# Patient Record
Sex: Female | Born: 1955 | Race: White | Hispanic: No | Marital: Married | Smoking: Current every day smoker
Health system: Southern US, Community
[De-identification: ages and names within clinical notes are randomized; demographics above are authoritative.]

## PROBLEM LIST (undated history)

## (undated) DIAGNOSIS — N921 Excessive and frequent menstruation with irregular cycle: Secondary | ICD-10-CM

## (undated) DIAGNOSIS — I10 Essential (primary) hypertension: Secondary | ICD-10-CM

## (undated) DIAGNOSIS — E876 Hypokalemia: Secondary | ICD-10-CM

## (undated) DIAGNOSIS — G459 Transient cerebral ischemic attack, unspecified: Secondary | ICD-10-CM

## (undated) DIAGNOSIS — E781 Pure hyperglyceridemia: Secondary | ICD-10-CM

## (undated) DIAGNOSIS — M1990 Unspecified osteoarthritis, unspecified site: Secondary | ICD-10-CM

## (undated) DIAGNOSIS — R51 Headache: Secondary | ICD-10-CM

## (undated) DIAGNOSIS — R112 Nausea with vomiting, unspecified: Secondary | ICD-10-CM

## (undated) DIAGNOSIS — Z9889 Other specified postprocedural states: Secondary | ICD-10-CM

## (undated) DIAGNOSIS — E669 Obesity, unspecified: Secondary | ICD-10-CM

## (undated) DIAGNOSIS — Z720 Tobacco use: Secondary | ICD-10-CM

## (undated) DIAGNOSIS — R519 Headache, unspecified: Secondary | ICD-10-CM

## (undated) DIAGNOSIS — C801 Malignant (primary) neoplasm, unspecified: Secondary | ICD-10-CM

## (undated) DIAGNOSIS — R609 Edema, unspecified: Secondary | ICD-10-CM

## (undated) DIAGNOSIS — N393 Stress incontinence (female) (male): Secondary | ICD-10-CM

## (undated) DIAGNOSIS — K279 Peptic ulcer, site unspecified, unspecified as acute or chronic, without hemorrhage or perforation: Secondary | ICD-10-CM

## (undated) HISTORY — DX: Tobacco use: Z72.0

## (undated) HISTORY — DX: Excessive and frequent menstruation with irregular cycle: N92.1

## (undated) HISTORY — PX: RADIOACTIVE SEED IMPLANT: SHX5150

## (undated) HISTORY — DX: Pure hyperglyceridemia: E78.1

## (undated) HISTORY — PX: CYST EXCISION: SHX5701

## (undated) HISTORY — DX: Edema, unspecified: R60.9

## (undated) HISTORY — PX: TUBAL LIGATION: SHX77

## (undated) HISTORY — DX: Essential (primary) hypertension: I10

## (undated) HISTORY — DX: Stress incontinence (female) (male): N39.3

## (undated) HISTORY — DX: Hypokalemia: E87.6

## (undated) HISTORY — PX: ANTERIOR CRUCIATE LIGAMENT REPAIR: SHX115

## (undated) HISTORY — DX: Obesity, unspecified: E66.9

## (undated) HISTORY — DX: Peptic ulcer, site unspecified, unspecified as acute or chronic, without hemorrhage or perforation: K27.9

---

## 2000-07-01 ENCOUNTER — Emergency Department (HOSPITAL_COMMUNITY): Admission: EM | Admit: 2000-07-01 | Discharge: 2000-07-01 | Payer: Self-pay | Admitting: Emergency Medicine

## 2001-10-14 ENCOUNTER — Other Ambulatory Visit: Admission: RE | Admit: 2001-10-14 | Discharge: 2001-10-14 | Payer: Self-pay | Admitting: Internal Medicine

## 2002-10-09 ENCOUNTER — Other Ambulatory Visit: Admission: RE | Admit: 2002-10-09 | Discharge: 2002-10-09 | Payer: Self-pay | Admitting: Internal Medicine

## 2002-10-16 ENCOUNTER — Encounter: Admission: RE | Admit: 2002-10-16 | Discharge: 2002-10-16 | Payer: Self-pay | Admitting: Internal Medicine

## 2002-10-16 ENCOUNTER — Encounter: Payer: Self-pay | Admitting: Internal Medicine

## 2003-05-31 HISTORY — PX: OTHER SURGICAL HISTORY: SHX169

## 2003-11-03 ENCOUNTER — Encounter: Admission: RE | Admit: 2003-11-03 | Discharge: 2003-11-03 | Payer: Self-pay | Admitting: Internal Medicine

## 2004-06-30 ENCOUNTER — Encounter: Admission: RE | Admit: 2004-06-30 | Discharge: 2004-06-30 | Payer: Self-pay | Admitting: Internal Medicine

## 2004-11-21 ENCOUNTER — Other Ambulatory Visit: Admission: RE | Admit: 2004-11-21 | Discharge: 2004-11-21 | Payer: Self-pay | Admitting: Internal Medicine

## 2004-11-22 ENCOUNTER — Ambulatory Visit (HOSPITAL_COMMUNITY): Admission: RE | Admit: 2004-11-22 | Discharge: 2004-11-22 | Payer: Self-pay | Admitting: Pediatrics

## 2006-01-14 ENCOUNTER — Other Ambulatory Visit: Admission: RE | Admit: 2006-01-14 | Discharge: 2006-01-14 | Payer: Self-pay | Admitting: Internal Medicine

## 2006-01-23 ENCOUNTER — Encounter: Admission: RE | Admit: 2006-01-23 | Discharge: 2006-01-23 | Payer: Self-pay | Admitting: Internal Medicine

## 2006-10-29 HISTORY — PX: OTHER SURGICAL HISTORY: SHX169

## 2006-11-11 ENCOUNTER — Inpatient Hospital Stay (HOSPITAL_COMMUNITY): Admission: EM | Admit: 2006-11-11 | Discharge: 2006-11-18 | Payer: Self-pay | Admitting: Emergency Medicine

## 2007-04-24 ENCOUNTER — Other Ambulatory Visit: Admission: RE | Admit: 2007-04-24 | Discharge: 2007-04-24 | Payer: Self-pay | Admitting: Internal Medicine

## 2007-05-22 ENCOUNTER — Encounter: Admission: RE | Admit: 2007-05-22 | Discharge: 2007-05-22 | Payer: Self-pay | Admitting: Internal Medicine

## 2008-04-27 ENCOUNTER — Other Ambulatory Visit: Admission: RE | Admit: 2008-04-27 | Discharge: 2008-04-27 | Payer: Self-pay | Admitting: Internal Medicine

## 2008-04-27 LAB — HM PAP SMEAR

## 2008-05-18 ENCOUNTER — Ambulatory Visit: Payer: Self-pay | Admitting: Internal Medicine

## 2008-06-07 ENCOUNTER — Encounter: Admission: RE | Admit: 2008-06-07 | Discharge: 2008-06-07 | Payer: Self-pay | Admitting: Internal Medicine

## 2008-11-09 ENCOUNTER — Ambulatory Visit: Payer: Self-pay | Admitting: Internal Medicine

## 2008-12-07 ENCOUNTER — Ambulatory Visit: Payer: Self-pay | Admitting: Internal Medicine

## 2009-02-01 ENCOUNTER — Ambulatory Visit: Payer: Self-pay | Admitting: Internal Medicine

## 2009-03-01 ENCOUNTER — Ambulatory Visit: Payer: Self-pay | Admitting: Internal Medicine

## 2009-03-28 ENCOUNTER — Ambulatory Visit: Payer: Self-pay | Admitting: Internal Medicine

## 2009-04-29 ENCOUNTER — Ambulatory Visit: Payer: Self-pay | Admitting: Internal Medicine

## 2009-11-07 ENCOUNTER — Ambulatory Visit: Payer: Self-pay | Admitting: Internal Medicine

## 2009-11-16 ENCOUNTER — Encounter: Admission: RE | Admit: 2009-11-16 | Discharge: 2009-11-16 | Payer: Self-pay | Admitting: Internal Medicine

## 2009-12-08 ENCOUNTER — Ambulatory Visit: Payer: Self-pay | Admitting: Internal Medicine

## 2010-03-20 ENCOUNTER — Ambulatory Visit: Payer: Self-pay | Admitting: Internal Medicine

## 2010-07-20 ENCOUNTER — Ambulatory Visit: Payer: Self-pay | Admitting: Internal Medicine

## 2010-07-27 ENCOUNTER — Ambulatory Visit: Payer: Self-pay | Admitting: Internal Medicine

## 2010-08-20 ENCOUNTER — Encounter: Payer: Self-pay | Admitting: Internal Medicine

## 2010-11-07 ENCOUNTER — Other Ambulatory Visit (INDEPENDENT_AMBULATORY_CARE_PROVIDER_SITE_OTHER): Payer: PRIVATE HEALTH INSURANCE | Admitting: Internal Medicine

## 2010-11-07 DIAGNOSIS — E785 Hyperlipidemia, unspecified: Secondary | ICD-10-CM

## 2010-11-07 DIAGNOSIS — E119 Type 2 diabetes mellitus without complications: Secondary | ICD-10-CM

## 2010-11-07 DIAGNOSIS — I10 Essential (primary) hypertension: Secondary | ICD-10-CM

## 2010-11-09 ENCOUNTER — Encounter: Payer: PRIVATE HEALTH INSURANCE | Admitting: Internal Medicine

## 2010-12-08 ENCOUNTER — Telehealth: Payer: Self-pay | Admitting: Internal Medicine

## 2010-12-08 DIAGNOSIS — K279 Peptic ulcer, site unspecified, unspecified as acute or chronic, without hemorrhage or perforation: Secondary | ICD-10-CM

## 2010-12-08 MED ORDER — PANTOPRAZOLE SODIUM 40 MG PO TBEC: 40.0000 mg | DELAYED_RELEASE_TABLET | Freq: Every day | ORAL | Status: DC

## 2010-12-08 NOTE — Telephone Encounter (Signed)
Medication reordered by MD

## 2011-01-05 ENCOUNTER — Other Ambulatory Visit: Payer: Self-pay | Admitting: Internal Medicine

## 2011-01-05 DIAGNOSIS — Z1231 Encounter for screening mammogram for malignant neoplasm of breast: Secondary | ICD-10-CM

## 2011-01-16 ENCOUNTER — Ambulatory Visit
Admission: RE | Admit: 2011-01-16 | Discharge: 2011-01-16 | Disposition: A | Discharge status: Home / Self Care | Payer: PRIVATE HEALTH INSURANCE | Source: Ambulatory Visit | Attending: Internal Medicine | Admitting: Internal Medicine

## 2011-01-16 DIAGNOSIS — Z1231 Encounter for screening mammogram for malignant neoplasm of breast: Secondary | ICD-10-CM

## 2011-01-19 ENCOUNTER — Other Ambulatory Visit: Payer: Self-pay | Admitting: Internal Medicine

## 2011-01-19 DIAGNOSIS — R928 Other abnormal and inconclusive findings on diagnostic imaging of breast: Secondary | ICD-10-CM

## 2011-01-26 ENCOUNTER — Ambulatory Visit
Admission: RE | Admit: 2011-01-26 | Discharge: 2011-01-26 | Disposition: A | Discharge status: Home / Self Care | Payer: PRIVATE HEALTH INSURANCE | Source: Ambulatory Visit | Attending: Internal Medicine | Admitting: Internal Medicine

## 2011-01-26 DIAGNOSIS — R928 Other abnormal and inconclusive findings on diagnostic imaging of breast: Secondary | ICD-10-CM

## 2011-04-05 ENCOUNTER — Other Ambulatory Visit: Payer: Self-pay | Admitting: Internal Medicine

## 2011-05-09 ENCOUNTER — Encounter: Payer: Self-pay | Admitting: Internal Medicine

## 2011-05-14 ENCOUNTER — Other Ambulatory Visit: Payer: PRIVATE HEALTH INSURANCE | Admitting: Internal Medicine

## 2011-05-14 DIAGNOSIS — E785 Hyperlipidemia, unspecified: Secondary | ICD-10-CM

## 2011-05-14 DIAGNOSIS — E119 Type 2 diabetes mellitus without complications: Secondary | ICD-10-CM

## 2011-05-14 LAB — HEMOGLOBIN A1C
Hgb A1c MFr Bld: 5.5 % (ref <5.7)
Mean Plasma Glucose: 111 mg/dL (ref <117)

## 2011-05-14 LAB — HEPATIC FUNCTION PANEL
ALT: 39 U/L — ABNORMAL HIGH (ref 0–35)
Albumin: 4.2 g/dL (ref 3.5–5.2)
Bilirubin, Direct: 0.1 mg/dL (ref 0.0–0.3)
Total Bilirubin: 0.6 mg/dL (ref 0.3–1.2)
Total Protein: 6.8 g/dL (ref 6.0–8.3)

## 2011-05-14 LAB — LIPID PANEL
Cholesterol: 141 mg/dL (ref 0–200)
LDL Cholesterol: 78 mg/dL (ref 0–99)
Triglycerides: 132 mg/dL (ref <150)

## 2011-05-15 ENCOUNTER — Ambulatory Visit (INDEPENDENT_AMBULATORY_CARE_PROVIDER_SITE_OTHER): Payer: PRIVATE HEALTH INSURANCE | Admitting: Internal Medicine

## 2011-05-15 ENCOUNTER — Encounter: Payer: Self-pay | Admitting: Internal Medicine

## 2011-05-15 DIAGNOSIS — R7302 Impaired glucose tolerance (oral): Secondary | ICD-10-CM

## 2011-05-15 DIAGNOSIS — I10 Essential (primary) hypertension: Secondary | ICD-10-CM

## 2011-05-15 DIAGNOSIS — Z8711 Personal history of peptic ulcer disease: Secondary | ICD-10-CM

## 2011-05-15 DIAGNOSIS — E785 Hyperlipidemia, unspecified: Secondary | ICD-10-CM

## 2011-05-15 DIAGNOSIS — R7309 Other abnormal glucose: Secondary | ICD-10-CM

## 2011-05-15 DIAGNOSIS — E669 Obesity, unspecified: Secondary | ICD-10-CM

## 2011-05-27 DIAGNOSIS — E669 Obesity, unspecified: Secondary | ICD-10-CM | POA: Insufficient documentation

## 2011-05-27 DIAGNOSIS — E785 Hyperlipidemia, unspecified: Secondary | ICD-10-CM | POA: Insufficient documentation

## 2011-05-27 DIAGNOSIS — Z8711 Personal history of peptic ulcer disease: Secondary | ICD-10-CM | POA: Insufficient documentation

## 2011-05-27 DIAGNOSIS — I10 Essential (primary) hypertension: Secondary | ICD-10-CM | POA: Insufficient documentation

## 2011-05-27 DIAGNOSIS — R7302 Impaired glucose tolerance (oral): Secondary | ICD-10-CM | POA: Insufficient documentation

## 2011-05-27 NOTE — Patient Instructions (Signed)
Try to continue to diet and exercise and lose some weight. You may contact [facility] regarding their bariatric surgery program. Continue same medications.

## 2011-05-27 NOTE — Progress Notes (Signed)
  Subjective:    Patient ID: Gabriella [name], female    DOB: 02/19/1956, 55 y.o.   MRN: [id number]  HPI 55 year old married white female with history of obesity, hypertension, hyperlipidemia, impaired glucose tolerance for six-month recheck. She's asking about bariatric surgery. Says she's been unable to lose weight with diet. Doesn't exercise a great deal. 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 Has always been sedentary since she's been coming to this office. Recent lipid panel, liver functions and hemoglobin A1c are all quite good. These were discussed with her today. However she has many questions about area tricks surgery. Explained to her she would need contact [facility] and sign up for seminars.    Review of Systems     Objective:   Physical Exam chest clear; cardiac exam regular rate and rhythm; extremities without edema        Assessment & Plan:  Hypertension  Hyperlipidemia  Impaired glucose tolerance  Obesity  History of peptic ulcer disease- status post perforated ulcer requiring surgery by Dr. Zachery Dakins  Plan return in 6 months for physical examination, evaluation of medical problems and fasting lab work

## 2011-11-06 ENCOUNTER — Other Ambulatory Visit: Payer: Self-pay | Admitting: Internal Medicine

## 2011-11-07 ENCOUNTER — Other Ambulatory Visit: Payer: Self-pay

## 2011-11-07 MED ORDER — HYDROCODONE-ACETAMINOPHEN 5-500 MG PO TABS: 1.0000 | ORAL_TABLET | Freq: Four times a day (QID) | ORAL | Status: AC | PRN

## 2011-11-19 ENCOUNTER — Other Ambulatory Visit: Payer: PRIVATE HEALTH INSURANCE | Admitting: Internal Medicine

## 2011-11-19 DIAGNOSIS — E785 Hyperlipidemia, unspecified: Secondary | ICD-10-CM

## 2011-11-19 DIAGNOSIS — E559 Vitamin D deficiency, unspecified: Secondary | ICD-10-CM

## 2011-11-19 DIAGNOSIS — I10 Essential (primary) hypertension: Secondary | ICD-10-CM

## 2011-11-19 DIAGNOSIS — Z0000 Encounter for general adult medical examination without abnormal findings: Secondary | ICD-10-CM

## 2011-11-19 DIAGNOSIS — R7302 Impaired glucose tolerance (oral): Secondary | ICD-10-CM

## 2011-11-19 LAB — CBC WITH DIFFERENTIAL/PLATELET
Basophils Relative: 1 % (ref 0–1)
Eosinophils Absolute: 0.1 10*3/uL (ref 0.0–0.7)
Eosinophils Relative: 1 % (ref 0–5)
Lymphocytes Relative: 42 % (ref 12–46)
MCH: 32.5 pg (ref 26.0–34.0)
MCHC: 33.4 g/dL (ref 30.0–36.0)
Monocytes Absolute: 0.6 10*3/uL (ref 0.1–1.0)
Neutrophils Relative %: 47 % (ref 43–77)
Platelets: 221 10*3/uL (ref 150–400)
WBC: 6.5 10*3/uL (ref 4.0–10.5)

## 2011-11-19 LAB — COMPREHENSIVE METABOLIC PANEL
BUN: 18 mg/dL (ref 6–23)
CO2: 30 mEq/L (ref 19–32)
Chloride: 103 mEq/L (ref 96–112)
Sodium: 142 mEq/L (ref 135–145)

## 2011-11-19 LAB — TSH: TSH: 1.245 u[IU]/mL (ref 0.350–4.500)

## 2011-11-19 LAB — LIPID PANEL
Cholesterol: 155 mg/dL (ref 0–200)
Triglycerides: 132 mg/dL (ref <150)

## 2011-11-20 ENCOUNTER — Encounter: Payer: PRIVATE HEALTH INSURANCE | Admitting: Internal Medicine

## 2011-11-20 LAB — VITAMIN D 25 HYDROXY (VIT D DEFICIENCY, FRACTURES): Vit D, 25-Hydroxy: 38 ng/mL (ref 30–89)

## 2011-12-13 ENCOUNTER — Other Ambulatory Visit: Payer: Self-pay | Admitting: Internal Medicine

## 2011-12-31 ENCOUNTER — Other Ambulatory Visit: Payer: Self-pay | Admitting: Internal Medicine

## 2011-12-31 DIAGNOSIS — Z1231 Encounter for screening mammogram for malignant neoplasm of breast: Secondary | ICD-10-CM

## 2012-01-07 ENCOUNTER — Ambulatory Visit (INDEPENDENT_AMBULATORY_CARE_PROVIDER_SITE_OTHER): Payer: PRIVATE HEALTH INSURANCE | Admitting: Internal Medicine

## 2012-01-07 ENCOUNTER — Other Ambulatory Visit (HOSPITAL_COMMUNITY)
Admission: RE | Admit: 2012-01-07 | Discharge: 2012-01-07 | Disposition: A | Discharge status: Home / Self Care | Payer: PRIVATE HEALTH INSURANCE | Source: Ambulatory Visit | Attending: Internal Medicine | Admitting: Internal Medicine

## 2012-01-07 ENCOUNTER — Encounter: Payer: Self-pay | Admitting: Internal Medicine

## 2012-01-07 VITALS — BP 144/88 | HR 68 | Resp 18 | Ht 64.0 in | Wt 255.0 lb

## 2012-01-07 DIAGNOSIS — Z0000 Encounter for general adult medical examination without abnormal findings: Secondary | ICD-10-CM

## 2012-01-07 DIAGNOSIS — Z01419 Encounter for gynecological examination (general) (routine) without abnormal findings: Secondary | ICD-10-CM | POA: Insufficient documentation

## 2012-01-07 DIAGNOSIS — R7309 Other abnormal glucose: Secondary | ICD-10-CM

## 2012-01-07 DIAGNOSIS — Z23 Encounter for immunization: Secondary | ICD-10-CM

## 2012-01-07 DIAGNOSIS — Z1272 Encounter for screening for malignant neoplasm of vagina: Secondary | ICD-10-CM

## 2012-01-07 LAB — POCT URINALYSIS DIPSTICK
Ketones, UA: NEGATIVE
Urobilinogen, UA: 1
pH, UA: 6.5

## 2012-03-31 ENCOUNTER — Encounter: Payer: Self-pay | Admitting: Internal Medicine

## 2012-03-31 NOTE — Patient Instructions (Addendum)
Consider colonoscopy. Gets annual mammogram. Consider bone density study. Gets annual influenza immunization. Return in 6 months.

## 2012-03-31 NOTE — Progress Notes (Signed)
Subjective:    Patient ID: Gabriella [name], female    DOB: 02-Sep-1955, 56 y.o.   MRN: [id number]  HPI  56 year old obese white female with history of hypertension, hyperlipidemia, impaired glucose tolerance, history of peptic ulcer disease in today for health maintenance and evaluation of medical problems. She has lost 15 pounds with diet and exercise. Patient compliant with medications and has done a good job controlling glucose intolerance.  No known drug allergies  History of smoking  Hospitalized for perforated duodenal ulcer requiring surgery April 2008. History of debridement of anterior cruciate ligament right knee November 2004 [facility] in  [place].  History of vitamin D deficiency. Last mammogram on file April 2011. Tetanus immunization done 10/14/2001. Pneumovax immunization done in may 2011.  History of recurrent urinary tract infections. Had gout April 2010. Had basal cell carcinoma of right upper d December 2008.  Patient is married to [employer] who owns a trucking business and a farm. She helps run a trucking business. They are raising her daughter son who has attention deficit disorder. Daughter had issues with substance abuse. Daughter was patient's only child.  Family history: Father with history of lung cancer. Mother with history of hypertension and dementia. Total of 5 brothers. Younger 2 brothers have history of heart murmurs.   This is patient's second marriage. Has smoked for over 30 years. No alcohol consumption. Patient is a native of [place], [place].    Review of Systems  Constitutional: Positive for fatigue.  HENT: Negative.   Eyes: Negative.   Respiratory: Negative.   Cardiovascular: Negative.   Gastrointestinal:       Intermittent reflux  Genitourinary: Positive for dysuria.  Musculoskeletal: Positive for arthralgias.  Neurological: Negative.   Hematological: Negative.   Psychiatric/Behavioral: Negative.        Objective:   Physical Exam    Vitals reviewed. Constitutional: She is oriented to person, place, and time. She appears well-developed and well-nourished. No distress.  HENT:  Head: Normocephalic and atraumatic.  Right Ear: External ear normal.  Left Ear: External ear normal.  Mouth/Throat: Oropharynx is clear and moist. No oropharyngeal exudate.  Eyes: Conjunctivae and EOM are normal. Pupils are equal, round, and reactive to light. Right eye exhibits no discharge. Left eye exhibits no discharge.  Neck: Neck supple. No JVD present. No thyromegaly present.  Cardiovascular: Normal rate, regular rhythm, normal heart sounds and intact distal pulses.   No murmur heard. Pulmonary/Chest: Effort normal and breath sounds normal. She has no wheezes. She has no rales.       Breasts normal female  Abdominal: Soft. Bowel sounds are normal. She exhibits no distension and no mass. There is no tenderness. There is no rebound and no guarding.  Musculoskeletal: She exhibits no edema.  Lymphadenopathy:    She has no cervical adenopathy.  Neurological: She is alert and oriented to person, place, and time. She has normal reflexes. No cranial nerve deficit.  Skin: Skin is warm and dry. No rash noted. She is not diaphoretic.  Psychiatric: She has a normal mood and affect. Her behavior is normal. Judgment and thought content normal.          Assessment & Plan:  Obesity-pleased with 15 pound weight loss  History of peptic ulcer disease  Hyperlipidemia  Hypokalemia secondary to diuretic therapy  History of vitamin D deficiency  Impaired glucose tolerance  Hypertension  History of dependent edema  History of smoking  History of peptic ulcer disease with perforated duodenal ulcer 2008 -  needs to stay on PPI therapy  Plan: Renew antihypertensive medications. Return in 6 months for office visit and lipid panel liver functions and hemoglobin A1c with office visit blood pressure check. Recommend annual mammogram. Recommended  annual influenza immunization. Last eye exam 2011 in [place] in [place]. Recommended annual eye exam. Needs to have screening colonoscopy.

## 2012-04-14 ENCOUNTER — Other Ambulatory Visit: Payer: Self-pay | Admitting: Internal Medicine

## 2012-04-21 ENCOUNTER — Ambulatory Visit
Admission: RE | Admit: 2012-04-21 | Discharge: 2012-04-21 | Disposition: A | Discharge status: Home / Self Care | Payer: PRIVATE HEALTH INSURANCE | Source: Ambulatory Visit | Attending: Internal Medicine | Admitting: Internal Medicine

## 2012-04-21 DIAGNOSIS — Z1231 Encounter for screening mammogram for malignant neoplasm of breast: Secondary | ICD-10-CM

## 2012-07-07 ENCOUNTER — Other Ambulatory Visit: Payer: PRIVATE HEALTH INSURANCE | Admitting: Internal Medicine

## 2012-07-08 ENCOUNTER — Ambulatory Visit: Payer: PRIVATE HEALTH INSURANCE | Admitting: Internal Medicine

## 2012-07-08 ENCOUNTER — Other Ambulatory Visit: Payer: PRIVATE HEALTH INSURANCE | Admitting: Internal Medicine

## 2012-07-08 DIAGNOSIS — E785 Hyperlipidemia, unspecified: Secondary | ICD-10-CM

## 2012-07-08 DIAGNOSIS — Z79899 Other long term (current) drug therapy: Secondary | ICD-10-CM

## 2012-07-08 DIAGNOSIS — E119 Type 2 diabetes mellitus without complications: Secondary | ICD-10-CM

## 2012-07-08 LAB — LIPID PANEL
HDL: 41 mg/dL (ref >39)
LDL Cholesterol: 88 mg/dL (ref 0–99)
Total CHOL/HDL Ratio: 3.9 Ratio
VLDL: 29 mg/dL (ref 0–40)

## 2012-07-08 LAB — HEPATIC FUNCTION PANEL
Albumin: 4.2 g/dL (ref 3.5–5.2)
Total Bilirubin: 0.6 mg/dL (ref 0.3–1.2)

## 2012-07-08 LAB — HEMOGLOBIN A1C: Hgb A1c MFr Bld: 5.5 % (ref <5.7)

## 2012-07-14 ENCOUNTER — Ambulatory Visit (INDEPENDENT_AMBULATORY_CARE_PROVIDER_SITE_OTHER): Payer: PRIVATE HEALTH INSURANCE | Admitting: Internal Medicine

## 2012-07-14 ENCOUNTER — Encounter: Payer: Self-pay | Admitting: Internal Medicine

## 2012-07-14 VITALS — BP 136/68 | HR 76 | Temp 98.8°F | Wt 259.0 lb

## 2012-07-14 DIAGNOSIS — R609 Edema, unspecified: Secondary | ICD-10-CM

## 2012-07-14 DIAGNOSIS — R600 Localized edema: Secondary | ICD-10-CM

## 2012-07-14 DIAGNOSIS — M25579 Pain in unspecified ankle and joints of unspecified foot: Secondary | ICD-10-CM

## 2012-07-14 DIAGNOSIS — R202 Paresthesia of skin: Secondary | ICD-10-CM

## 2012-07-14 DIAGNOSIS — M79609 Pain in unspecified limb: Secondary | ICD-10-CM

## 2012-07-14 DIAGNOSIS — M7660 Achilles tendinitis, unspecified leg: Secondary | ICD-10-CM

## 2012-07-14 DIAGNOSIS — I10 Essential (primary) hypertension: Secondary | ICD-10-CM

## 2012-07-14 DIAGNOSIS — R209 Unspecified disturbances of skin sensation: Secondary | ICD-10-CM

## 2012-07-14 DIAGNOSIS — E785 Hyperlipidemia, unspecified: Secondary | ICD-10-CM

## 2012-07-14 DIAGNOSIS — E119 Type 2 diabetes mellitus without complications: Secondary | ICD-10-CM

## 2012-07-14 DIAGNOSIS — Z23 Encounter for immunization: Secondary | ICD-10-CM

## 2012-07-14 DIAGNOSIS — E669 Obesity, unspecified: Secondary | ICD-10-CM

## 2012-07-14 DIAGNOSIS — M79673 Pain in unspecified foot: Secondary | ICD-10-CM

## 2012-07-14 NOTE — Progress Notes (Signed)
  Subjective:    Patient ID: Gabriella [name], female    DOB: [birth year], 56 y.o.   MRN: [id number]  HPI Six-month followup today of hypertension, hyperlipidemia, type 2 diabetes mellitus. Weight has increased 4 pounds from this past summer. Diabetes is currently diet controlled. Patient is on medication for hypertension and hyperlipidemia. No new complaints or problems. 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 Brand is called [employer].  Patient brings up a new problem today with her foot. She wants to have evaluation done before end of the year because of deductible. Seems to have issues with Achilles tendon and possible plantar fasciitis. Has seen orthopedist regarding foot in the past but continues to have pain.    Review of Systems     Objective:   Physical Exam skin is warm and dry. Neck is supple without JVD thyromegaly or carotid bruits. Chest clear to auscultation. Cardiac exam regular rate and rhythm normal S1 and S2. Extremities without edema.        Assessment & Plan:  Hypertension-under good control  Hyperlipidemia-stable  Type 2 diabetes mellitus-stable with diet  Foot pain-seems to have issues with Achilles tendinitis and also perhaps plantar fasciitis. Patient wants MRI done of her foot. It will be difficult to get this before the end of the year and insurance coming may not aprove it. She may need to get back and see orthopedist.  Otherwise return in 6 months for physical examination.

## 2012-07-15 NOTE — Patient Instructions (Addendum)
Patient scheduled for an MRI of left foot and ankle on 07/19/2012 at 9:00 am. Will need pre-cert. Patient aware of this. Addendum: Could not get MRI approved. Return to see orthopedist. Return in 6 months for physical exam.

## 2012-07-19 ENCOUNTER — Other Ambulatory Visit: Payer: PRIVATE HEALTH INSURANCE

## 2012-08-31 DIAGNOSIS — M79673 Pain in unspecified foot: Secondary | ICD-10-CM | POA: Insufficient documentation

## 2012-08-31 DIAGNOSIS — M7660 Achilles tendinitis, unspecified leg: Secondary | ICD-10-CM | POA: Insufficient documentation

## 2012-10-27 ENCOUNTER — Telehealth: Payer: Self-pay | Admitting: Internal Medicine

## 2012-10-27 ENCOUNTER — Other Ambulatory Visit: Payer: Self-pay

## 2012-10-27 MED ORDER — SIMVASTATIN 20 MG PO TABS: 20.0000 mg | ORAL_TABLET | Freq: Every day | ORAL | Status: DC

## 2012-10-27 MED ORDER — FUROSEMIDE 40 MG PO TABS: 40.0000 mg | ORAL_TABLET | Freq: Every day | ORAL | Status: DC

## 2012-10-27 MED ORDER — AMLODIPINE BESYLATE 10 MG PO TABS: 10.0000 mg | ORAL_TABLET | Freq: Every day | ORAL | Status: DC

## 2012-10-27 MED ORDER — HYDROCODONE-ACETAMINOPHEN 5-325 MG PO TABS
1.0000 | ORAL_TABLET | Freq: Four times a day (QID) | ORAL | Status: DC | PRN
Start: 2012-10-27 — End: 2013-10-13

## 2012-10-27 MED ORDER — ALLOPURINOL 300 MG PO TABS: 300.0000 mg | ORAL_TABLET | Freq: Every day | ORAL | Status: DC

## 2012-10-27 NOTE — Telephone Encounter (Signed)
Please refill- thought this had been done

## 2012-10-27 NOTE — Telephone Encounter (Signed)
Patient called back to request 3 refills (states that is what she usually gets on all of her meds).  Thanks.

## 2012-11-26 ENCOUNTER — Other Ambulatory Visit: Payer: Self-pay | Admitting: Internal Medicine

## 2012-11-27 ENCOUNTER — Other Ambulatory Visit: Payer: Self-pay

## 2012-11-27 MED ORDER — PANTOPRAZOLE SODIUM 40 MG PO TBEC: 40.0000 mg | DELAYED_RELEASE_TABLET | Freq: Every day | ORAL | Status: DC

## 2013-01-13 ENCOUNTER — Other Ambulatory Visit: Payer: PRIVATE HEALTH INSURANCE | Admitting: Internal Medicine

## 2013-01-13 DIAGNOSIS — E785 Hyperlipidemia, unspecified: Secondary | ICD-10-CM

## 2013-01-13 DIAGNOSIS — I10 Essential (primary) hypertension: Secondary | ICD-10-CM

## 2013-01-13 DIAGNOSIS — R7301 Impaired fasting glucose: Secondary | ICD-10-CM

## 2013-01-13 DIAGNOSIS — Z79899 Other long term (current) drug therapy: Secondary | ICD-10-CM

## 2013-01-13 DIAGNOSIS — R030 Elevated blood-pressure reading, without diagnosis of hypertension: Secondary | ICD-10-CM

## 2013-01-13 DIAGNOSIS — Z0000 Encounter for general adult medical examination without abnormal findings: Secondary | ICD-10-CM

## 2013-01-13 LAB — LIPID PANEL
LDL Cholesterol: 91 mg/dL (ref 0–99)
Triglycerides: 172 mg/dL — ABNORMAL HIGH (ref <150)

## 2013-01-13 LAB — HEMOGLOBIN A1C
Hgb A1c MFr Bld: 5.9 % — ABNORMAL HIGH (ref <5.7)
Mean Plasma Glucose: 123 mg/dL — ABNORMAL HIGH (ref <117)

## 2013-01-13 LAB — COMPREHENSIVE METABOLIC PANEL
Albumin: 3.9 g/dL (ref 3.5–5.2)
Alkaline Phosphatase: 91 U/L (ref 39–117)
BUN: 21 mg/dL (ref 6–23)
Calcium: 9 mg/dL (ref 8.4–10.5)
Chloride: 105 mEq/L (ref 96–112)
Glucose, Bld: 116 mg/dL — ABNORMAL HIGH (ref 70–99)
Potassium: 3.3 mEq/L — ABNORMAL LOW (ref 3.5–5.3)
Sodium: 140 mEq/L (ref 135–145)
Total Protein: 6.6 g/dL (ref 6.0–8.3)

## 2013-01-13 LAB — CBC WITH DIFFERENTIAL/PLATELET
Basophils Relative: 0 % (ref 0–1)
HCT: 47 % — ABNORMAL HIGH (ref 36.0–46.0)
Hemoglobin: 16.1 g/dL — ABNORMAL HIGH (ref 12.0–15.0)
Lymphocytes Relative: 38 % (ref 12–46)
Lymphs Abs: 2.3 10*3/uL (ref 0.7–4.0)
Monocytes Absolute: 0.5 10*3/uL (ref 0.1–1.0)
Monocytes Relative: 8 % (ref 3–12)
Neutro Abs: 3.2 10*3/uL (ref 1.7–7.7)
Neutrophils Relative %: 53 % (ref 43–77)
RBC: 4.95 MIL/uL (ref 3.87–5.11)
WBC: 6 10*3/uL (ref 4.0–10.5)

## 2013-01-13 LAB — TSH: TSH: 1.64 u[IU]/mL (ref 0.350–4.500)

## 2013-01-15 ENCOUNTER — Ambulatory Visit: Payer: PRIVATE HEALTH INSURANCE | Admitting: Internal Medicine

## 2013-02-02 ENCOUNTER — Telehealth: Payer: Self-pay | Admitting: Internal Medicine

## 2013-02-04 ENCOUNTER — Other Ambulatory Visit: Payer: Self-pay

## 2013-02-04 MED ORDER — GLUCOSE BLOOD VI STRP: ORAL_STRIP | Status: DC

## 2013-02-05 ENCOUNTER — Other Ambulatory Visit: Payer: Self-pay

## 2013-02-05 MED ORDER — GLUCOSE BLOOD VI STRP: ORAL_STRIP | Status: DC

## 2013-02-05 NOTE — Telephone Encounter (Signed)
Linda mis-read note and called in strips for patient's mother.  She will contact pharmacy and correct the information and have it on this patient's script for the glucose monitoring strips.

## 2013-02-10 ENCOUNTER — Other Ambulatory Visit: Payer: Self-pay

## 2013-02-16 ENCOUNTER — Ambulatory Visit (INDEPENDENT_AMBULATORY_CARE_PROVIDER_SITE_OTHER): Payer: BC Managed Care – PPO | Admitting: Internal Medicine

## 2013-02-16 ENCOUNTER — Encounter: Payer: Self-pay | Admitting: Internal Medicine

## 2013-02-16 VITALS — BP 146/84 | HR 84 | Temp 97.7°F | Ht 64.0 in | Wt 253.0 lb

## 2013-02-16 DIAGNOSIS — R809 Proteinuria, unspecified: Secondary | ICD-10-CM

## 2013-02-16 DIAGNOSIS — E785 Hyperlipidemia, unspecified: Secondary | ICD-10-CM

## 2013-02-16 DIAGNOSIS — E8881 Metabolic syndrome: Secondary | ICD-10-CM

## 2013-02-16 DIAGNOSIS — E876 Hypokalemia: Secondary | ICD-10-CM

## 2013-02-16 DIAGNOSIS — E669 Obesity, unspecified: Secondary | ICD-10-CM

## 2013-02-16 DIAGNOSIS — I10 Essential (primary) hypertension: Secondary | ICD-10-CM

## 2013-02-16 DIAGNOSIS — R7309 Other abnormal glucose: Secondary | ICD-10-CM

## 2013-02-16 DIAGNOSIS — Z0000 Encounter for general adult medical examination without abnormal findings: Secondary | ICD-10-CM

## 2013-02-16 DIAGNOSIS — R7302 Impaired glucose tolerance (oral): Secondary | ICD-10-CM

## 2013-02-16 LAB — POCT URINALYSIS DIPSTICK
Ketones, UA: NEGATIVE
Leukocytes, UA: NEGATIVE
Urobilinogen, UA: NEGATIVE

## 2013-02-16 MED ORDER — LISINOPRIL 10 MG PO TABS: 10.0000 mg | ORAL_TABLET | Freq: Every day | ORAL | Status: DC

## 2013-02-16 MED ORDER — POTASSIUM CHLORIDE CRYS ER 20 MEQ PO TBCR: 20.0000 meq | EXTENDED_RELEASE_TABLET | Freq: Three times a day (TID) | ORAL | Status: DC

## 2013-02-16 NOTE — Patient Instructions (Addendum)
Increase potassium supplement to 3 times daily. At the sun up real 10 mg daily to antihypertensive regimen. Continue diet and exercise. Return in 6 weeks.

## 2013-02-16 NOTE — Addendum Note (Signed)
Addended by: Judy Pimple on: 02/16/2013 12:53 PM   Modules accepted: Orders

## 2013-02-16 NOTE — Progress Notes (Signed)
Subjective:    Patient ID: Gabriella [name], female    DOB: 06-Feb-1956, 57 y.o.   MRN: [id number]  HPI 57 year old White female for  health maintenance and evaluation of medical problems. History of hypertension, hyperlipidemia, impaired glucose tolerance, metabolic syndrome, obesity peptic ulcer disease.   Patient compliant with medication and has done a good job controlling glucose.  No known drug allergies  History of smoking. She has smoked for over 30 years. No alcohol consumption.  Never had colonoscopy-this was discussed today.  History of vitamin D deficiency but vitamin D level is normal this year.  Reminded about mammogram. Last one on file April 2011.  Pneumovax immunization done in May 2011  Hospitalized for perforated duodenal ulcer requiring surgery April 2008. History of debridement anterior cruciate ligament right knee November 2004 at [facility] in [place].  Social history: This is her second marriage. She is a native of [place], [place]. 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 They're raising her daughter son who has attention deficit disorder. Daughter has had issues with substance abuse. Patient only had one daughter. Her mother has Alzheimer's disease and is taking a lot of her time at her mother-in-law has Alzheimer's disease as well. She says she is stressed all of this.  Family history: Father with history of lung cancer. Mother with history of hypertension and dementia. Total of 5 brothers. Younger 2 brothers have history of heart murmurs.   Review of Systems  Constitutional: Positive for fatigue.  HENT: Negative.   Eyes: Negative.   Respiratory: Negative.   Cardiovascular: Negative.   Gastrointestinal: Negative.   Endocrine: Negative.   Musculoskeletal:       Left ankle pain and swelling. She is considering seeing orthopedist about this. Thinks it's arthritis.  Skin: Negative.    Allergic/Immunologic: Negative.   Neurological: Negative.   Hematological: Negative.   Psychiatric/Behavioral: Negative.        Objective:   Physical Exam  Vitals reviewed. Constitutional: She is oriented to person, place, and time. She appears well-developed and well-nourished. No distress.  HENT:  Head: Normocephalic and atraumatic.  Right Ear: External ear normal.  Left Ear: External ear normal.  Mouth/Throat: No oropharyngeal exudate.  Eyes: Conjunctivae and EOM are normal. Pupils are equal, round, and reactive to light. Right eye exhibits no discharge. Left eye exhibits no discharge. No scleral icterus.  Neck: Neck supple. No JVD present. No thyromegaly present.  Cardiovascular: Normal rate, regular rhythm, normal heart sounds and intact distal pulses.   No murmur heard. Pulmonary/Chest: Effort normal and breath sounds normal. She has no wheezes. She has no rales.  Abdominal: Soft. Bowel sounds are normal. She exhibits no distension and no mass. There is no tenderness. There is no rebound and no guarding.  Genitourinary:  Deferred. Pap smear not due this year  Musculoskeletal: Normal range of motion. She exhibits no edema.  Lymphadenopathy:    She has no cervical adenopathy.  Neurological: She is alert and oriented to person, place, and time. She has normal reflexes. She displays normal reflexes. No cranial nerve deficit. Coordination normal.  Skin: Skin is warm and dry. No rash noted. She is not diaphoretic.  Psychiatric: She has a normal mood and affect. Her behavior is normal. Judgment and thought content normal.          Assessment & Plan:  Impaired glucose tolerance-stable  History of vitamin D deficiency-vitamin D level  normal  Hypokalemia secondary to diuretic therapy. Increase potassium supplement from twice a day to 3 times a day   Hypertension-blood pressure could be under better control. Add lisinopril 10 mg daily 2 amlodipine and Lasix. Return in 6 weeks  for office visit and basic metabolic panel  Health maintenance--discussed colonoscopy and mammogram . Tdap given 2013  Hyperlipidemia -- stable on statin therapy  History of left ankle pain  Obesity-continue diet and exercise  Plan: Return in 6 weeks for office visit and basic metabolic panel with blood pressure check having added lisinopril 10 mg daily today to blood pressure regimen

## 2013-02-17 LAB — MICROALBUMIN, URINE: Microalb, Ur: 153.37 mg/dL — ABNORMAL HIGH (ref 0.00–1.89)

## 2013-02-22 ENCOUNTER — Other Ambulatory Visit: Payer: Self-pay | Admitting: Internal Medicine

## 2013-03-31 ENCOUNTER — Ambulatory Visit (INDEPENDENT_AMBULATORY_CARE_PROVIDER_SITE_OTHER): Payer: BC Managed Care – PPO | Admitting: Internal Medicine

## 2013-03-31 ENCOUNTER — Encounter: Payer: Self-pay | Admitting: Internal Medicine

## 2013-03-31 VITALS — BP 170/86 | HR 84 | Wt 254.0 lb

## 2013-03-31 DIAGNOSIS — I10 Essential (primary) hypertension: Secondary | ICD-10-CM

## 2013-03-31 NOTE — Patient Instructions (Addendum)
Increase lisinopril to 20 mg daily and return for blood pressure check in one week

## 2013-03-31 NOTE — Progress Notes (Signed)
  Subjective:    Patient ID: Gabriella [name], female    DOB: 04-05-1956, 57 y.o.   MRN: [id number]  HPI  At last visit was placed on lisinopril 10 mg daily in addition to Lasix and amlodipine 10 mg daily. She is to take Ziac and Avapro for hypertension. Blood pressure is markedly elevated today but she awakened with a headache and hasn't felt that well today. Has been very busy at work. Blood pressure is even higher today than it was at last visit. It is ranging 170-180 systolically. At last visit blood pressure was 146 systolically at time of physical exam in July.    Review of Systems     Objective:   Physical Exam not examined today. Spoke with her about blood pressure management. We will reassess in one week. She will increase lisinopril to 20 mg daily. Spent 15 minutes with patient. 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 She has hydrocodone/APAP to take for pain. Unable to take NSAIDs because of history of peptic ulcer disease.        Assessment & Plan:  Hypertension-need to improve control. Increase lisinopril to 20 mg daily, continue amlodipine and Lasix. Reassess in one week. Basic metabolic panel drawn today on lisinopril.  Headache-May take hydrocodone/APAP when she gets home for headache today  Musculoskeletal pain-take hydrocodone/APAP sparingly

## 2013-04-01 LAB — BASIC METABOLIC PANEL
Calcium: 9.2 mg/dL (ref 8.4–10.5)
Creat: 0.62 mg/dL (ref 0.50–1.10)

## 2013-04-07 ENCOUNTER — Ambulatory Visit (INDEPENDENT_AMBULATORY_CARE_PROVIDER_SITE_OTHER): Payer: BC Managed Care – PPO | Admitting: Internal Medicine

## 2013-04-07 VITALS — BP 170/100

## 2013-04-07 DIAGNOSIS — I10 Essential (primary) hypertension: Secondary | ICD-10-CM

## 2013-04-07 MED ORDER — LISINOPRIL 20 MG PO TABS: 20.0000 mg | ORAL_TABLET | Freq: Every day | ORAL | Status: DC

## 2013-04-07 MED ORDER — METOPROLOL SUCCINATE ER 50 MG PO TB24: 50.0000 mg | ORAL_TABLET | Freq: Every day | ORAL | Status: DC

## 2013-04-07 NOTE — Patient Instructions (Addendum)
And metoprolol the current blood pressure regimen and return in 2 weeks

## 2013-04-07 NOTE — Progress Notes (Signed)
  Subjective:    Patient ID: Gabriella [name], female    DOB: 1956-02-26, 57 y.o.   MRN: [id number]  HPI  In today for blood pressure check. She is on Prinivil 20 mg daily, Lasix, amlodipine. Blood pressure remains elevated. Previously took Oceanographer and Avapro.    Review of Systems     Objective:   Physical Exam Chest clear to auscultation. Cardiac exam regular rate and rhythm normal S1 and S2. Extremities without edema.        Assessment & Plan:  Essential hypertension  Plan: Add metoprolol 50 mg daily return in 2-4 weeks.

## 2013-05-14 ENCOUNTER — Ambulatory Visit: Payer: BC Managed Care – PPO | Admitting: Internal Medicine

## 2013-05-14 VITALS — BP 138/98

## 2013-05-14 DIAGNOSIS — I10 Essential (primary) hypertension: Secondary | ICD-10-CM

## 2013-05-19 ENCOUNTER — Ambulatory Visit (INDEPENDENT_AMBULATORY_CARE_PROVIDER_SITE_OTHER): Payer: BC Managed Care – PPO | Admitting: Internal Medicine

## 2013-05-19 ENCOUNTER — Encounter: Payer: Self-pay | Admitting: Internal Medicine

## 2013-05-19 VITALS — BP 144/88 | HR 80 | Temp 98.2°F | Wt 256.0 lb

## 2013-05-19 DIAGNOSIS — H6690 Otitis media, unspecified, unspecified ear: Secondary | ICD-10-CM

## 2013-05-19 DIAGNOSIS — J209 Acute bronchitis, unspecified: Secondary | ICD-10-CM

## 2013-05-19 DIAGNOSIS — H6691 Otitis media, unspecified, right ear: Secondary | ICD-10-CM

## 2013-05-19 MED ORDER — LEVOFLOXACIN 500 MG PO TABS: 500.0000 mg | ORAL_TABLET | Freq: Every day | ORAL | Status: DC

## 2013-05-19 MED ORDER — HYDROCODONE-HOMATROPINE 5-1.5 MG/5ML PO SYRP: 5.0000 mL | ORAL_SOLUTION | Freq: Three times a day (TID) | ORAL | Status: DC | PRN

## 2013-05-19 NOTE — Patient Instructions (Signed)
Take Levaquin daily x 10 days. Take Hycodan as directed for cough

## 2013-05-19 NOTE — Progress Notes (Signed)
  Subjective:    Patient ID: Gabriella [name], female    DOB: Dec 09, 1955, 57 y.o.   MRN: [id number]  HPI 4 week history of cough and congestion. Patient smoking about 10 cigarettes daily. No fever or shaking chills. No shortness of breath. Has right ear pain. History of type 2 diabetes mellitus and hypertension. Caring for mother with has Alzheimer's disease at home.    Review of Systems     Objective:   Physical Exam Skin warm and dry. Nodes none. HEENT exam: Right TM is full but not red. Left TM chronically scarred. Pharynx is clear. Neck is supple. Chest: bronchial breath sounds that clear with coughing. No rales or wheezing.        Assessment & Plan:  Acute right otitis media  Bronchitis  Plan: Levaquin 500 milligrams daily for 10 days. Hycodan 8 ounces 1 teaspoon by mouth every 8 hours when necessary cough.

## 2013-05-25 ENCOUNTER — Ambulatory Visit (INDEPENDENT_AMBULATORY_CARE_PROVIDER_SITE_OTHER): Payer: BC Managed Care – PPO | Admitting: Internal Medicine

## 2013-05-25 ENCOUNTER — Encounter: Payer: Self-pay | Admitting: Internal Medicine

## 2013-05-25 VITALS — BP 160/80 | HR 72 | Temp 97.6°F | Wt 256.0 lb

## 2013-05-25 DIAGNOSIS — J029 Acute pharyngitis, unspecified: Secondary | ICD-10-CM

## 2013-05-25 LAB — CBC WITH DIFFERENTIAL/PLATELET
Basophils Absolute: 0 10*3/uL (ref 0.0–0.1)
Basophils Relative: 0 % (ref 0–1)
Eosinophils Absolute: 0 10*3/uL (ref 0.0–0.7)
Eosinophils Relative: 0 % (ref 0–5)
HCT: 43.4 % (ref 36.0–46.0)
MCH: 32.8 pg (ref 26.0–34.0)
MCHC: 35 g/dL (ref 30.0–36.0)
MCV: 93.7 fL (ref 78.0–100.0)
Monocytes Absolute: 0.6 10*3/uL (ref 0.1–1.0)
RDW: 14.2 % (ref 11.5–15.5)

## 2013-05-25 MED ORDER — LIDOCAINE HCL 1 % IJ SOLN: 1000.0000 mg | Freq: Once | INTRAMUSCULAR | Status: DC

## 2013-05-25 NOTE — Patient Instructions (Signed)
You have been given 1 g IM Rocephin. Take doxycycline 100 mg twice daily for 10 days. CBC with differential has been drawn.

## 2013-05-25 NOTE — Progress Notes (Signed)
  Subjective:    Patient ID: Gabriella [name], female    DOB: 11/30/1955, 57 y.o.   MRN: [id number]  HPI Patient was here October 21 complaining of right otitis media and bronchitis. Was treated with Levaquin 500 milligrams daily for 7 days. Says she is not completely well. Right ear is still painful and she's developed some pain in her right anterior cervical neck area. No fever or shaking chills.    Review of Systems     Objective:   Physical Exam HEENT exam: TMs are full bilaterally but not read it. Pharynx is clear. Neck is supple. No significant adenopathy but she is tender in the right anterior cervical neck area. Chest is clear to auscultation. CBC with differential drawn and is pending.        Assessment & Plan:  Protracted bronchitis    Right otitis media   Right cervical adenitis  Plan: Rocephin 1 g IM. Doxycycline 100 mg twice daily for 10 days. Has hydrocodone for pain should she need it. She also has Hycodan cough syrup which was prescribed October 21.

## 2013-06-02 ENCOUNTER — Other Ambulatory Visit: Payer: Self-pay | Admitting: Internal Medicine

## 2013-07-05 ENCOUNTER — Other Ambulatory Visit: Payer: Self-pay | Admitting: Internal Medicine

## 2013-08-11 ENCOUNTER — Other Ambulatory Visit: Payer: Self-pay | Admitting: Internal Medicine

## 2013-08-12 ENCOUNTER — Other Ambulatory Visit: Payer: Self-pay | Admitting: Internal Medicine

## 2013-09-01 ENCOUNTER — Other Ambulatory Visit: Payer: Self-pay | Admitting: Internal Medicine

## 2013-10-12 ENCOUNTER — Telehealth: Payer: Self-pay | Admitting: Internal Medicine

## 2013-10-13 ENCOUNTER — Other Ambulatory Visit: Payer: Self-pay

## 2013-10-13 MED ORDER — HYDROCODONE-ACETAMINOPHEN 5-325 MG PO TABS: 1.0000 | ORAL_TABLET | Freq: Four times a day (QID) | ORAL | Status: DC | PRN

## 2013-10-13 NOTE — Telephone Encounter (Signed)
Rx printed and signed and picked up by party named above.

## 2013-11-05 ENCOUNTER — Other Ambulatory Visit: Payer: Self-pay | Admitting: Internal Medicine

## 2013-12-01 ENCOUNTER — Other Ambulatory Visit: Payer: Self-pay | Admitting: Internal Medicine

## 2013-12-04 ENCOUNTER — Other Ambulatory Visit: Payer: Self-pay | Admitting: Internal Medicine

## 2013-12-13 ENCOUNTER — Other Ambulatory Visit: Payer: Self-pay | Admitting: Internal Medicine

## 2014-02-05 ENCOUNTER — Other Ambulatory Visit: Payer: Self-pay | Admitting: Internal Medicine

## 2014-02-22 ENCOUNTER — Other Ambulatory Visit: Payer: BC Managed Care – PPO | Admitting: Internal Medicine

## 2014-02-22 DIAGNOSIS — E785 Hyperlipidemia, unspecified: Secondary | ICD-10-CM

## 2014-02-22 DIAGNOSIS — Z13228 Encounter for screening for other metabolic disorders: Secondary | ICD-10-CM

## 2014-02-22 DIAGNOSIS — I10 Essential (primary) hypertension: Secondary | ICD-10-CM

## 2014-02-22 DIAGNOSIS — Z130 Encounter for screening for diseases of the blood and blood-forming organs and certain disorders involving the immune mechanism: Secondary | ICD-10-CM

## 2014-02-22 DIAGNOSIS — Z1329 Encounter for screening for other suspected endocrine disorder: Secondary | ICD-10-CM

## 2014-02-22 DIAGNOSIS — R7301 Impaired fasting glucose: Secondary | ICD-10-CM

## 2014-02-22 LAB — CBC WITH DIFFERENTIAL/PLATELET
BASOS PCT: 0 % (ref 0–1)
Basophils Absolute: 0 10*3/uL (ref 0.0–0.1)
Eosinophils Absolute: 0.1 10*3/uL (ref 0.0–0.7)
Eosinophils Relative: 1 % (ref 0–5)
HEMATOCRIT: 46.8 % — AB (ref 36.0–46.0)
HEMOGLOBIN: 16.1 g/dL — AB (ref 12.0–15.0)
LYMPHS ABS: 2.4 10*3/uL (ref 0.7–4.0)
LYMPHS PCT: 42 % (ref 12–46)
MCH: 32.9 pg (ref 26.0–34.0)
MCHC: 34.4 g/dL (ref 30.0–36.0)
MCV: 95.7 fL (ref 78.0–100.0)
MONO ABS: 0.6 10*3/uL (ref 0.1–1.0)
Monocytes Relative: 10 % (ref 3–12)
NEUTROS PCT: 47 % (ref 43–77)
Neutro Abs: 2.7 10*3/uL (ref 1.7–7.7)
Platelets: 224 10*3/uL (ref 150–400)
RBC: 4.89 MIL/uL (ref 3.87–5.11)
RDW: 13.5 % (ref 11.5–15.5)
WBC: 5.7 10*3/uL (ref 4.0–10.5)

## 2014-02-22 LAB — COMPREHENSIVE METABOLIC PANEL
ALBUMIN: 3.9 g/dL (ref 3.5–5.2)
ALK PHOS: 77 U/L (ref 39–117)
ALT: 39 U/L — ABNORMAL HIGH (ref 0–35)
AST: 25 U/L (ref 0–37)
BUN: 23 mg/dL (ref 6–23)
CALCIUM: 8.8 mg/dL (ref 8.4–10.5)
CHLORIDE: 106 meq/L (ref 96–112)
CO2: 24 meq/L (ref 19–32)
Creat: 0.64 mg/dL (ref 0.50–1.10)
GLUCOSE: 116 mg/dL — AB (ref 70–99)
POTASSIUM: 3.9 meq/L (ref 3.5–5.3)
SODIUM: 141 meq/L (ref 135–145)
TOTAL PROTEIN: 6.6 g/dL (ref 6.0–8.3)
Total Bilirubin: 0.5 mg/dL (ref 0.2–1.2)

## 2014-02-22 LAB — HEMOGLOBIN A1C
HEMOGLOBIN A1C: 5.9 % — AB (ref <5.7)
MEAN PLASMA GLUCOSE: 123 mg/dL — AB (ref <117)

## 2014-02-22 LAB — LIPID PANEL
Cholesterol: 143 mg/dL (ref 0–200)
HDL: 38 mg/dL — ABNORMAL LOW (ref >39)
LDL Cholesterol: 74 mg/dL (ref 0–99)
TRIGLYCERIDES: 153 mg/dL — AB (ref <150)
Total CHOL/HDL Ratio: 3.8 Ratio
VLDL: 31 mg/dL (ref 0–40)

## 2014-02-22 LAB — TSH: TSH: 1.239 u[IU]/mL (ref 0.350–4.500)

## 2014-02-23 ENCOUNTER — Ambulatory Visit (INDEPENDENT_AMBULATORY_CARE_PROVIDER_SITE_OTHER): Payer: BC Managed Care – PPO | Admitting: Internal Medicine

## 2014-02-23 ENCOUNTER — Encounter: Payer: Self-pay | Admitting: Internal Medicine

## 2014-02-23 VITALS — BP 126/74 | HR 68 | Temp 98.7°F | Ht 64.75 in | Wt 260.0 lb

## 2014-02-23 DIAGNOSIS — E669 Obesity, unspecified: Secondary | ICD-10-CM

## 2014-02-23 DIAGNOSIS — M25579 Pain in unspecified ankle and joints of unspecified foot: Secondary | ICD-10-CM

## 2014-02-23 DIAGNOSIS — E785 Hyperlipidemia, unspecified: Secondary | ICD-10-CM

## 2014-02-23 DIAGNOSIS — R609 Edema, unspecified: Secondary | ICD-10-CM

## 2014-02-23 DIAGNOSIS — E119 Type 2 diabetes mellitus without complications: Secondary | ICD-10-CM

## 2014-02-23 DIAGNOSIS — M7918 Myalgia, other site: Secondary | ICD-10-CM

## 2014-02-23 DIAGNOSIS — I10 Essential (primary) hypertension: Secondary | ICD-10-CM

## 2014-02-23 DIAGNOSIS — M25572 Pain in left ankle and joints of left foot: Secondary | ICD-10-CM

## 2014-02-23 DIAGNOSIS — Z0000 Encounter for general adult medical examination without abnormal findings: Secondary | ICD-10-CM

## 2014-02-23 DIAGNOSIS — IMO0001 Reserved for inherently not codable concepts without codable children: Secondary | ICD-10-CM

## 2014-02-23 LAB — POCT URINALYSIS DIPSTICK
Bilirubin, UA: NEGATIVE
GLUCOSE UA: NEGATIVE
Ketones, UA: NEGATIVE
Leukocytes, UA: NEGATIVE
Nitrite, UA: NEGATIVE
RBC UA: NEGATIVE
SPEC GRAV UA: 1.015
UROBILINOGEN UA: NEGATIVE
pH, UA: 6

## 2014-02-23 LAB — VITAMIN D 25 HYDROXY (VIT D DEFICIENCY, FRACTURES): Vit D, 25-Hydroxy: 57 ng/mL (ref 30–89)

## 2014-02-23 MED ORDER — PREDNISONE (PAK) 10 MG PO TABS: ORAL_TABLET | ORAL | Status: DC

## 2014-02-23 MED ORDER — HYDROCODONE-ACETAMINOPHEN 5-325 MG PO TABS: 1.0000 | ORAL_TABLET | Freq: Four times a day (QID) | ORAL | Status: DC | PRN

## 2014-02-23 NOTE — Progress Notes (Signed)
   Subjective:    Patient ID: Gabriella [name], female    DOB: 06-06-1956, 58 y.o.   MRN: [id number]  HPI 58 year old White female in today for health maintenance exam and evaluation of medical issues. History of hypertension, hyperlipidemia, obesity, metabolic syndrome, type 2 diabetes mellitus, peptic ulcer disease.  No known drug allergies.  Never had colonoscopy. This was discussed today. 3 Hemoccult cards given.  History of smoking. Has smoked for over 30 years. Does not want to quit. No alcohol consumption.  Hospitalized for perforated duodenal ulcer requiring surgery April 2008. History of debridement of the anterior cruciate ligament right knee November 2004 at [facility].  Social history: This is her second marriage. She is a native of [place]. She is married to [employer] who owns a trucking business and a farm. 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 They are raising her daughter's son who has attention deficit disorder. Patient only had one daughter. Daughter has had issues with substance abuse. Doesn't have contact with her son.  Family history: Mother just died of complications of dementia with history of hypertension. Father with history of lung cancer. Total of 5 brothers. Younger 2 brothers have history of heart murmurs.    Review of Systems  Musculoskeletal:       Continues to have issues with left ankle pain which prevents her from exercising       Objective:   Physical Exam  Vitals reviewed. Constitutional: She is oriented to person, place, and time. She appears well-developed and well-nourished. No distress.  HENT:  Head: Normocephalic and atraumatic.  Right Ear: External ear normal.  Left Ear: External ear normal.  Mouth/Throat: Oropharynx is clear and moist. No oropharyngeal exudate.  Eyes: Conjunctivae and EOM are normal. Pupils are equal, round, and reactive to light. Right eye exhibits no discharge. Left eye exhibits no discharge. No scleral  icterus.  Neck: Neck supple. No JVD present. No thyromegaly present.  Cardiovascular: Normal rate, normal heart sounds and intact distal pulses.   No murmur heard. Pulmonary/Chest: Effort normal. No respiratory distress. She has wheezes. She has no rales. She exhibits no tenderness.  Breast normal female. Rhonchi present on exam  Abdominal: Soft. Bowel sounds are normal. She exhibits no distension and no mass. There is no tenderness. There is no rebound and no guarding.  Genitourinary:  Pap done 2013. Bimanual normal  Musculoskeletal: Normal range of motion. She exhibits no edema.  Neurological: She is alert and oriented to person, place, and time. She has normal reflexes. Coordination normal.  Skin: Skin is warm and dry. No rash noted. She is not diaphoretic.  Psychiatric: She has a normal mood and affect. Her behavior is normal. Judgment and thought content normal.          Assessment & Plan:  Hypertension  Hyperlipidemia-stable on lipid-lowering medication-stable  Metabolic syndrome  Obesity  Type 2 diabetes mellitus-well controlled with diet alone. Hemoglobin A1c 5.9%  Left ankle pain which is chronic. Needs to see orthopedist.  COPD exacerbation  Plan: If patient will not have colonoscopy needs to complete 3 Hemoccult cards. Had Pneumovax immunization done in May 2011. Recommend annual mammogram. Order placed. Recommend annual flu vaccine. Patient does not want to quit smoking at the present time. Return in 6 months for office visit, lipid panel, liver functions hemoglobin A1c and blood pressure check. Today gave her 12 day prednisone 10 mg dosepak for COPD exacerbation.

## 2014-02-23 NOTE — Patient Instructions (Addendum)
Take Prednisone for 12 days. Return in 6 months. Get mammogram.

## 2014-02-24 LAB — MICROALBUMIN, URINE: Microalb, Ur: 37.25 mg/dL — ABNORMAL HIGH (ref 0.00–1.89)

## 2014-03-01 ENCOUNTER — Ambulatory Visit
Admission: RE | Admit: 2014-03-01 | Discharge: 2014-03-01 | Disposition: A | Discharge status: Home / Self Care | Payer: BC Managed Care – PPO | Source: Ambulatory Visit | Attending: Internal Medicine | Admitting: Internal Medicine

## 2014-03-01 ENCOUNTER — Ambulatory Visit: Admission: RE | Admit: 2014-03-01 | Payer: BC Managed Care – PPO | Source: Ambulatory Visit

## 2014-03-01 ENCOUNTER — Other Ambulatory Visit: Payer: Self-pay | Admitting: Internal Medicine

## 2014-03-01 DIAGNOSIS — Z1231 Encounter for screening mammogram for malignant neoplasm of breast: Secondary | ICD-10-CM

## 2014-03-01 DIAGNOSIS — Z0000 Encounter for general adult medical examination without abnormal findings: Secondary | ICD-10-CM

## 2014-03-26 ENCOUNTER — Other Ambulatory Visit: Payer: Self-pay | Admitting: Internal Medicine

## 2014-08-02 ENCOUNTER — Other Ambulatory Visit: Payer: Self-pay | Admitting: Internal Medicine

## 2014-08-08 ENCOUNTER — Other Ambulatory Visit: Payer: Self-pay | Admitting: Internal Medicine

## 2014-08-23 ENCOUNTER — Other Ambulatory Visit: Payer: BLUE CROSS/BLUE SHIELD | Admitting: Internal Medicine

## 2014-08-23 DIAGNOSIS — Z0000 Encounter for general adult medical examination without abnormal findings: Secondary | ICD-10-CM

## 2014-08-23 DIAGNOSIS — Z131 Encounter for screening for diabetes mellitus: Secondary | ICD-10-CM

## 2014-08-23 DIAGNOSIS — Z13220 Encounter for screening for lipoid disorders: Secondary | ICD-10-CM

## 2014-08-23 LAB — HEMOGLOBIN A1C
Hgb A1c MFr Bld: 6 % — ABNORMAL HIGH (ref <5.7)
MEAN PLASMA GLUCOSE: 126 mg/dL — AB (ref <117)

## 2014-08-23 LAB — LIPID PANEL
Cholesterol: 161 mg/dL (ref 0–200)
HDL: 39 mg/dL — ABNORMAL LOW (ref >39)
LDL Cholesterol: 85 mg/dL (ref 0–99)
TRIGLYCERIDES: 185 mg/dL — AB (ref <150)
Total CHOL/HDL Ratio: 4.1 Ratio
VLDL: 37 mg/dL (ref 0–40)

## 2014-08-23 LAB — HEPATIC FUNCTION PANEL
ALK PHOS: 78 U/L (ref 39–117)
ALT: 36 U/L — ABNORMAL HIGH (ref 0–35)
AST: 29 U/L (ref 0–37)
Albumin: 4.1 g/dL (ref 3.5–5.2)
BILIRUBIN DIRECT: 0.1 mg/dL (ref 0.0–0.3)
BILIRUBIN TOTAL: 0.5 mg/dL (ref 0.2–1.2)
Indirect Bilirubin: 0.4 mg/dL (ref 0.2–1.2)
TOTAL PROTEIN: 6.6 g/dL (ref 6.0–8.3)

## 2014-08-24 ENCOUNTER — Encounter: Payer: Self-pay | Admitting: Internal Medicine

## 2014-08-24 ENCOUNTER — Ambulatory Visit (INDEPENDENT_AMBULATORY_CARE_PROVIDER_SITE_OTHER): Payer: BLUE CROSS/BLUE SHIELD | Admitting: Internal Medicine

## 2014-08-24 VITALS — BP 132/80 | HR 68 | Temp 98.2°F | Wt 264.0 lb

## 2014-08-24 DIAGNOSIS — M19011 Primary osteoarthritis, right shoulder: Secondary | ICD-10-CM

## 2014-08-24 DIAGNOSIS — M12819 Other specific arthropathies, not elsewhere classified, unspecified shoulder: Secondary | ICD-10-CM

## 2014-08-24 MED ORDER — HYDROCODONE-ACETAMINOPHEN 10-325 MG PO TABS: 1.0000 | ORAL_TABLET | Freq: Three times a day (TID) | ORAL | Status: DC | PRN

## 2014-08-24 NOTE — Patient Instructions (Addendum)
Have x-ray of left ankle and right shoulder. To see Dr. Onnie Graham regarding right shoulder and orthopedic foot specialist regarding left ankle. Increase hydrocodone/APAP from 05/325 to 10/325 to take every 12 hours. Hemoglobin A1c stable. Continue diet and exercise efforts. Return in 6 months. Do not take ibuprofen because of history of gastric ulcer.

## 2014-08-24 NOTE — Progress Notes (Signed)
   Subjective:    Patient ID: Gabriella [name], female    DOB: [birth year], 59 y.o.   MRN: [id number]  HPI  For recheck on multiple issues including right shoulder pain, and left ankle pain in addition to obesity, hypertension, metabolic syndrome, hyperlipidemia, diabetes mellitus.  Had eye exam 09/09/13 and will be due again  for annual diabetic eye exam. No issues with eyes except  may be some slight change in one she says. She's not receiving relief with hydrocodone/APAP 5/325. Has been taking 800 mg ibuprofen daily. This is concerning because she has a history of surgery for perforated gastric ulcer in the remote past.  She formerly worked in a [employer] for number of years and used her right upper extremity a great deal. Sounds like she may have some right shoulder arthropathy. Pain with raising arm up over her head. Also pain and left medial ankle. Tender over medial malleolus. Has never had x-rays of shoulder or foot. We will proceed with these. Also proceed with orthopedic referral for both.  With regard to hyperlipidemia, triglycerides are elevated at 185 and previously were 153. With regard to hemoglobin A1c result is 6.0 and previously was 5.9%. She probably has not watched her diet during the holidays quite as much as she should. She has gained 4 pounds since July.    Review of Systems  noncontributory except as above regarding shoulder and foot pain     Objective:   Physical Exam  Neck supple without JVD thyromegaly carotid bruits. Chest clear to auscultation. Cardiac exam regular rate and rhythm. Extremities without edema. Pain with raising right upper extremity up over her head. Pain noted with palpation of left medial malleolus.      Assessment & Plan:   Right shoulder arthropathy  Left ankle pain-? Arthropathy left medial malleolus. Do not take ibuprofen because of history of perforated gastric ulcer. Increase hydrocodone/APAP from 5-10 mg of hydrocodone. See written  prescription.  Type 2 diabetes mellitus-stable. Reminded diabetic eye exam  Obesity-encouraged diet and exercise.  Hyperlipidemia-triglycerides elevated from previous reading several months ago. Watch diet.  Hypertension-stable on current regimen  Metabolic syndrome  Plan: Continue to encourage diet exercise and weight loss. Continue same medications and return in 6 months for physical exam. Appointment with orthopedist regarding shoulder and left ankle. Plain x-rays of right shoulder and left ankle.

## 2014-08-25 ENCOUNTER — Ambulatory Visit
Admission: RE | Admit: 2014-08-25 | Discharge: 2014-08-25 | Disposition: A | Discharge status: Home / Self Care | Payer: BLUE CROSS/BLUE SHIELD | Source: Ambulatory Visit | Attending: Internal Medicine | Admitting: Internal Medicine

## 2014-08-25 ENCOUNTER — Telehealth: Payer: Self-pay | Admitting: *Deleted

## 2014-08-25 ENCOUNTER — Encounter: Payer: Self-pay | Admitting: Internal Medicine

## 2014-08-25 DIAGNOSIS — M19011 Primary osteoarthritis, right shoulder: Secondary | ICD-10-CM

## 2014-08-25 DIAGNOSIS — M25511 Pain in right shoulder: Secondary | ICD-10-CM

## 2014-08-25 DIAGNOSIS — M25572 Pain in left ankle and joints of left foot: Secondary | ICD-10-CM

## 2014-08-25 NOTE — Telephone Encounter (Signed)
Patient scheduled to see Dr Onnie Graham regarding right shoulder pain on Feb 15,2016 at 9:15 pt to arrive at 8:45. She is also scheduled to see Dr Doran Durand for left ankle pain on October 04, 2014 at 3:30 pt to arrive at 3:00 information will be faxed after xray results received.

## 2014-08-27 NOTE — Telephone Encounter (Signed)
Patent notified regarding appt times for ortho referrals

## 2014-09-29 ENCOUNTER — Other Ambulatory Visit: Payer: Self-pay | Admitting: Internal Medicine

## 2014-11-05 ENCOUNTER — Other Ambulatory Visit: Payer: Self-pay | Admitting: Internal Medicine

## 2014-11-05 NOTE — Telephone Encounter (Signed)
Left message for patient to call for appt.

## 2014-11-05 NOTE — Telephone Encounter (Signed)
CPE due after July 18. Refill through July and book appt

## 2014-11-05 NOTE — Telephone Encounter (Signed)
See note

## 2014-11-11 ENCOUNTER — Other Ambulatory Visit: Payer: Self-pay | Admitting: Internal Medicine

## 2014-11-11 NOTE — Telephone Encounter (Signed)
Refills on protonix sent to patient pharmacy

## 2014-11-15 ENCOUNTER — Other Ambulatory Visit: Payer: Self-pay | Admitting: *Deleted

## 2014-11-15 MED ORDER — PANTOPRAZOLE SODIUM 40 MG PO TBEC: 40.0000 mg | DELAYED_RELEASE_TABLET | Freq: Every day | ORAL | Status: DC

## 2014-11-15 NOTE — Telephone Encounter (Signed)
Refills faxed by Dr Renold Genta

## 2014-11-17 ENCOUNTER — Other Ambulatory Visit: Payer: Self-pay | Admitting: Internal Medicine

## 2015-01-16 ENCOUNTER — Other Ambulatory Visit: Payer: Self-pay | Admitting: Internal Medicine

## 2015-01-17 NOTE — Telephone Encounter (Signed)
Check as refill for no more than 6 months

## 2015-02-06 ENCOUNTER — Other Ambulatory Visit: Payer: Self-pay | Admitting: Internal Medicine

## 2015-02-24 ENCOUNTER — Other Ambulatory Visit: Payer: BLUE CROSS/BLUE SHIELD | Admitting: Internal Medicine

## 2015-02-24 DIAGNOSIS — Z13220 Encounter for screening for lipoid disorders: Secondary | ICD-10-CM

## 2015-02-24 DIAGNOSIS — Z130 Encounter for screening for diseases of the blood and blood-forming organs and certain disorders involving the immune mechanism: Secondary | ICD-10-CM

## 2015-02-24 DIAGNOSIS — Z0000 Encounter for general adult medical examination without abnormal findings: Secondary | ICD-10-CM

## 2015-02-24 DIAGNOSIS — Z1321 Encounter for screening for nutritional disorder: Secondary | ICD-10-CM

## 2015-02-24 DIAGNOSIS — R7309 Other abnormal glucose: Secondary | ICD-10-CM

## 2015-02-24 DIAGNOSIS — Z1329 Encounter for screening for other suspected endocrine disorder: Secondary | ICD-10-CM

## 2015-02-24 LAB — CBC WITH DIFFERENTIAL/PLATELET
Basophils Absolute: 0 10*3/uL (ref 0.0–0.1)
Basophils Relative: 0 % (ref 0–1)
Eosinophils Absolute: 0 10*3/uL (ref 0.0–0.7)
Eosinophils Relative: 0 % (ref 0–5)
HCT: 48.2 % — ABNORMAL HIGH (ref 36.0–46.0)
HEMOGLOBIN: 16.5 g/dL — AB (ref 12.0–15.0)
Lymphocytes Relative: 42 % (ref 12–46)
Lymphs Abs: 2.4 10*3/uL (ref 0.7–4.0)
MCH: 33.3 pg (ref 26.0–34.0)
MCHC: 34.2 g/dL (ref 30.0–36.0)
MCV: 97.2 fL (ref 78.0–100.0)
MPV: 9.8 fL (ref 8.6–12.4)
Monocytes Absolute: 0.5 10*3/uL (ref 0.1–1.0)
Monocytes Relative: 9 % (ref 3–12)
NEUTROS ABS: 2.8 10*3/uL (ref 1.7–7.7)
NEUTROS PCT: 49 % (ref 43–77)
PLATELETS: 225 10*3/uL (ref 150–400)
RBC: 4.96 MIL/uL (ref 3.87–5.11)
RDW: 14 % (ref 11.5–15.5)
WBC: 5.7 10*3/uL (ref 4.0–10.5)

## 2015-02-24 LAB — COMPLETE METABOLIC PANEL WITH GFR
ALK PHOS: 87 U/L (ref 33–130)
ALT: 37 U/L — ABNORMAL HIGH (ref 6–29)
AST: 27 U/L (ref 10–35)
Albumin: 4.1 g/dL (ref 3.6–5.1)
BILIRUBIN TOTAL: 0.7 mg/dL (ref 0.2–1.2)
BUN: 14 mg/dL (ref 7–25)
CO2: 24 mEq/L (ref 20–31)
CREATININE: 0.54 mg/dL (ref 0.50–1.05)
Calcium: 9.2 mg/dL (ref 8.6–10.4)
Chloride: 107 mEq/L (ref 98–110)
GFR, Est Non African American: >89 mL/min (ref >=60)
Glucose, Bld: 116 mg/dL — ABNORMAL HIGH (ref 65–99)
Potassium: 4 mEq/L (ref 3.5–5.3)
Sodium: 141 mEq/L (ref 135–146)
Total Protein: 6.8 g/dL (ref 6.1–8.1)

## 2015-02-24 LAB — LIPID PANEL
CHOL/HDL RATIO: 4.9 ratio (ref <=5.0)
CHOLESTEROL: 180 mg/dL (ref 125–200)
HDL: 37 mg/dL — AB (ref >=46)
LDL CALC: 103 mg/dL (ref <130)
Triglycerides: 202 mg/dL — ABNORMAL HIGH (ref <150)
VLDL: 40 mg/dL — ABNORMAL HIGH (ref <30)

## 2015-02-24 LAB — TSH: TSH: 1.531 u[IU]/mL (ref 0.350–4.500)

## 2015-02-24 LAB — HEMOGLOBIN A1C
Hgb A1c MFr Bld: 6.1 % — ABNORMAL HIGH (ref <5.7)
MEAN PLASMA GLUCOSE: 128 mg/dL — AB (ref <117)

## 2015-02-25 LAB — VITAMIN D 25 HYDROXY (VIT D DEFICIENCY, FRACTURES): Vit D, 25-Hydroxy: 34 ng/mL (ref 30–100)

## 2015-02-28 ENCOUNTER — Ambulatory Visit (INDEPENDENT_AMBULATORY_CARE_PROVIDER_SITE_OTHER): Payer: BLUE CROSS/BLUE SHIELD | Admitting: Internal Medicine

## 2015-02-28 ENCOUNTER — Encounter: Payer: Self-pay | Admitting: Internal Medicine

## 2015-02-28 VITALS — BP 128/78 | HR 72 | Temp 98.0°F | Ht 65.0 in | Wt 264.0 lb

## 2015-02-28 DIAGNOSIS — M791 Myalgia: Secondary | ICD-10-CM | POA: Diagnosis not present

## 2015-02-28 DIAGNOSIS — R7309 Other abnormal glucose: Secondary | ICD-10-CM

## 2015-02-28 DIAGNOSIS — Z8711 Personal history of peptic ulcer disease: Secondary | ICD-10-CM

## 2015-02-28 DIAGNOSIS — I10 Essential (primary) hypertension: Secondary | ICD-10-CM | POA: Diagnosis not present

## 2015-02-28 DIAGNOSIS — E8881 Metabolic syndrome: Secondary | ICD-10-CM

## 2015-02-28 DIAGNOSIS — E669 Obesity, unspecified: Secondary | ICD-10-CM

## 2015-02-28 DIAGNOSIS — M7918 Myalgia, other site: Secondary | ICD-10-CM

## 2015-02-28 DIAGNOSIS — Z0000 Encounter for general adult medical examination without abnormal findings: Secondary | ICD-10-CM

## 2015-02-28 DIAGNOSIS — R7303 Prediabetes: Secondary | ICD-10-CM

## 2015-02-28 DIAGNOSIS — Z720 Tobacco use: Secondary | ICD-10-CM

## 2015-02-28 DIAGNOSIS — F17200 Nicotine dependence, unspecified, uncomplicated: Secondary | ICD-10-CM

## 2015-02-28 LAB — POCT URINALYSIS DIPSTICK
BILIRUBIN UA: NEGATIVE
GLUCOSE UA: NEGATIVE
Ketones, UA: NEGATIVE
Leukocytes, UA: NEGATIVE
NITRITE UA: NEGATIVE
Protein, UA: NEGATIVE
RBC UA: NEGATIVE
Urobilinogen, UA: NEGATIVE
pH, UA: 6

## 2015-03-16 NOTE — Progress Notes (Signed)
   Subjective:    Patient ID: Odella Aquas, female    DOB: 06-22-1956, 59 y.o.   MRN: [id number]  HPI 59 year old White Female in today for health maintenance exam and evaluation of medical issues. History of hypertension, hyperlipidemia, obesity, metabolic syndrome, type 2 diabetes mellitus, peptic ulcer disease.  No known drug allergies.  Colonoscopy discussed. Has never had one. Also was discussed last year.  History of smoking. Has smoked for well over 30 years. Does not want to quit. No alcohol consumption.  Hospitalized for perforated duodenal ulcer requiring surgery April 2008. History of debridement of anterior cruciate ligament right knee November 2004 at [facility].  Social history: This is her second marriage. 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 They are raising her daughter's son who has a history of attention deficit disorder. He just graduated from high school. He will be helping on the farm. Her daughter has had issues with substance abuse and doesn't have contact with her son. They have actually adopted him and he goes by [employer].  Family history: Mother died of, occasion's of dementia with history of hypertension. Father with history of lung cancer. Total of 5 brothers. Younger 2 brothers have history of heart murmurs.      Review of Systems  Constitutional: Positive for fatigue.  HENT: Negative.   Eyes: Negative.   Respiratory: Negative.   Cardiovascular: Negative for chest pain, palpitations and leg swelling.  Gastrointestinal: Negative.   Genitourinary: Negative.   Neurological: Negative.   Hematological: Negative.        Objective:   Physical Exam  Constitutional: She is oriented to person, place, and time. She appears well-developed and well-nourished. No distress.  HENT:  Head: Normocephalic and atraumatic.  Right Ear: External ear normal.  Left Ear: External ear  normal.  Mouth/Throat: Oropharynx is clear and moist. No oropharyngeal exudate.  Eyes: Conjunctivae and EOM are normal. Pupils are equal, round, and reactive to light. Right eye exhibits no discharge. Left eye exhibits no discharge. No scleral icterus.  Neck: Neck supple. No JVD present. No thyromegaly present.  Cardiovascular: Normal rate, regular rhythm, normal heart sounds and intact distal pulses.   No murmur heard. Pulmonary/Chest: Effort normal and breath sounds normal. No respiratory distress. She has no wheezes. She has no rales.  Abdominal: Soft. Bowel sounds are normal. She exhibits no distension and no mass. There is no tenderness. There is no rebound and no guarding.  Genitourinary:  Pap done 2013  Musculoskeletal: Normal range of motion. She exhibits no edema.  Lymphadenopathy:    She has no cervical adenopathy.  Neurological: She is alert and oriented to person, place, and time. No cranial nerve deficit. Coordination normal.  Skin: Skin is warm and dry. No rash noted. She is not diaphoretic.  Psychiatric: She has a normal mood and affect. Her behavior is normal. Judgment and thought content normal.  Vitals reviewed.         Assessment & Plan:  Essential hypertension-stable medication  Hyperlipidemia-stable on lipid-lowering medication  Metabolic syndrome   Prediabetes- hemoglobin A1c 6.1%  Obesity  History of peptic ulcer disease  Smoking  Plan: Continue same medications and return in 6 months. Needs colonoscopy. Needs bone density study. Recommend annual mammogram.

## 2015-03-16 NOTE — Patient Instructions (Signed)
Encouraged diet exercise and weight loss. Needs colonoscopy and annual mammogram. Return in 6 months

## 2015-05-08 ENCOUNTER — Other Ambulatory Visit: Payer: Self-pay | Admitting: Internal Medicine

## 2015-05-16 ENCOUNTER — Telehealth: Payer: Self-pay

## 2015-05-16 NOTE — Telephone Encounter (Signed)
Patient does not have insurance, she will wait until it is reinstated.

## 2015-05-17 ENCOUNTER — Other Ambulatory Visit: Payer: Self-pay | Admitting: Internal Medicine

## 2015-07-17 ENCOUNTER — Other Ambulatory Visit: Payer: Self-pay | Admitting: Internal Medicine

## 2015-07-18 ENCOUNTER — Other Ambulatory Visit: Payer: Self-pay

## 2015-07-18 DIAGNOSIS — Z1231 Encounter for screening mammogram for malignant neoplasm of breast: Secondary | ICD-10-CM

## 2015-07-20 ENCOUNTER — Ambulatory Visit (INDEPENDENT_AMBULATORY_CARE_PROVIDER_SITE_OTHER): Payer: BLUE CROSS/BLUE SHIELD | Admitting: Internal Medicine

## 2015-07-20 ENCOUNTER — Ambulatory Visit
Admission: RE | Admit: 2015-07-20 | Discharge: 2015-07-20 | Disposition: A | Discharge status: Home / Self Care | Payer: BLUE CROSS/BLUE SHIELD | Source: Ambulatory Visit

## 2015-07-20 DIAGNOSIS — Z23 Encounter for immunization: Secondary | ICD-10-CM

## 2015-07-20 DIAGNOSIS — Z1231 Encounter for screening mammogram for malignant neoplasm of breast: Secondary | ICD-10-CM

## 2015-07-20 MED ORDER — HYDROCODONE-ACETAMINOPHEN 10-325 MG PO TABS: 1.0000 | ORAL_TABLET | Freq: Three times a day (TID) | ORAL | Status: DC | PRN

## 2015-09-05 ENCOUNTER — Telehealth: Payer: Self-pay

## 2015-09-05 NOTE — Telephone Encounter (Signed)
Pt called in to inquiry what insurance we had on file. Pt is not sure but will find out which insurance should have been filed for the 06/2015 clinic support visit where she received her vaccine.

## 2015-09-06 ENCOUNTER — Other Ambulatory Visit: Payer: BLUE CROSS/BLUE SHIELD | Admitting: Internal Medicine

## 2015-09-06 DIAGNOSIS — R739 Hyperglycemia, unspecified: Secondary | ICD-10-CM

## 2015-09-06 DIAGNOSIS — E785 Hyperlipidemia, unspecified: Secondary | ICD-10-CM

## 2015-09-06 DIAGNOSIS — Z79899 Other long term (current) drug therapy: Secondary | ICD-10-CM

## 2015-09-06 DIAGNOSIS — I10 Essential (primary) hypertension: Secondary | ICD-10-CM

## 2015-09-06 LAB — HEPATIC FUNCTION PANEL
ALT: 41 U/L — AB (ref 6–29)
AST: 29 U/L (ref 10–35)
Albumin: 3.9 g/dL (ref 3.6–5.1)
Alkaline Phosphatase: 80 U/L (ref 33–130)
Bilirubin, Direct: 0.1 mg/dL (ref <=0.2)
Indirect Bilirubin: 0.4 mg/dL (ref 0.2–1.2)
Total Bilirubin: 0.5 mg/dL (ref 0.2–1.2)
Total Protein: 6.5 g/dL (ref 6.1–8.1)

## 2015-09-06 LAB — LIPID PANEL
CHOL/HDL RATIO: 4.2 ratio (ref <=5.0)
Cholesterol: 156 mg/dL (ref 125–200)
HDL: 37 mg/dL — ABNORMAL LOW (ref >=46)
LDL CALC: 91 mg/dL (ref <130)
TRIGLYCERIDES: 141 mg/dL (ref <150)
VLDL: 28 mg/dL (ref <30)

## 2015-09-07 LAB — HEMOGLOBIN A1C
Hgb A1c MFr Bld: 6.1 % — ABNORMAL HIGH (ref <5.7)
Mean Plasma Glucose: 128 mg/dL — ABNORMAL HIGH (ref <117)

## 2015-09-08 ENCOUNTER — Ambulatory Visit (INDEPENDENT_AMBULATORY_CARE_PROVIDER_SITE_OTHER): Payer: BLUE CROSS/BLUE SHIELD | Admitting: Internal Medicine

## 2015-09-08 ENCOUNTER — Encounter: Payer: Self-pay | Admitting: Internal Medicine

## 2015-09-08 VITALS — BP 144/68 | HR 77 | Temp 97.6°F | Resp 20 | Ht 65.0 in | Wt 260.0 lb

## 2015-09-08 DIAGNOSIS — E119 Type 2 diabetes mellitus without complications: Secondary | ICD-10-CM

## 2015-09-08 DIAGNOSIS — E785 Hyperlipidemia, unspecified: Secondary | ICD-10-CM | POA: Diagnosis not present

## 2015-09-08 DIAGNOSIS — M545 Low back pain: Secondary | ICD-10-CM

## 2015-09-08 DIAGNOSIS — G8929 Other chronic pain: Secondary | ICD-10-CM | POA: Diagnosis not present

## 2015-09-08 DIAGNOSIS — Z87891 Personal history of nicotine dependence: Secondary | ICD-10-CM

## 2015-09-08 DIAGNOSIS — E8881 Metabolic syndrome: Secondary | ICD-10-CM | POA: Diagnosis not present

## 2015-09-08 DIAGNOSIS — I10 Essential (primary) hypertension: Secondary | ICD-10-CM | POA: Diagnosis not present

## 2015-09-08 DIAGNOSIS — E669 Obesity, unspecified: Secondary | ICD-10-CM | POA: Diagnosis not present

## 2015-09-08 DIAGNOSIS — Z720 Tobacco use: Secondary | ICD-10-CM

## 2015-09-08 DIAGNOSIS — E786 Lipoprotein deficiency: Secondary | ICD-10-CM

## 2015-09-08 MED ORDER — LOSARTAN POTASSIUM 100 MG PO TABS: 100.0000 mg | ORAL_TABLET | Freq: Every day | ORAL | Status: DC

## 2015-09-08 NOTE — Patient Instructions (Addendum)
Change Lisinopril to Losartan 100 mg daily. RTC in 3 weeks. Continue other medications.

## 2015-09-12 ENCOUNTER — Other Ambulatory Visit: Payer: Self-pay | Admitting: Internal Medicine

## 2015-09-29 ENCOUNTER — Encounter: Payer: Self-pay | Admitting: Internal Medicine

## 2015-09-29 ENCOUNTER — Telehealth: Payer: Self-pay

## 2015-09-29 ENCOUNTER — Ambulatory Visit (INDEPENDENT_AMBULATORY_CARE_PROVIDER_SITE_OTHER): Payer: BLUE CROSS/BLUE SHIELD | Admitting: Internal Medicine

## 2015-09-29 VITALS — BP 182/100 | HR 68 | Temp 97.9°F | Resp 20 | Ht 65.0 in | Wt 264.0 lb

## 2015-09-29 DIAGNOSIS — M5410 Radiculopathy, site unspecified: Secondary | ICD-10-CM

## 2015-09-29 DIAGNOSIS — M5442 Lumbago with sciatica, left side: Secondary | ICD-10-CM

## 2015-09-29 DIAGNOSIS — G8929 Other chronic pain: Secondary | ICD-10-CM | POA: Diagnosis not present

## 2015-09-29 DIAGNOSIS — M4800 Spinal stenosis, site unspecified: Secondary | ICD-10-CM

## 2015-09-29 DIAGNOSIS — I10 Essential (primary) hypertension: Secondary | ICD-10-CM

## 2015-09-29 DIAGNOSIS — E669 Obesity, unspecified: Secondary | ICD-10-CM

## 2015-09-29 DIAGNOSIS — M549 Dorsalgia, unspecified: Secondary | ICD-10-CM

## 2015-09-29 NOTE — Telephone Encounter (Signed)
MRI w/o contrast of L/S spine- due to mid back pain that radiates into left leg- radiculopathy. Had a while but is getting worse. Taking hydrocodone with no relief

## 2015-10-05 ENCOUNTER — Other Ambulatory Visit: Payer: Self-pay | Admitting: Internal Medicine

## 2015-10-05 DIAGNOSIS — G8929 Other chronic pain: Secondary | ICD-10-CM | POA: Insufficient documentation

## 2015-10-05 DIAGNOSIS — M549 Dorsalgia, unspecified: Secondary | ICD-10-CM

## 2015-10-05 NOTE — Patient Instructions (Addendum)
Continue hydrocodone/APAP. To get MRI of LS-spine as soon as possible. We'll go back on ACE inhibitor instead of ARB and monitor blood pressure closely. Blood pressure could be elevated due to pain.

## 2015-10-05 NOTE — Telephone Encounter (Signed)
I obtained prior authorization or the MRI test code [id number]- outcome is denied. Provider can do a peer to peer review by calling [phone] and will need patients policy ID number which is [id number].

## 2015-10-05 NOTE — Telephone Encounter (Signed)
Peer-to-peer review with nurse approval number [id number] for MRI of LS-spine

## 2015-10-05 NOTE — Progress Notes (Addendum)
   Subjective:    Patient ID: Gabriella [name], female    DOB: September 29, 1955, 60 y.o.   MRN: [id number]  HPI At last visit, we changed her from ACE to ARB  for blood pressure control. However her blood pressure is now worse at 182/100. Patient says that she is in a lot of pain with her back. Says that she takes hydrocodone/APAP sparingly for pain control. Doesn't help all that much. Says she's had back pain for years. Pain is radiating down both legs but left leg more than right leg. Pain is present every single day. Describes pain as excruciating.  Old chart was researched for information regarding orthopedic and spine issues. Patient had right knee anterior cruciate instability and underwent right knee arthroscopic debridement of anterior cruciate ligament and chondroplasty trochlea by Dr. Ermalinda Barrios at [facility] in [facility] November 2004  1984 she was admitted for protracted headache. CT scan was negative. C-spine films showed loss of normal cervical lordosis. CT scan of C-spine showed mild narrowing of the spinal canal evident C6-C7 with only 9 mm of canal at that point. No foraminal encroachment. EEG was normal. Patient was treated with amitriptyline and Tolectin. She underwent a lumbar puncture. She wore cervical collar. She did have TMJ pain. Was diagnosed with cervical stenosis with cervical strain and a migraine headache along with TMJ syndrome.  The patient has smoked for over 30 years. No alcohol consumption.  Family history: Father had a history of lung cancer. Mother with history of hypertension and dementia. 5 brothers. No sisters. She has a grandchild who lives on the farm with her who has finished high school and has attention deficit disorder. Daughter had history of drug abuse and lives elsewhere. Patient first presented to this practice in November 2001. At that time she weighed 254 pounds. Weight increased to 273 pounds in 2005. By 2011 she weighed 265  pounds.  Patient gives a history of both left and right anterior cruciate ligament repair of knees the left was done in 1992 and the right done in 1995. she says but I do not have those records. Old chart indicates she had right anterior cruciate ligament debridement done in 2004.  Had right ovarian cyst and tube removed 1982. Bilateral tubal ligation 1980.  No known drug allergies. At the time she presented here in 2001 she was on Avapro and Ziac for hypertension.  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  History of acute gout 2010.  Had upper GI endoscopy August 2008 after admission for perforated duodenal ulcer April 2008.  Medical issues include history of impaired glucose tolerance, hypertriglyceridemia, hypokalemia secondary to diuretic therapy, vitamin D deficiency, obesity, dependent edema, stress urinary incontinence.         Review of Systems as above     Objective:   Physical Exam Straight leg raising is positive on the left, plus minus on the right. Muscle strength in the lower extremities is 4/5. Deep tendon reflexes 1+ and symmetrical in the knees absent in the ankles       Assessment & Plan:  Probable lumbar spinal stenosis  Poorly controlled hypertension-likely due to pain  Essential hypertension  Obesity  Plan: MRI of LS-spine. She does have LS-spine films from 2001 which showed degenerative changes in the LS spine. Approval Number for MRI is [id number]  She will go back on ACE inhibitor. Discontinue ARB and monitor blood pressure closely. Take hydrocodone/APAP sparingly for pain.

## 2015-10-05 NOTE — Telephone Encounter (Signed)
Call pharmacy and discontinue this. Pt going back to original meds

## 2015-10-07 ENCOUNTER — Telehealth: Payer: Self-pay

## 2015-10-07 MED ORDER — LISINOPRIL 20 MG PO TABS: 20.0000 mg | ORAL_TABLET | Freq: Every day | ORAL | Status: DC

## 2015-10-07 MED ORDER — IRBESARTAN 300 MG PO TABS: 300.0000 mg | ORAL_TABLET | Freq: Every day | ORAL | Status: DC

## 2015-10-07 NOTE — Telephone Encounter (Signed)
Per Dr. Renold Genta patient is to stop the lisinopril and restart Avapro 300mg  daily; sent this to pharmacy. Patient has been notified.   3/2 readings: 155/86; 165/90; 178/100 3/3 readings: 184/103; 167/101; 165/91 3/4 readings: 185/109; 152/84 3/5 readings: 153/83; 167/90 3/6 readings: 178/105; 162/91; 175/104 3/7 readings: 158/94; 162/84 3/8 readings: 164/97; 156/84; 160/93 3/9 readings: 176/97; 184/101; 173/98; 156/87 3/10 readings: 177/99; 162/94

## 2015-10-07 NOTE — Telephone Encounter (Signed)
Patient states that her blood pressure is elevated: She states that she was told to call with readings she is going to fax them over. CB# [phone] She states that when she originally started taking blood pressure medications they worked really well and she is wondering if she needs to go back to taking them. She states that she thinks it was avapro and that it did great for her blood pressure until it just quit working.

## 2015-10-16 ENCOUNTER — Ambulatory Visit
Admission: RE | Admit: 2015-10-16 | Discharge: 2015-10-16 | Disposition: A | Discharge status: Home / Self Care | Payer: BLUE CROSS/BLUE SHIELD | Source: Ambulatory Visit | Attending: Internal Medicine | Admitting: Internal Medicine

## 2015-10-16 DIAGNOSIS — M5410 Radiculopathy, site unspecified: Secondary | ICD-10-CM

## 2015-10-18 ENCOUNTER — Telehealth: Payer: Self-pay

## 2015-10-18 DIAGNOSIS — M48061 Spinal stenosis, lumbar region without neurogenic claudication: Secondary | ICD-10-CM

## 2015-10-18 NOTE — Telephone Encounter (Signed)
-----   Message from Elby Showers, MD sent at 10/17/2015 10:26 AM EDT ----- Needs to see Neurosurgeon about back pain- Dr. Vertell Limber has lumbar spinal stenosis. Maybe can have injections but might need surgery.

## 2015-10-23 NOTE — Progress Notes (Signed)
   Subjective:    Patient ID: Gabriella [name], female    DOB: Nov 16, 1955, 60 y.o.   MRN: [id number]  HPI In  today for six-month recheck on essential hypertension, hyperlipidemia, obesity, controlled Type 2 diabetes mellitus, metabolic syndrome. Blood pressure is elevated today. 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 History of smoking. Hasn't been able to lose weight. Not really motivated to diet and exercise. Complains of chronic back pain. Of note, her mother had very hard to control hypertension. Patient has been on current regimen of Prinivil, furosemide, amlodipine, metoprolol for several years.   Review of Systems see above     Objective:   Physical Exam  Blood pressure today is 144/68. Chest clear. Cardiac exam regular rate and rhythm normal S1 and S2. Extremities without edema. Skin warm and dry. Nodes none. Chest clear to auscultation. Cardiac exam regular rate and rhythm normal S1 and S2. Extremities without edema.      Assessment & Plan:  Elevated blood pressure-change lisinopril to losartan 100 mg daily and return in 3 weeks. Continue other antihypertensive medications as previously prescribed  History of chronic low back pain  Obesity  Metabolic syndrome  Controlled type 2 diabetes mellitus. Hemoglobin A1c stable at 6.1%  Elevated SGPT at 41 probably due to fatty liver  Low HDL of 37  Hyperlipidemia-total, LDL cholesterol and triglycerides within normal limits.  Return in 3 weeks on new antihypertensive medication

## 2015-11-21 ENCOUNTER — Other Ambulatory Visit: Payer: Self-pay | Admitting: Internal Medicine

## 2015-11-30 DIAGNOSIS — M542 Cervicalgia: Secondary | ICD-10-CM | POA: Diagnosis not present

## 2015-11-30 DIAGNOSIS — M5412 Radiculopathy, cervical region: Secondary | ICD-10-CM | POA: Diagnosis not present

## 2015-11-30 DIAGNOSIS — M47812 Spondylosis without myelopathy or radiculopathy, cervical region: Secondary | ICD-10-CM | POA: Diagnosis not present

## 2015-11-30 DIAGNOSIS — M5416 Radiculopathy, lumbar region: Secondary | ICD-10-CM | POA: Diagnosis not present

## 2015-11-30 DIAGNOSIS — M129 Arthropathy, unspecified: Secondary | ICD-10-CM | POA: Diagnosis not present

## 2015-12-04 ENCOUNTER — Other Ambulatory Visit: Payer: Self-pay | Admitting: Internal Medicine

## 2015-12-08 ENCOUNTER — Other Ambulatory Visit: Payer: Self-pay | Admitting: Internal Medicine

## 2016-01-05 ENCOUNTER — Other Ambulatory Visit: Payer: Self-pay | Admitting: Internal Medicine

## 2016-01-06 ENCOUNTER — Other Ambulatory Visit: Payer: Self-pay | Admitting: Neurosurgery

## 2016-01-06 DIAGNOSIS — M25511 Pain in right shoulder: Secondary | ICD-10-CM

## 2016-01-06 DIAGNOSIS — M5412 Radiculopathy, cervical region: Secondary | ICD-10-CM

## 2016-01-16 ENCOUNTER — Ambulatory Visit
Admission: RE | Admit: 2016-01-16 | Discharge: 2016-01-16 | Disposition: A | Discharge status: Home / Self Care | Payer: BLUE CROSS/BLUE SHIELD | Source: Ambulatory Visit | Attending: Neurosurgery | Admitting: Neurosurgery

## 2016-01-16 DIAGNOSIS — M5412 Radiculopathy, cervical region: Secondary | ICD-10-CM

## 2016-01-16 DIAGNOSIS — M25511 Pain in right shoulder: Secondary | ICD-10-CM

## 2016-01-16 DIAGNOSIS — M4802 Spinal stenosis, cervical region: Secondary | ICD-10-CM | POA: Diagnosis not present

## 2016-01-16 DIAGNOSIS — M75121 Complete rotator cuff tear or rupture of right shoulder, not specified as traumatic: Secondary | ICD-10-CM | POA: Diagnosis not present

## 2016-01-25 DIAGNOSIS — M47812 Spondylosis without myelopathy or radiculopathy, cervical region: Secondary | ICD-10-CM | POA: Diagnosis not present

## 2016-01-25 DIAGNOSIS — M542 Cervicalgia: Secondary | ICD-10-CM | POA: Diagnosis not present

## 2016-01-25 DIAGNOSIS — M5412 Radiculopathy, cervical region: Secondary | ICD-10-CM | POA: Diagnosis not present

## 2016-01-25 DIAGNOSIS — M129 Arthropathy, unspecified: Secondary | ICD-10-CM | POA: Diagnosis not present

## 2016-01-26 ENCOUNTER — Telehealth: Payer: Self-pay | Admitting: Internal Medicine

## 2016-01-26 NOTE — Telephone Encounter (Signed)
Patient had MRI of shoulder.  Dr. Vertell Limber saw patient and went over the results of the MRI with patient.  He suggested patient be seen by Orthopedics.   And, stated she should see either Dr. Justice Britain or Dr. Tania Ade.  Patient wanted Dr. Verlene Mayer opinion, so she called for advice.  Dr. Renold Genta advised to go with Dr. Onnie Graham.    Called patient back and advised to tell Dr. Vertell Limber that she would like to see Dr. Onnie Graham.  Advised patient to ask to speak with Hailey, Dr. Melven Sartorius secretary and she'll help her make an appointment with Dr. Susie Cassette office.    Patient verbalized understanding of these instructions.   Patient states that she'll soon need pain medication.  Advised that Dr. Renold Genta will be out of town for 7/3-7/7.  She will return on 7/10.  Patient advised she'll call on 7/10 and will be fine with her medications until then.

## 2016-02-06 DIAGNOSIS — G8929 Other chronic pain: Secondary | ICD-10-CM | POA: Diagnosis not present

## 2016-02-06 DIAGNOSIS — M25511 Pain in right shoulder: Secondary | ICD-10-CM | POA: Diagnosis not present

## 2016-02-20 NOTE — Progress Notes (Signed)
Erroneous encounter

## 2016-02-22 NOTE — Progress Notes (Signed)
Erroneous encounter

## 2016-03-06 ENCOUNTER — Telehealth: Payer: Self-pay | Admitting: Internal Medicine

## 2016-03-06 MED ORDER — HYDROCODONE-ACETAMINOPHEN 10-325 MG PO TABS: 1.0000 | ORAL_TABLET | Freq: Three times a day (TID) | ORAL | 0 refills | Status: DC | PRN

## 2016-03-06 NOTE — Telephone Encounter (Signed)
Patient called asking for refill on hydrocodone/APAP 10/325. Written prescription for #90 with no refill. If patient needs to continue to have chronic pain medication, she will need to be referred to pain management physician as state laws will be changing as of January 1.

## 2016-03-09 ENCOUNTER — Telehealth: Payer: Self-pay | Admitting: Internal Medicine

## 2016-03-09 NOTE — Telephone Encounter (Signed)
Patient picked up her Rx today for Norco.  Spoke with patient about change in guidelines for pain management in 07/2016.  Advised patient that going forward she will need to be referred to Pain Management.  Patient advised that we will continue to see and treat her for this Rx until she is able to get in at [facility].  Provided handout for [facility].    Faxed patient's medical records and demographics to [facility] today at [phone].  Advised patient it could take up to 2-3 months to hear from their office with an appointment.  However, we will go ahead and get her records sent over to their office with a referral.    Also provided patient with a CPE appointment today as well.

## 2016-03-21 ENCOUNTER — Other Ambulatory Visit: Payer: Self-pay | Admitting: Internal Medicine

## 2016-04-20 ENCOUNTER — Other Ambulatory Visit: Payer: Self-pay | Admitting: Internal Medicine

## 2016-05-01 ENCOUNTER — Other Ambulatory Visit: Payer: BLUE CROSS/BLUE SHIELD | Admitting: Internal Medicine

## 2016-05-03 ENCOUNTER — Encounter: Payer: BLUE CROSS/BLUE SHIELD | Admitting: Internal Medicine

## 2016-05-13 ENCOUNTER — Other Ambulatory Visit: Payer: Self-pay | Admitting: Internal Medicine

## 2016-05-15 ENCOUNTER — Other Ambulatory Visit: Payer: BLUE CROSS/BLUE SHIELD | Admitting: Internal Medicine

## 2016-05-15 DIAGNOSIS — Z0000 Encounter for general adult medical examination without abnormal findings: Secondary | ICD-10-CM | POA: Diagnosis not present

## 2016-05-15 DIAGNOSIS — R7302 Impaired glucose tolerance (oral): Secondary | ICD-10-CM

## 2016-05-15 NOTE — Addendum Note (Signed)
Addended by: Tamera Punt C on: 05/15/2016 09:46 AM   Modules accepted: Orders

## 2016-05-16 LAB — CBC WITH DIFFERENTIAL/PLATELET
BASOS ABS: 0 {cells}/uL (ref 0–200)
BASOS PCT: 0 %
EOS ABS: 73 {cells}/uL (ref 15–500)
Eosinophils Relative: 1 %
HEMATOCRIT: 49.1 % — AB (ref 35.0–45.0)
HEMOGLOBIN: 16.4 g/dL — AB (ref 11.7–15.5)
LYMPHS ABS: 2409 {cells}/uL (ref 850–3900)
Lymphocytes Relative: 33 %
MCH: 33.2 pg — AB (ref 27.0–33.0)
MCHC: 33.4 g/dL (ref 32.0–36.0)
MCV: 99.4 fL (ref 80.0–100.0)
MONO ABS: 584 {cells}/uL (ref 200–950)
MONOS PCT: 8 %
MPV: 9.2 fL (ref 7.5–12.5)
NEUTROS ABS: 4234 {cells}/uL (ref 1500–7800)
Neutrophils Relative %: 58 %
Platelets: 245 10*3/uL (ref 140–400)
RBC: 4.94 MIL/uL (ref 3.80–5.10)
RDW: 13.9 % (ref 11.0–15.0)
WBC: 7.3 10*3/uL (ref 3.8–10.8)

## 2016-05-16 LAB — COMPREHENSIVE METABOLIC PANEL
ALBUMIN: 3.9 g/dL (ref 3.6–5.1)
ALT: 38 U/L — AB (ref 6–29)
AST: 28 U/L (ref 10–35)
Alkaline Phosphatase: 77 U/L (ref 33–130)
BILIRUBIN TOTAL: 0.6 mg/dL (ref 0.2–1.2)
BUN: 15 mg/dL (ref 7–25)
CHLORIDE: 106 mmol/L (ref 98–110)
CO2: 26 mmol/L (ref 20–31)
Calcium: 8.8 mg/dL (ref 8.6–10.4)
Creat: 0.55 mg/dL (ref 0.50–1.05)
Glucose, Bld: 123 mg/dL — ABNORMAL HIGH (ref 65–99)
POTASSIUM: 3.5 mmol/L (ref 3.5–5.3)
Sodium: 141 mmol/L (ref 135–146)
TOTAL PROTEIN: 6.8 g/dL (ref 6.1–8.1)

## 2016-05-16 LAB — LIPID PANEL
Cholesterol: 158 mg/dL (ref 125–200)
HDL: 36 mg/dL — AB (ref >=46)
LDL CALC: 97 mg/dL (ref <130)
Total CHOL/HDL Ratio: 4.4 Ratio (ref <=5.0)
Triglycerides: 124 mg/dL (ref <150)
VLDL: 25 mg/dL (ref <30)

## 2016-05-16 LAB — TSH: TSH: 1.48 m[IU]/L

## 2016-05-16 LAB — HEMOGLOBIN A1C
Hgb A1c MFr Bld: 5.8 % — ABNORMAL HIGH (ref <5.7)
MEAN PLASMA GLUCOSE: 120 mg/dL

## 2016-05-16 LAB — VITAMIN D 25 HYDROXY (VIT D DEFICIENCY, FRACTURES): Vit D, 25-Hydroxy: 37 ng/mL (ref 30–100)

## 2016-05-17 ENCOUNTER — Other Ambulatory Visit (HOSPITAL_COMMUNITY)
Admission: RE | Admit: 2016-05-17 | Discharge: 2016-05-17 | Disposition: A | Discharge status: Home / Self Care | Payer: BLUE CROSS/BLUE SHIELD | Source: Ambulatory Visit | Attending: Internal Medicine | Admitting: Internal Medicine

## 2016-05-17 ENCOUNTER — Encounter: Payer: Self-pay | Admitting: Internal Medicine

## 2016-05-17 ENCOUNTER — Ambulatory Visit (INDEPENDENT_AMBULATORY_CARE_PROVIDER_SITE_OTHER): Payer: BLUE CROSS/BLUE SHIELD | Admitting: Internal Medicine

## 2016-05-17 VITALS — BP 134/86 | HR 71 | Temp 97.7°F | Ht 65.0 in | Wt 260.0 lb

## 2016-05-17 DIAGNOSIS — Z8711 Personal history of peptic ulcer disease: Secondary | ICD-10-CM | POA: Diagnosis not present

## 2016-05-17 DIAGNOSIS — M48061 Spinal stenosis, lumbar region without neurogenic claudication: Secondary | ICD-10-CM | POA: Diagnosis not present

## 2016-05-17 DIAGNOSIS — M25572 Pain in left ankle and joints of left foot: Secondary | ICD-10-CM

## 2016-05-17 DIAGNOSIS — G8929 Other chronic pain: Secondary | ICD-10-CM

## 2016-05-17 DIAGNOSIS — E6609 Other obesity due to excess calories: Secondary | ICD-10-CM | POA: Diagnosis not present

## 2016-05-17 DIAGNOSIS — K580 Irritable bowel syndrome with diarrhea: Secondary | ICD-10-CM

## 2016-05-17 DIAGNOSIS — R7302 Impaired glucose tolerance (oral): Secondary | ICD-10-CM

## 2016-05-17 DIAGNOSIS — Z01419 Encounter for gynecological examination (general) (routine) without abnormal findings: Secondary | ICD-10-CM | POA: Insufficient documentation

## 2016-05-17 DIAGNOSIS — F17200 Nicotine dependence, unspecified, uncomplicated: Secondary | ICD-10-CM | POA: Diagnosis not present

## 2016-05-17 DIAGNOSIS — Z0000 Encounter for general adult medical examination without abnormal findings: Secondary | ICD-10-CM | POA: Diagnosis not present

## 2016-05-17 DIAGNOSIS — IMO0001 Reserved for inherently not codable concepts without codable children: Secondary | ICD-10-CM

## 2016-05-17 LAB — POCT URINALYSIS DIPSTICK
Bilirubin, UA: NEGATIVE
GLUCOSE UA: NEGATIVE
Ketones, UA: NEGATIVE
Leukocytes, UA: NEGATIVE
NITRITE UA: NEGATIVE
PH UA: 5
Protein, UA: NEGATIVE
SPEC GRAV UA: 1.01
UROBILINOGEN UA: NEGATIVE

## 2016-05-17 MED ORDER — DICYCLOMINE HCL 20 MG PO TABS: 20.0000 mg | ORAL_TABLET | Freq: Three times a day (TID) | ORAL | 2 refills | Status: DC

## 2016-05-17 MED ORDER — HYDROCODONE-ACETAMINOPHEN 10-325 MG PO TABS: 1.0000 | ORAL_TABLET | Freq: Three times a day (TID) | ORAL | 0 refills | Status: DC | PRN

## 2016-05-17 NOTE — Progress Notes (Signed)
   Subjective:    Patient ID: Gabriella [name], female    DOB: 31-Oct-1955, 60 y.o.   MRN: [id number]  HPI  60 year old Female for health maintenance exam and evaluation of medical issues.  Has only lost 4 pounds since August 2016.   She is asking for information about [facility]. She needs to call and research this herself.  She went to see orthopedist grams per orthopedics. Was told she might need a right shoulder replacement. Continues to have issues with left ankle. Was told she might need MRI of the left ankle.  I have given her phone numbers for orthopedic departments at [facility] and you for her to contact regarding another opinion. She takes hydrocodone/APAP. Sparingly for musculoskeletal pain.  She needs to get these orthopedic issues resolved.  She has not been motivated to diet and exercise.  Review of Systems  Constitutional: Positive for fatigue.  Respiratory: Negative.   Cardiovascular: Negative.   Gastrointestinal:       Diarrhea after meals  Genitourinary: Negative.   Musculoskeletal: Positive for arthralgias.  All other systems reviewed and are negative.      Objective:   Physical Exam  Constitutional: She is oriented to person, place, and time. She appears well-developed and well-nourished.  HENT:  Head: Normocephalic and atraumatic.  Right Ear: External ear normal.  Left Ear: External ear normal.  Mouth/Throat: Oropharynx is clear and moist.  Eyes: Conjunctivae and EOM are normal. Pupils are equal, round, and reactive to light.  Neck: Neck supple. No JVD present. No thyromegaly present.  Cardiovascular: Normal rate, regular rhythm, normal heart sounds and intact distal pulses.   No murmur heard. Pulmonary/Chest: Effort normal and breath sounds normal. She has no wheezes. She has no rales.  Breasts normal female  Abdominal: Soft. Bowel sounds are normal. There is no tenderness. There is no rebound and no guarding.  Lymphadenopathy:    She has no cervical adenopathy.    Neurological: She is alert and oriented to person, place, and time. She has normal reflexes. No cranial nerve deficit. Coordination normal.  Skin: Skin is warm and dry. No rash noted.  Psychiatric: She has a normal mood and affect. Her behavior is normal. Judgment and thought content normal.  Vitals reviewed.         Assessment & Plan:   Irritable Bowel syndrome-trial of Bentyl 20 mg before meals. Watch diet.  Chronic left ankle pain-consult orthopedist  Right shoulder arthropathy-consult orthopedist   Impaired glucose tolerance-continue same regimen encouraged diet exercise and weight loss  Obesity  Essential hypertension-stable  Hyperlipidemia-lipid panel /liver functions are within normal limits  Plan: Refill hydrocodone/APAP  #. 90 with no refill. She's been referred to chronic pain management Center. She does take these very sparingly and does not abuse them. However, I think she really needs to pursue having right shoulder evaluated with possible surgery and also left ankle evaluated. She says arthritis runs in her family that her grandmother was unable to walk at age 59 due to severe arthritis.  Encourage diet exercise and weight loss. Return in 6 months.

## 2016-05-18 ENCOUNTER — Other Ambulatory Visit: Payer: Self-pay | Admitting: Internal Medicine

## 2016-05-21 LAB — CYTOLOGY - PAP: DIAGNOSIS: NEGATIVE

## 2016-05-29 NOTE — Patient Instructions (Addendum)
Continue same medications and return in 6 months. Hydrocodone/APAP refilled #90. Refer to pain management Center. May consider seeing orthopedist at [facility] or [place] for chronic left ankle pain and right shoulder arthropathy. Continue to work on diet exercise and weight loss. Try Bentyl for irritable bowel syndrome.

## 2016-06-03 ENCOUNTER — Other Ambulatory Visit: Payer: Self-pay | Admitting: Internal Medicine

## 2016-06-13 ENCOUNTER — Telehealth: Payer: Self-pay | Admitting: Internal Medicine

## 2016-06-13 NOTE — Telephone Encounter (Signed)
Fax from [facility] stating patient was approved and appointment scheduled for 08/01/16 with Dr. Hardin Negus.  Patient has been notified by their office.

## 2016-06-25 ENCOUNTER — Other Ambulatory Visit: Payer: Self-pay | Admitting: Internal Medicine

## 2016-06-25 MED ORDER — PANTOPRAZOLE SODIUM 40 MG PO TBEC: 40.0000 mg | DELAYED_RELEASE_TABLET | Freq: Every day | ORAL | 3 refills | Status: DC

## 2016-06-25 MED ORDER — METOPROLOL SUCCINATE ER 50 MG PO TB24: ORAL_TABLET | ORAL | 3 refills | Status: DC

## 2016-06-28 ENCOUNTER — Telehealth: Payer: Self-pay | Admitting: Internal Medicine

## 2016-06-28 ENCOUNTER — Other Ambulatory Visit: Payer: Self-pay | Admitting: Internal Medicine

## 2016-06-28 ENCOUNTER — Encounter (HOSPITAL_COMMUNITY): Payer: Self-pay

## 2016-06-28 ENCOUNTER — Encounter (HOSPITAL_COMMUNITY)
Admission: RE | Admit: 2016-06-28 | Discharge: 2016-06-28 | Disposition: A | Discharge status: Home / Self Care | Payer: BLUE CROSS/BLUE SHIELD | Source: Ambulatory Visit | Attending: Orthopedic Surgery | Admitting: Orthopedic Surgery

## 2016-06-28 DIAGNOSIS — R9431 Abnormal electrocardiogram [ECG] [EKG]: Secondary | ICD-10-CM | POA: Insufficient documentation

## 2016-06-28 DIAGNOSIS — E119 Type 2 diabetes mellitus without complications: Secondary | ICD-10-CM | POA: Diagnosis not present

## 2016-06-28 DIAGNOSIS — Z01812 Encounter for preprocedural laboratory examination: Secondary | ICD-10-CM | POA: Diagnosis not present

## 2016-06-28 DIAGNOSIS — E781 Pure hyperglyceridemia: Secondary | ICD-10-CM | POA: Diagnosis not present

## 2016-06-28 DIAGNOSIS — Z0183 Encounter for blood typing: Secondary | ICD-10-CM | POA: Insufficient documentation

## 2016-06-28 DIAGNOSIS — I440 Atrioventricular block, first degree: Secondary | ICD-10-CM | POA: Insufficient documentation

## 2016-06-28 DIAGNOSIS — F17200 Nicotine dependence, unspecified, uncomplicated: Secondary | ICD-10-CM | POA: Diagnosis not present

## 2016-06-28 DIAGNOSIS — Z859 Personal history of malignant neoplasm, unspecified: Secondary | ICD-10-CM | POA: Diagnosis not present

## 2016-06-28 DIAGNOSIS — M12811 Other specific arthropathies, not elsewhere classified, right shoulder: Secondary | ICD-10-CM | POA: Diagnosis not present

## 2016-06-28 DIAGNOSIS — Z01818 Encounter for other preprocedural examination: Secondary | ICD-10-CM | POA: Insufficient documentation

## 2016-06-28 DIAGNOSIS — I10 Essential (primary) hypertension: Secondary | ICD-10-CM | POA: Insufficient documentation

## 2016-06-28 HISTORY — DX: Headache, unspecified: R51.9

## 2016-06-28 HISTORY — DX: Nausea with vomiting, unspecified: R11.2

## 2016-06-28 HISTORY — DX: Headache: R51

## 2016-06-28 HISTORY — DX: Malignant (primary) neoplasm, unspecified: C80.1

## 2016-06-28 HISTORY — DX: Other specified postprocedural states: Z98.890

## 2016-06-28 LAB — CBC
HCT: 47.8 % — ABNORMAL HIGH (ref 36.0–46.0)
HEMOGLOBIN: 16.6 g/dL — AB (ref 12.0–15.0)
MCH: 34.2 pg — AB (ref 26.0–34.0)
MCHC: 34.7 g/dL (ref 30.0–36.0)
MCV: 98.4 fL (ref 78.0–100.0)
PLATELETS: 235 10*3/uL (ref 150–400)
RBC: 4.86 MIL/uL (ref 3.87–5.11)
RDW: 13.6 % (ref 11.5–15.5)
WBC: 7.5 10*3/uL (ref 4.0–10.5)

## 2016-06-28 LAB — BASIC METABOLIC PANEL
Anion gap: 10 (ref 5–15)
BUN: 16 mg/dL (ref 6–20)
CALCIUM: 9 mg/dL (ref 8.9–10.3)
CHLORIDE: 107 mmol/L (ref 101–111)
CO2: 22 mmol/L (ref 22–32)
CREATININE: 0.64 mg/dL (ref 0.44–1.00)
Glucose, Bld: 108 mg/dL — ABNORMAL HIGH (ref 65–99)
Potassium: 4.1 mmol/L (ref 3.5–5.1)
SODIUM: 139 mmol/L (ref 135–145)

## 2016-06-28 LAB — TYPE AND SCREEN
ABO/RH(D): O POS
ANTIBODY SCREEN: NEGATIVE

## 2016-06-28 LAB — GLUCOSE, CAPILLARY: GLUCOSE-CAPILLARY: 111 mg/dL — AB (ref 65–99)

## 2016-06-28 LAB — ABO/RH: ABO/RH(D): O POS

## 2016-06-28 LAB — SURGICAL PCR SCREEN
MRSA, PCR: NEGATIVE
Staphylococcus aureus: NEGATIVE

## 2016-06-28 MED ORDER — HYDROCODONE-ACETAMINOPHEN 10-325 MG PO TABS: 1.0000 | ORAL_TABLET | Freq: Three times a day (TID) | ORAL | 0 refills | Status: DC | PRN

## 2016-06-28 NOTE — Pre-Procedure Instructions (Signed)
[facility]  06/28/2016      [facility] [place], [place] [address] [place] [address] Phone: [phone] Fax: [phone]  [id number] [place], [place] [address] Phone: [phone] Fax: [phone]    Your procedure is scheduled on  Thursday  07/05/16  Report to [facility] Admitting at 1030 A.M.  Call this number if you have problems the morning of surgery:  [phone]   Remember:  Do not eat food or drink liquids after midnight.  Take these medicines the morning of surgery with A SIP OF WATER  ALLOPURINOL, AMLODIPINE (NORVASC), HYDROCODONE IF NEEDED, METOPROLOL (TOPROL), PANTOPRAZOLE (PROTONIX)      (STOP 7 DAYS PRIOR TO SURGERY ASPIRIN OR ASPIRIN PRODUCTS, IBUPROFEN/ ADVIL/ MOTRIN/ ALEVE, HERBAL MEDICINES, OMEGA 3 FISH OIL)   Do not wear jewelry, make-up or nail polish.  Do not wear lotions, powders, or perfumes, or deoderant.  Do not shave 48 hours prior to surgery.  Men may shave face and neck.  Do not bring valuables to the hospital.  [facility] is not responsible for any belongings or valuables.  Contacts, dentures or bridgework may not be worn into surgery.  Leave your suitcase in the car.  After surgery it may be brought to your room.  For patients admitted to the hospital, discharge time will be determined by your treatment team.  Patients discharged the day of surgery will not be allowed to drive home.   Name and phone number of your driver:    Special instructions:  [facility] - Preparing for Surgery  Before surgery, you can play an important role.  Because skin is not sterile, your skin needs to be as free of germs as possible.  You can reduce the number of germs on you skin by washing with CHG (chlorahexidine gluconate) soap before surgery.  CHG is an antiseptic cleaner which kills germs and bonds with the skin to continue killing germs  even after washing.  Please DO NOT use if you have an allergy to CHG or antibacterial soaps.  If your skin becomes reddened/irritated stop using the CHG and inform your nurse when you arrive at Short Stay.  Do not shave (including legs and underarms) for at least 48 hours prior to the first CHG shower.  You may shave your face.  Please follow these instructions carefully:   1.  Shower with CHG Soap the night before surgery and the                                morning of Surgery.  2.  If you choose to wash your hair, wash your hair first as usual with your       normal shampoo.  3.  After you shampoo, rinse your hair and body thoroughly to remove the                      Shampoo.  4.  Use CHG as you would any other liquid soap.  You can apply chg directly       to the skin and wash gently with scrungie or a clean washcloth.  5.  Apply the CHG Soap to your body ONLY FROM THE NECK DOWN.        Do not use on open wounds or open sores.  Avoid contact with your eyes,       ears, mouth and genitals (private parts).  Wash genitals (private parts)       with your normal soap.  6.  Wash thoroughly, paying special attention to the area where your surgery        will be performed.  7.  Thoroughly rinse your body with warm water from the neck down.  8.  DO NOT shower/wash with your normal soap after using and rinsing off       the CHG Soap.  9.  Pat yourself dry with a clean towel.            10.  Wear clean pajamas.            11.  Place clean sheets on your bed the night of your first shower and do not        sleep with pets.  Day of Surgery  Do not apply any lotions/deoderants the morning of surgery.  Please wear clean clothes to the hospital/surgery center.    Please read over the following fact sheets that you were given. MRSA Information and Surgical Site Infection Prevention

## 2016-06-28 NOTE — Telephone Encounter (Signed)
Hydrocodone refilled but patient needs to follow up with pain management.

## 2016-06-28 NOTE — Telephone Encounter (Signed)
Please refill x one year 

## 2016-06-28 NOTE — Telephone Encounter (Signed)
Please refill once 

## 2016-06-28 NOTE — Telephone Encounter (Signed)
Patient would like to get a refill on her Norco 10-325mg .  She is scheduled for pre-op this morning for surgery for her shoulder.  She would like to pick this up this morning around 11 if at all possible while she's in town for her pre-op.    *She also has a request for her test strips to be sent to the pharmacy too.    Thanks.

## 2016-06-29 NOTE — Progress Notes (Signed)
Anesthesia Chart Review: Patient is a 60 year old female scheduled for right shoulder arthroplasty on 07/05/16 by Dr. Onnie Graham.  History includes smoking, post-operative N/V, dependent edema, HTN, hypertriglyceridemia, PUD, DM2, facial cancer. BMI is consistent with morbid obesity.  PCP is Dr. Tedra Senegal, last visit 05/17/16 for wellness exam. Negative cardiopulmonary ROS at that visit.  BP 132/63   Pulse 71   Temp 36.7 C (Oral)   Resp 18   Ht 5\' 5"  (1.651 m)   Wt 261 lb 4 oz (118.5 kg)   SpO2 98%   BMI 43.47 kg/m   06/28/16 EKG: SR with first degree AV block, LAD, low voltage QRS, inferior infarct (age undetermined). There are no comparison tracings in [employer] or [employer].   Preoperative labs noted. Cr 0.64. Glucose 108. H/H 16.6/47.8. T&S done. A1c on 05/15/16 was 5.8.  Reviewed above with anesthesiologist Dr. Oletta Lamas. Repeat EKG on the day of surgery and evaluate on the day of surgery. Question possible inferior infarct changes related to lead placement? No reported CAD/MI history. She is a smoker. DM is well controlled. No CV symptoms reported. Definitive anesthesia plan on the day of surgery.   George Hugh [facility] Short Stay Center/Anesthesiology Phone [phone] 06/29/2016 3:40 PM

## 2016-07-04 MED ORDER — TRANEXAMIC ACID 1000 MG/10ML IV SOLN
1000.0000 mg | INTRAVENOUS | Status: AC
  Filled 2016-07-04 (×2): qty 10

## 2016-07-05 ENCOUNTER — Encounter (HOSPITAL_COMMUNITY)
Admission: RE | Disposition: A | Discharge status: Home / Self Care | Payer: Self-pay | Source: Ambulatory Visit | Attending: Orthopedic Surgery

## 2016-07-05 ENCOUNTER — Ambulatory Visit (HOSPITAL_COMMUNITY): Payer: BLUE CROSS/BLUE SHIELD | Admitting: Vascular Surgery

## 2016-07-05 ENCOUNTER — Observation Stay (HOSPITAL_COMMUNITY)
Admission: RE | Admit: 2016-07-05 | Discharge: 2016-07-06 | Disposition: A | Discharge status: Home / Self Care | Payer: BLUE CROSS/BLUE SHIELD | Source: Ambulatory Visit | Attending: Orthopedic Surgery | Admitting: Orthopedic Surgery

## 2016-07-05 ENCOUNTER — Encounter (HOSPITAL_COMMUNITY): Payer: Self-pay | Admitting: Surgery

## 2016-07-05 DIAGNOSIS — Z85828 Personal history of other malignant neoplasm of skin: Secondary | ICD-10-CM | POA: Diagnosis not present

## 2016-07-05 DIAGNOSIS — E119 Type 2 diabetes mellitus without complications: Secondary | ICD-10-CM | POA: Insufficient documentation

## 2016-07-05 DIAGNOSIS — F17210 Nicotine dependence, cigarettes, uncomplicated: Secondary | ICD-10-CM | POA: Diagnosis not present

## 2016-07-05 DIAGNOSIS — I10 Essential (primary) hypertension: Secondary | ICD-10-CM | POA: Insufficient documentation

## 2016-07-05 DIAGNOSIS — N393 Stress incontinence (female) (male): Secondary | ICD-10-CM | POA: Diagnosis not present

## 2016-07-05 DIAGNOSIS — Z6841 Body Mass Index (BMI) 40.0 and over, adult: Secondary | ICD-10-CM | POA: Diagnosis not present

## 2016-07-05 DIAGNOSIS — K279 Peptic ulcer, site unspecified, unspecified as acute or chronic, without hemorrhage or perforation: Secondary | ICD-10-CM | POA: Insufficient documentation

## 2016-07-05 DIAGNOSIS — Z7984 Long term (current) use of oral hypoglycemic drugs: Secondary | ICD-10-CM | POA: Diagnosis not present

## 2016-07-05 DIAGNOSIS — E781 Pure hyperglyceridemia: Secondary | ICD-10-CM | POA: Insufficient documentation

## 2016-07-05 DIAGNOSIS — G8918 Other acute postprocedural pain: Secondary | ICD-10-CM | POA: Diagnosis not present

## 2016-07-05 DIAGNOSIS — M12811 Other specific arthropathies, not elsewhere classified, right shoulder: Principal | ICD-10-CM | POA: Insufficient documentation

## 2016-07-05 DIAGNOSIS — Z96611 Presence of right artificial shoulder joint: Secondary | ICD-10-CM

## 2016-07-05 DIAGNOSIS — M129 Arthropathy, unspecified: Secondary | ICD-10-CM | POA: Diagnosis not present

## 2016-07-05 DIAGNOSIS — M19011 Primary osteoarthritis, right shoulder: Secondary | ICD-10-CM | POA: Diagnosis not present

## 2016-07-05 DIAGNOSIS — M75101 Unspecified rotator cuff tear or rupture of right shoulder, not specified as traumatic: Secondary | ICD-10-CM | POA: Diagnosis not present

## 2016-07-05 HISTORY — PX: REVERSE SHOULDER ARTHROPLASTY: SHX5054

## 2016-07-05 LAB — GLUCOSE, CAPILLARY
GLUCOSE-CAPILLARY: 133 mg/dL — AB (ref 65–99)
GLUCOSE-CAPILLARY: 137 mg/dL — AB (ref 65–99)
Glucose-Capillary: 120 mg/dL — ABNORMAL HIGH (ref 65–99)

## 2016-07-05 SURGERY — ARTHROPLASTY, SHOULDER, TOTAL, REVERSE
Anesthesia: General | Site: Shoulder | Laterality: Right

## 2016-07-05 MED ORDER — FUROSEMIDE 40 MG PO TABS
40.0000 mg | ORAL_TABLET | Freq: Every day | ORAL | Status: DC
  Administered 2016-07-05: 40 mg via ORAL
  Filled 2016-07-05: qty 1

## 2016-07-05 MED ORDER — PHENOL 1.4 % MT LIQD: 1.0000 | OROMUCOSAL | Status: DC | PRN

## 2016-07-05 MED ORDER — SUGAMMADEX SODIUM 200 MG/2ML IV SOLN
INTRAVENOUS | Status: DC | PRN
  Administered 2016-07-05: 250 mg via INTRAVENOUS

## 2016-07-05 MED ORDER — PROPOFOL 10 MG/ML IV BOLUS
INTRAVENOUS | Status: DC | PRN
  Administered 2016-07-05: 200 mg via INTRAVENOUS

## 2016-07-05 MED ORDER — BUPIVACAINE-EPINEPHRINE (PF) 0.5% -1:200000 IJ SOLN
INTRAMUSCULAR | Status: DC | PRN
  Administered 2016-07-05: 25 mL via PERINEURAL

## 2016-07-05 MED ORDER — ONDANSETRON HCL 4 MG/2ML IJ SOLN
INTRAMUSCULAR | Status: AC
  Filled 2016-07-05: qty 2

## 2016-07-05 MED ORDER — ACETAMINOPHEN 650 MG RE SUPP: 650.0000 mg | Freq: Four times a day (QID) | RECTAL | Status: DC | PRN

## 2016-07-05 MED ORDER — LACTATED RINGERS IV SOLN
INTRAVENOUS | Status: DC | PRN
  Administered 2016-07-05 (×2): via INTRAVENOUS

## 2016-07-05 MED ORDER — ALBUTEROL SULFATE (2.5 MG/3ML) 0.083% IN NEBU
INHALATION_SOLUTION | RESPIRATORY_TRACT | Status: AC
  Filled 2016-07-05: qty 3

## 2016-07-05 MED ORDER — LIDOCAINE 2% (20 MG/ML) 5 ML SYRINGE
INTRAMUSCULAR | Status: AC
  Filled 2016-07-05: qty 5

## 2016-07-05 MED ORDER — SIMVASTATIN 20 MG PO TABS
20.0000 mg | ORAL_TABLET | Freq: Every day | ORAL | Status: DC
  Administered 2016-07-05: 20 mg via ORAL
  Filled 2016-07-05: qty 1

## 2016-07-05 MED ORDER — LACTATED RINGERS IV SOLN
INTRAVENOUS | Status: DC
  Administered 2016-07-05: 11:00:00 via INTRAVENOUS

## 2016-07-05 MED ORDER — POTASSIUM CHLORIDE CRYS ER 20 MEQ PO TBCR
20.0000 meq | EXTENDED_RELEASE_TABLET | Freq: Three times a day (TID) | ORAL | Status: DC
  Administered 2016-07-05: 20 meq via ORAL
  Filled 2016-07-05: qty 1

## 2016-07-05 MED ORDER — PROPOFOL 10 MG/ML IV BOLUS
INTRAVENOUS | Status: AC
  Filled 2016-07-05: qty 20

## 2016-07-05 MED ORDER — CEFAZOLIN SODIUM 10 G IJ SOLR
3.0000 g | INTRAMUSCULAR | Status: AC
  Administered 2016-07-05: 3 g via INTRAVENOUS
  Filled 2016-07-05: qty 3000

## 2016-07-05 MED ORDER — FENTANYL CITRATE (PF) 100 MCG/2ML IJ SOLN
INTRAMUSCULAR | Status: AC
  Administered 2016-07-05: 50 ug
  Filled 2016-07-05: qty 2

## 2016-07-05 MED ORDER — ALBUTEROL SULFATE (2.5 MG/3ML) 0.083% IN NEBU
2.5000 mg | INHALATION_SOLUTION | RESPIRATORY_TRACT | Status: DC | PRN
  Administered 2016-07-05: 2.5 mg via RESPIRATORY_TRACT
  Filled 2016-07-05: qty 3

## 2016-07-05 MED ORDER — EPHEDRINE SULFATE-NACL 50-0.9 MG/10ML-% IV SOSY
PREFILLED_SYRINGE | INTRAVENOUS | Status: DC | PRN
Start: 2016-07-05 — End: 2016-07-05
  Administered 2016-07-05: 5 mg via INTRAVENOUS
  Administered 2016-07-05: 10 mg via INTRAVENOUS
  Administered 2016-07-05 (×2): 5 mg via INTRAVENOUS

## 2016-07-05 MED ORDER — PROMETHAZINE HCL 25 MG/ML IJ SOLN: 6.2500 mg | INTRAMUSCULAR | Status: DC | PRN

## 2016-07-05 MED ORDER — PHENYLEPHRINE 40 MCG/ML (10ML) SYRINGE FOR IV PUSH (FOR BLOOD PRESSURE SUPPORT)
PREFILLED_SYRINGE | INTRAVENOUS | Status: AC
  Filled 2016-07-05: qty 10

## 2016-07-05 MED ORDER — MIDAZOLAM HCL 5 MG/5ML IJ SOLN
INTRAMUSCULAR | Status: DC | PRN
Start: 2016-07-05 — End: 2016-07-05
  Administered 2016-07-05: 1 mg via INTRAVENOUS

## 2016-07-05 MED ORDER — HYDROMORPHONE HCL 1 MG/ML IJ SOLN: 0.2500 mg | INTRAMUSCULAR | Status: DC | PRN

## 2016-07-05 MED ORDER — PHENYLEPHRINE HCL 10 MG/ML IJ SOLN
INTRAVENOUS | Status: DC | PRN
  Administered 2016-07-05: 50 ug/min via INTRAVENOUS
  Administered 2016-07-05: 100 ug/min via INTRAVENOUS

## 2016-07-05 MED ORDER — METOCLOPRAMIDE HCL 5 MG/ML IJ SOLN
5.0000 mg | Freq: Three times a day (TID) | INTRAMUSCULAR | Status: DC | PRN
  Administered 2016-07-05: 10 mg via INTRAVENOUS
  Filled 2016-07-05: qty 2

## 2016-07-05 MED ORDER — EPHEDRINE 5 MG/ML INJ
INTRAVENOUS | Status: AC
  Filled 2016-07-05: qty 10

## 2016-07-05 MED ORDER — MIDAZOLAM HCL 2 MG/2ML IJ SOLN
INTRAMUSCULAR | Status: AC
  Administered 2016-07-05: 1 mg
  Filled 2016-07-05: qty 2

## 2016-07-05 MED ORDER — DICYCLOMINE HCL 20 MG PO TABS
20.0000 mg | ORAL_TABLET | Freq: Three times a day (TID) | ORAL | Status: DC | PRN
  Filled 2016-07-05: qty 1

## 2016-07-05 MED ORDER — ONDANSETRON HCL 4 MG/2ML IJ SOLN
INTRAMUSCULAR | Status: DC | PRN
Start: 2016-07-05 — End: 2016-07-05
  Administered 2016-07-05: 4 mg via INTRAVENOUS

## 2016-07-05 MED ORDER — METOCLOPRAMIDE HCL 5 MG PO TABS: 5.0000 mg | ORAL_TABLET | Freq: Three times a day (TID) | ORAL | Status: DC | PRN

## 2016-07-05 MED ORDER — FENTANYL CITRATE (PF) 100 MCG/2ML IJ SOLN
INTRAMUSCULAR | Status: AC
  Filled 2016-07-05: qty 4

## 2016-07-05 MED ORDER — AMLODIPINE BESYLATE 10 MG PO TABS
10.0000 mg | ORAL_TABLET | Freq: Every day | ORAL | Status: DC
  Filled 2016-07-05: qty 1

## 2016-07-05 MED ORDER — METOPROLOL SUCCINATE ER 25 MG PO TB24: 50.0000 mg | ORAL_TABLET | Freq: Every day | ORAL | Status: DC

## 2016-07-05 MED ORDER — TRANEXAMIC ACID 1000 MG/10ML IV SOLN
1000.0000 mg | INTRAVENOUS | Status: AC
  Administered 2016-07-05: 1000 mg via INTRAVENOUS

## 2016-07-05 MED ORDER — ALLOPURINOL 300 MG PO TABS
300.0000 mg | ORAL_TABLET | Freq: Every day | ORAL | Status: DC
  Filled 2016-07-05: qty 1

## 2016-07-05 MED ORDER — FENTANYL CITRATE (PF) 100 MCG/2ML IJ SOLN
INTRAMUSCULAR | Status: DC | PRN
  Administered 2016-07-05: 50 ug via INTRAVENOUS
  Administered 2016-07-05: 100 ug via INTRAVENOUS

## 2016-07-05 MED ORDER — KETOROLAC TROMETHAMINE 15 MG/ML IJ SOLN
15.0000 mg | Freq: Four times a day (QID) | INTRAMUSCULAR | Status: DC
  Administered 2016-07-05 – 2016-07-06 (×3): 15 mg via INTRAVENOUS
  Filled 2016-07-05 (×3): qty 1

## 2016-07-05 MED ORDER — DIAZEPAM 5 MG PO TABS
5.0000 mg | ORAL_TABLET | Freq: Four times a day (QID) | ORAL | Status: DC | PRN
  Administered 2016-07-05 – 2016-07-06 (×2): 5 mg via ORAL
  Filled 2016-07-05 (×2): qty 1

## 2016-07-05 MED ORDER — MAGNESIUM CITRATE PO SOLN: 1.0000 | Freq: Once | ORAL | Status: DC | PRN

## 2016-07-05 MED ORDER — BISACODYL 5 MG PO TBEC: 5.0000 mg | DELAYED_RELEASE_TABLET | Freq: Every day | ORAL | Status: DC | PRN

## 2016-07-05 MED ORDER — ONDANSETRON HCL 4 MG PO TABS: 4.0000 mg | ORAL_TABLET | Freq: Four times a day (QID) | ORAL | Status: DC | PRN

## 2016-07-05 MED ORDER — ALUM & MAG HYDROXIDE-SIMETH 200-200-20 MG/5ML PO SUSP: 30.0000 mL | ORAL | Status: DC | PRN

## 2016-07-05 MED ORDER — MIDAZOLAM HCL 2 MG/2ML IJ SOLN
INTRAMUSCULAR | Status: AC
  Filled 2016-07-05: qty 2

## 2016-07-05 MED ORDER — INSULIN ASPART 100 UNIT/ML ~~LOC~~ SOLN
0.0000 [IU] | Freq: Three times a day (TID) | SUBCUTANEOUS | Status: DC
  Administered 2016-07-05: 2 [IU] via SUBCUTANEOUS

## 2016-07-05 MED ORDER — CEFAZOLIN SODIUM-DEXTROSE 2-4 GM/100ML-% IV SOLN
2.0000 g | Freq: Four times a day (QID) | INTRAVENOUS | Status: AC
  Administered 2016-07-05 – 2016-07-06 (×3): 2 g via INTRAVENOUS
  Filled 2016-07-05 (×3): qty 100

## 2016-07-05 MED ORDER — LACTATED RINGERS IV SOLN: INTRAVENOUS | Status: DC

## 2016-07-05 MED ORDER — OXYCODONE HCL 5 MG PO TABS
5.0000 mg | ORAL_TABLET | ORAL | Status: DC | PRN
  Administered 2016-07-05: 10 mg via ORAL
  Administered 2016-07-05: 5 mg via ORAL
  Administered 2016-07-06 (×2): 10 mg via ORAL
  Filled 2016-07-05: qty 1
  Filled 2016-07-05 (×3): qty 2

## 2016-07-05 MED ORDER — ACETAMINOPHEN 325 MG PO TABS: 650.0000 mg | ORAL_TABLET | Freq: Four times a day (QID) | ORAL | Status: DC | PRN

## 2016-07-05 MED ORDER — ALBUTEROL SULFATE HFA 108 (90 BASE) MCG/ACT IN AERS
INHALATION_SPRAY | RESPIRATORY_TRACT | Status: AC
  Filled 2016-07-05: qty 6.7

## 2016-07-05 MED ORDER — ROCURONIUM BROMIDE 10 MG/ML (PF) SYRINGE
PREFILLED_SYRINGE | INTRAVENOUS | Status: AC
  Filled 2016-07-05: qty 10

## 2016-07-05 MED ORDER — ALBUTEROL SULFATE HFA 108 (90 BASE) MCG/ACT IN AERS
INHALATION_SPRAY | RESPIRATORY_TRACT | Status: DC | PRN
  Administered 2016-07-05: 6 via RESPIRATORY_TRACT

## 2016-07-05 MED ORDER — MENTHOL 3 MG MT LOZG: 1.0000 | LOZENGE | OROMUCOSAL | Status: DC | PRN

## 2016-07-05 MED ORDER — SUGAMMADEX SODIUM 500 MG/5ML IV SOLN
INTRAVENOUS | Status: AC
  Filled 2016-07-05: qty 5

## 2016-07-05 MED ORDER — LIDOCAINE 2% (20 MG/ML) 5 ML SYRINGE
INTRAMUSCULAR | Status: DC | PRN
  Administered 2016-07-05: 20 mg via INTRAVENOUS

## 2016-07-05 MED ORDER — POLYETHYLENE GLYCOL 3350 17 G PO PACK: 17.0000 g | PACK | Freq: Every day | ORAL | Status: DC | PRN

## 2016-07-05 MED ORDER — PANTOPRAZOLE SODIUM 40 MG PO TBEC: 40.0000 mg | DELAYED_RELEASE_TABLET | Freq: Every day | ORAL | Status: DC

## 2016-07-05 MED ORDER — ROCURONIUM BROMIDE 10 MG/ML (PF) SYRINGE
PREFILLED_SYRINGE | INTRAVENOUS | Status: DC | PRN
  Administered 2016-07-05: 50 mg via INTRAVENOUS

## 2016-07-05 MED ORDER — CHLORHEXIDINE GLUCONATE 4 % EX LIQD: 60.0000 mL | Freq: Once | CUTANEOUS | Status: DC

## 2016-07-05 MED ORDER — SODIUM CHLORIDE 0.9 % IR SOLN
Status: DC | PRN
  Administered 2016-07-05: 1000 mL

## 2016-07-05 MED ORDER — PHENYLEPHRINE 40 MCG/ML (10ML) SYRINGE FOR IV PUSH (FOR BLOOD PRESSURE SUPPORT)
PREFILLED_SYRINGE | INTRAVENOUS | Status: DC | PRN
  Administered 2016-07-05: 40 ug via INTRAVENOUS
  Administered 2016-07-05 (×3): 80 ug via INTRAVENOUS

## 2016-07-05 MED ORDER — ONDANSETRON HCL 4 MG/2ML IJ SOLN: 4.0000 mg | Freq: Four times a day (QID) | INTRAMUSCULAR | Status: DC | PRN

## 2016-07-05 MED ORDER — DOCUSATE SODIUM 100 MG PO CAPS
100.0000 mg | ORAL_CAPSULE | Freq: Two times a day (BID) | ORAL | Status: DC
  Administered 2016-07-05: 100 mg via ORAL
  Filled 2016-07-05: qty 1

## 2016-07-05 SURGICAL SUPPLY — 71 items
ADH SKN CLS APL DERMABOND .7 (GAUZE/BANDAGES/DRESSINGS) ×1
AID PSTN UNV HD RSTRNT DISP (MISCELLANEOUS) ×1
BASEPLATE GLENOID SHLDR SM (Shoulder) ×1 IMPLANT
BLADE SAW SGTL 83.5X18.5 (BLADE) ×2 IMPLANT
BSPLAT GLND SM PRFT SHLDR CA (Shoulder) ×1 IMPLANT
COVER SURGICAL LIGHT HANDLE (MISCELLANEOUS) ×2 IMPLANT
CUP SUT UNIV REVERS 36+2 RT (Cup) ×1 IMPLANT
DERMABOND ADVANCED (GAUZE/BANDAGES/DRESSINGS) ×1
DERMABOND ADVANCED .7 DNX12 (GAUZE/BANDAGES/DRESSINGS) ×1 IMPLANT
DRAPE ORTHO SPLIT 77X108 STRL (DRAPES) ×4
DRAPE SURG 17X11 SM STRL (DRAPES) ×2 IMPLANT
DRAPE SURG ORHT 6 SPLT 77X108 (DRAPES) ×2 IMPLANT
DRAPE U-SHAPE 47X51 STRL (DRAPES) ×2 IMPLANT
DRESSING AQUACEL AG SP 3.5X6 (GAUZE/BANDAGES/DRESSINGS) IMPLANT
DRSG AQUACEL AG ADV 3.5X10 (GAUZE/BANDAGES/DRESSINGS) ×2 IMPLANT
DRSG AQUACEL AG SP 3.5X6 (GAUZE/BANDAGES/DRESSINGS) ×2
DURAPREP 26ML APPLICATOR (WOUND CARE) ×2 IMPLANT
ELECT BLADE 4.0 EZ CLEAN MEGAD (MISCELLANEOUS) ×2
ELECT CAUTERY BLADE 6.4 (BLADE) ×2 IMPLANT
ELECT REM PT RETURN 9FT ADLT (ELECTROSURGICAL) ×2
ELECTRODE BLDE 4.0 EZ CLN MEGD (MISCELLANEOUS) ×1 IMPLANT
ELECTRODE REM PT RTRN 9FT ADLT (ELECTROSURGICAL) ×1 IMPLANT
FACESHIELD WRAPAROUND (MASK) ×6 IMPLANT
FACESHIELD WRAPAROUND OR TEAM (MASK) ×3 IMPLANT
GLENOSPHERE LATERAL 36MM+4 (Shoulder) ×1 IMPLANT
GLOVE BIO SURGEON STRL SZ7.5 (GLOVE) ×2 IMPLANT
GLOVE BIO SURGEON STRL SZ8 (GLOVE) ×2 IMPLANT
GLOVE EUDERMIC 7 POWDERFREE (GLOVE) ×2 IMPLANT
GLOVE SS BIOGEL STRL SZ 7.5 (GLOVE) ×1 IMPLANT
GLOVE SUPERSENSE BIOGEL SZ 7.5 (GLOVE) ×1
GOWN STRL REUS W/ TWL LRG LVL3 (GOWN DISPOSABLE) IMPLANT
GOWN STRL REUS W/ TWL XL LVL3 (GOWN DISPOSABLE) ×2 IMPLANT
GOWN STRL REUS W/TWL LRG LVL3 (GOWN DISPOSABLE) ×2
GOWN STRL REUS W/TWL XL LVL3 (GOWN DISPOSABLE) ×6
INSERT HUMERAL 36 +6 (Shoulder) ×1 IMPLANT
KIT BASIN OR (CUSTOM PROCEDURE TRAY) ×2 IMPLANT
KIT ROOM TURNOVER OR (KITS) ×2 IMPLANT
MANIFOLD NEPTUNE II (INSTRUMENTS) ×2 IMPLANT
NDL HYPO 25GX1X1/2 BEV (NEEDLE) IMPLANT
NDL SUT .5 MAYO 1.404X.05X (NEEDLE) ×1 IMPLANT
NDL TAPERED W/ NITINOL LOOP (MISCELLANEOUS) IMPLANT
NEEDLE HYPO 25GX1X1/2 BEV (NEEDLE) IMPLANT
NEEDLE MAYO TAPER (NEEDLE)
NEEDLE TAPERED W/ NITINOL LOOP (MISCELLANEOUS) ×2 IMPLANT
NS IRRIG 1000ML POUR BTL (IV SOLUTION) ×2 IMPLANT
PACK SHOULDER (CUSTOM PROCEDURE TRAY) ×2 IMPLANT
PAD ARMBOARD 7.5X6 YLW CONV (MISCELLANEOUS) ×4 IMPLANT
PASSER SUT SWANSON 36MM LOOP (INSTRUMENTS) IMPLANT
RESTRAINT HEAD UNIVERSAL NS (MISCELLANEOUS) ×2 IMPLANT
SCREW CENTRAL NONLOCK 25MM (Screw) ×1 IMPLANT
SCREW LOCK GLENOID UNI PERI (Screw) ×1 IMPLANT
SCREW LOCK PERIPHERAL 30MM (Shoulder) ×1 IMPLANT
SET PIN UNIVERSAL REVERSE (SET/KITS/TRAYS/PACK) ×1 IMPLANT
SLING ARM FOAM STRAP LRG (SOFTGOODS) IMPLANT
SPONGE LAP 18X18 X RAY DECT (DISPOSABLE) ×2 IMPLANT
SPONGE LAP 4X18 X RAY DECT (DISPOSABLE) ×2 IMPLANT
STEM HUMERAL UNI REVERS SZ9 (Stem) ×1 IMPLANT
SUCTION FRAZIER HANDLE 10FR (MISCELLANEOUS) ×1
SUCTION TUBE FRAZIER 10FR DISP (MISCELLANEOUS) ×1 IMPLANT
SUT BONE WAX W31G (SUTURE) IMPLANT
SUT FIBERWIRE #2 38 T-5 BLUE (SUTURE) ×6
SUT MNCRL AB 3-0 PS2 18 (SUTURE) ×2 IMPLANT
SUT MON AB 2-0 CT1 36 (SUTURE) ×2 IMPLANT
SUT VIC AB 1 CT1 27 (SUTURE) ×2
SUT VIC AB 1 CT1 27XBRD ANBCTR (SUTURE) ×1 IMPLANT
SUT VIC AB 2-0 CT1 27 (SUTURE)
SUT VIC AB 2-0 CT1 TAPERPNT 27 (SUTURE) IMPLANT
SUTURE FIBERWR #2 38 T-5 BLUE (SUTURE) ×2 IMPLANT
SYR CONTROL 10ML LL (SYRINGE) IMPLANT
TOWEL OR 17X24 6PK STRL BLUE (TOWEL DISPOSABLE) ×2 IMPLANT
TOWEL OR 17X26 10 PK STRL BLUE (TOWEL DISPOSABLE) ×2 IMPLANT

## 2016-07-05 NOTE — Anesthesia Procedure Notes (Signed)
Anesthesia Regional Block:  Interscalene brachial plexus block  Pre-Anesthetic Checklist: ,, timeout performed, Correct Patient, Correct Site, Correct Laterality, Correct Procedure, Correct Position, site marked, Risks and benefits discussed,  Surgical consent,  Pre-op evaluation,  At surgeon's request and post-op pain management  Laterality: Right  Prep: chloraprep       Needles:  Injection technique: Single-shot  Needle Type: Echogenic Stimulator Needle     Needle Length: 9cm 9 cm Needle Gauge: 21 and 21 G    Additional Needles:  Procedures: ultrasound guided (picture in chart) and nerve stimulator Interscalene brachial plexus block  Nerve Stimulator or Paresthesia:  Response: deltoid, 0.5 mA,   Additional Responses:   Narrative:  Start time: 07/05/2016 12:12 PM End time: 07/05/2016 12:19 PM Injection made incrementally with aspirations every 5 mL.  Performed by: Personally  Anesthesiologist: Suzette Battiest

## 2016-07-05 NOTE — H&P (Signed)
Gabriella [name]    Chief Complaint: RIGHT SHOULDER ROTATOR CUFF ARTHROPATHY HPI: The patient is a 60 y.o. 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 Cancer (HCC)    FACIAL CANCER   . Dependent edema   . Diabetes mellitus   . Headache   . Hypertension   . Hypertriglyceridemia   . Hypokalemia   . Metrorrhagia   . Obesity   . Peptic ulcer disease   . PONV (postoperative nausea and vomiting)   . SUI (stress urinary incontinence, female)   . Tobacco abuse     Past Surgical History:  Procedure Laterality Date  . ANTERIOR CRUCIATE LIGAMENT REPAIR     BILATERAL  92+95  . arthroscopic knee surgery  11/04   right knee  . CYST EXCISION     OVARY     . perforated duodenal ulcer  04/08  . RADIOACTIVE SEED IMPLANT      Family History  Problem Relation Age of Onset  . Hypertension Mother   . Cancer Father     Social History:  reports that she has been smoking Cigarettes.  She has been smoking about 1.00 pack per day. She does not have any smokeless tobacco history on file. She reports that she does not drink alcohol or use drugs.   Medications Prior to Admission  Medication Sig Dispense Refill  . allopurinol (ZYLOPRIM) 300 MG tablet TAKE 1 TABLET (300 MG TOTAL) BY MOUTH DAILY. 90 tablet 1  . amLODipine (NORVASC) 10 MG tablet TAKE 1 TABLET (10 MG TOTAL) BY MOUTH DAILY. 90 tablet 1  . Cholecalciferol (HM VITAMIN D3) 4000 UNITS CAPS Take 4,000 Units by mouth daily.    . furosemide (LASIX) 40 MG tablet TAKE 1 TABLET BY MOUTH DAILY 90 tablet 1  . HYDROcodone-acetaminophen (NORCO) 10-325 MG tablet Take 1 tablet by mouth every 8 (eight) hours as needed for moderate pain. 90 tablet 0  . irbesartan (AVAPRO) 300 MG tablet TAKE 1 TABLET (300 MG TOTAL) BY MOUTH DAILY. 30 tablet 2  . KLOR-CON M20 20 MEQ tablet TAKE 1 TABLET (20 MEQ TOTAL) BY MOUTH 3 (THREE) TIMES DAILY. 180 tablet 5  . metoprolol succinate (TOPROL-XL) 50 MG 24 hr  tablet TAKE 1 TABLET (50 MG TOTAL) BY MOUTH DAILY. TAKE WITH OR IMMEDIATELY FOLLOWING A MEAL. (Patient taking differently: Take 50 mg by mouth daily. TAKE WITH OR IMMEDIATELY FOLLOWING A MEAL.) 90 tablet 3  . Omega-3 Fatty Acids (FISH OIL) 1000 MG CAPS Take 1,000 mg by mouth daily. WILL STOP PRIOR TO PROCEDURE    . ONE TOUCH ULTRA TEST test strip TEST 2 TIMES A DAY 300 each 3  . pantoprazole (PROTONIX) 40 MG tablet Take 1 tablet (40 mg total) by mouth daily. 90 tablet 3  . simvastatin (ZOCOR) 20 MG tablet TAKE 1 TABLET (20 MG TOTAL) BY MOUTH AT BEDTIME. 90 tablet 1  . dicyclomine (BENTYL) 20 MG tablet Take 1 tablet (20 mg total) by mouth 3 (three) times daily before meals. (Patient taking differently: Take 20 mg by mouth 3 (three) times daily as needed for spasms. BEFORE MEALS) 90 tablet 2  . lisinopril (PRINIVIL,ZESTRIL) 20 MG tablet Take 1 tablet (20 mg total) by mouth daily. (Patient not taking: Reported on 05/17/2016) 90 tablet 3     Physical Exam: right shoulder with painful and restricted motion as noted at recent office visits  Vitals  Temp:  [98.6 F (37 C)] 98.6 F (  37 C) (12/07 1058) Resp:  [18] 18 (12/07 1058) BP: (155)/(82) 155/82 (12/07 1058) SpO2:  [97 %] 97 % (12/07 1058) Weight:  [118.4 kg (261 lb)] 118.4 kg (261 lb) (12/07 1058)  Assessment/Plan  Impression: RIGHT SHOULDER ROTATOR CUFF ARTHROPATHY  Plan of Action: Procedure(s): REVERSE SHOULDER ARTHROPLASTY  Venice Liz M Panzy Bubeck 07/05/2016, 12:13 PM Contact # [phone]

## 2016-07-05 NOTE — Anesthesia Preprocedure Evaluation (Addendum)
Anesthesia Evaluation  Patient identified by MRN, date of birth, ID band Patient awake    Reviewed: Allergy & Precautions, NPO status , Patient's Chart, lab work & pertinent test results, reviewed documented beta blocker date and time   History of Anesthesia Complications (+) PONV and history of anesthetic complications  Airway Mallampati: II  TM Distance: >3 FB Neck ROM: Full    Dental  (+) Dental Advisory Given,    Pulmonary Current Smoker,    breath sounds clear to auscultation       Cardiovascular hypertension, Pt. on medications and Pt. on home beta blockers (-) angina Rhythm:Regular Rate:Normal     Neuro/Psych  Headaches,    GI/Hepatic PUD,   Endo/Other  diabetes, Type 2, Oral Hypoglycemic AgentsMorbid obesity  Renal/GU      Musculoskeletal   Abdominal   Peds  Hematology   Anesthesia Other Findings   Reproductive/Obstetrics                           Lab Results  Component Value Date   WBC 7.5 06/28/2016   HGB 16.6 (H) 06/28/2016   HCT 47.8 (H) 06/28/2016   MCV 98.4 06/28/2016   PLT 235 06/28/2016   Lab Results  Component Value Date   CREATININE 0.64 06/28/2016   BUN 16 06/28/2016   NA 139 06/28/2016   K 4.1 06/28/2016   CL 107 06/28/2016   CO2 22 06/28/2016    Anesthesia Physical Anesthesia Plan  ASA: III  Anesthesia Plan: General   Post-op Pain Management: GA combined w/ Regional for post-op pain   Induction: Intravenous  Airway Management Planned: Oral ETT  Additional Equipment:   Intra-op Plan:   Post-operative Plan: Extubation in OR  Informed Consent: I have reviewed the patients History and Physical, chart, labs and discussed the procedure including the risks, benefits and alternatives for the proposed anesthesia with the patient or authorized representative who has indicated his/her understanding and acceptance.   Dental advisory given  Plan  Discussed with: CRNA, Anesthesiologist and Surgeon  Anesthesia Plan Comments:         Anesthesia Quick Evaluation

## 2016-07-05 NOTE — Anesthesia Procedure Notes (Signed)
Procedure Name: Intubation Date/Time: 07/05/2016 1:10 PM Performed by: Garrison Columbus T Pre-anesthesia Checklist: Patient identified, Emergency Drugs available, Suction available and Patient being monitored Patient Re-evaluated:Patient Re-evaluated prior to inductionOxygen Delivery Method: Circle System Utilized Preoxygenation: Pre-oxygenation with 100% oxygen Intubation Type: IV induction Ventilation: Mask ventilation without difficulty and Oral airway inserted - appropriate to patient size Laryngoscope Size: Sabra Heck and 2 Grade View: Grade I Tube type: Oral Tube size: 7.5 mm Number of attempts: 1 Airway Equipment and Method: Stylet and Oral airway Placement Confirmation: ETT inserted through vocal cords under direct vision,  positive ETCO2 and breath sounds checked- equal and bilateral Secured at: 23 cm Tube secured with: Tape Dental Injury: Teeth and Oropharynx as per pre-operative assessment

## 2016-07-05 NOTE — Op Note (Signed)
07/05/2016  2:36 PM  PATIENT:   Gabriella [name]  60 y.o. female  PRE-OPERATIVE DIAGNOSIS:  RIGHT SHOULDER ROTATOR CUFF TEAR  ARTHROPATHY  POST-OPERATIVE DIAGNOSIS:  same  PROCEDURE:  R reverse shoulder arthroplasty # 6 stem, +6 POLY, 36/+4 GLENOSPHERE  SURGEON:  Orland Visconti, Metta Clines M.D.  ASSISTANTS: Shuford pac   ANESTHESIA:   GET + ISB  EBL: 200  SPECIMEN:  none  Drains: none   PATIENT DISPOSITION:  PACU - hemodynamically stable.    PLAN OF CARE: Admit for overnight observation  Dictation# [id number]   Contact # [phone]

## 2016-07-05 NOTE — Anesthesia Postprocedure Evaluation (Signed)
Anesthesia Post Note  Patient: [name] Leard  Procedure(s) Performed: Procedure(s) (LRB): REVERSE SHOULDER ARTHROPLASTY (Right)  Patient location during evaluation: PACU Anesthesia Type: General and Regional Level of consciousness: awake and alert Pain management: pain level controlled Vital Signs Assessment: post-procedure vital signs reviewed and stable Respiratory status: spontaneous breathing, nonlabored ventilation, respiratory function stable and patient connected to nasal cannula oxygen Cardiovascular status: blood pressure returned to baseline and stable Postop Assessment: no signs of nausea or vomiting Anesthetic complications: no    Last Vitals:  Vitals:   07/05/16 1604 07/05/16 1617  BP: 104/64 119/83  Pulse: 65 (!) 59  Resp: 14 16  Temp: 36.1 C 36.9 C    Last Pain:  Vitals:   07/05/16 1604  TempSrc:   PainSc: 0-No pain                 Tiajuana Amass

## 2016-07-05 NOTE — Transfer of Care (Signed)
Immediate Anesthesia Transfer of Care Note  Patient: Gabriella [name]  Procedure(s) Performed: Procedure(s): REVERSE SHOULDER ARTHROPLASTY (Right)  Patient Location: PACU  Anesthesia Type:General  Level of Consciousness: awake, alert  and oriented  Airway & Oxygen Therapy: Patient Spontanous Breathing and Patient connected to face mask oxygen  Post-op Assessment: Report given to RN, Post -op Vital signs reviewed and stable and Patient moving all extremities  Post vital signs: Reviewed and stable  Last Vitals:  Vitals:   07/05/16 1058  BP: (!) 155/82  Resp: 18  Temp: 37 C    Last Pain:  Vitals:   07/05/16 1058  TempSrc: Oral      Patients Stated Pain Goal: 3 ([id number])  Complications: No apparent anesthesia complications

## 2016-07-05 NOTE — Discharge Instructions (Signed)

## 2016-07-06 DIAGNOSIS — Z85828 Personal history of other malignant neoplasm of skin: Secondary | ICD-10-CM | POA: Diagnosis not present

## 2016-07-06 DIAGNOSIS — Z6841 Body Mass Index (BMI) 40.0 and over, adult: Secondary | ICD-10-CM | POA: Diagnosis not present

## 2016-07-06 DIAGNOSIS — N393 Stress incontinence (female) (male): Secondary | ICD-10-CM | POA: Diagnosis not present

## 2016-07-06 DIAGNOSIS — E119 Type 2 diabetes mellitus without complications: Secondary | ICD-10-CM | POA: Diagnosis not present

## 2016-07-06 DIAGNOSIS — M12811 Other specific arthropathies, not elsewhere classified, right shoulder: Secondary | ICD-10-CM | POA: Diagnosis not present

## 2016-07-06 DIAGNOSIS — F17210 Nicotine dependence, cigarettes, uncomplicated: Secondary | ICD-10-CM | POA: Diagnosis not present

## 2016-07-06 DIAGNOSIS — Z7984 Long term (current) use of oral hypoglycemic drugs: Secondary | ICD-10-CM | POA: Diagnosis not present

## 2016-07-06 DIAGNOSIS — K279 Peptic ulcer, site unspecified, unspecified as acute or chronic, without hemorrhage or perforation: Secondary | ICD-10-CM | POA: Diagnosis not present

## 2016-07-06 DIAGNOSIS — E781 Pure hyperglyceridemia: Secondary | ICD-10-CM | POA: Diagnosis not present

## 2016-07-06 DIAGNOSIS — I10 Essential (primary) hypertension: Secondary | ICD-10-CM | POA: Diagnosis not present

## 2016-07-06 LAB — GLUCOSE, CAPILLARY: Glucose-Capillary: 107 mg/dL — ABNORMAL HIGH (ref 65–99)

## 2016-07-06 MED ORDER — DIAZEPAM 5 MG PO TABS: 2.5000 mg | ORAL_TABLET | Freq: Four times a day (QID) | ORAL | 1 refills | Status: DC | PRN

## 2016-07-06 MED ORDER — OXYCODONE-ACETAMINOPHEN 5-325 MG PO TABS: 1.0000 | ORAL_TABLET | ORAL | 0 refills | Status: DC | PRN

## 2016-07-06 MED ORDER — ONDANSETRON HCL 4 MG PO TABS: 4.0000 mg | ORAL_TABLET | Freq: Three times a day (TID) | ORAL | 0 refills | Status: DC | PRN

## 2016-07-06 NOTE — Progress Notes (Signed)
OT NOTE- LATE GCODE ENTRY    07/06/16 0900  OT G-codes **NOT FOR INPATIENT CLASS**  Functional Assessment Tool Used clinical judgement  Functional Limitation Self care  Self Care Current Status [id number]) CJ  Self Care Goal Status [id number]) CI  Self Care Discharge Status [id number]) Elsie Stain   OTR/L Pager: [phone] Office: [phone] .

## 2016-07-06 NOTE — Op Note (Signed)
NAME:  Gabriella [name], Gabriella [name]                      ACCOUNT NO.:  MEDICAL RECORD NO.:  [id number]  LOCATION:                                 FACILITY:  PHYSICIAN:  Metta Clines. Keon Pender, M.D.  DATE OF BIRTH:  [birth year]  DATE OF PROCEDURE:  07/05/2016 DATE OF DISCHARGE:                              OPERATIVE REPORT   PREOPERATIVE DIAGNOSIS:  End-stage right shoulder rotator cuff tear arthropathy.  POSTOPERATIVE DIAGNOSIS:  End-stage right shoulder rotator cuff tear arthropathy.  PROCEDURE:  Right reverse shoulder arthroplasty utilizing a press-fit size 6 stem, +6 polyethylene insert, a 36+ 4 glenosphere on a small baseplate.  SURGEON:  Metta Clines. Sebastin Perlmutter, M.D.  Terrence DupontOlivia Mackie A. Shuford, P.A.-C.  ANESTHESIA:  General endotracheal as well as interscalene block.  ESTIMATED BLOOD LOSS:  Minimal.  DRAINS:  None.  HISTORY:  Gabriella [name] is a 60 year old female, who has had chronic and progressively increasing right shoulder pain related to a rotator cuff tear arthropathy.  Due to her increasing pain and functional limitations, she is brought to the operating room at this time for planned right shoulder reverse arthroplasty as described below.  Preoperatively, I counseled Gabriella [name] regarding treatment options and potential risks versus benefits thereof.  Possible surgical complications were all reviewed including bleeding, infection, neurovascular injury, persistent pain, loss of motion, anesthetic complication, failure of the implant and possible need for additional surgery.  She understands and accepts, and agrees with our planned procedure.  PROCEDURE IN DETAIL:  After undergoing routine preop evaluation, the patient received prophylactic antibiotics.  Interscalene block was established in the holding area by the Anesthesia Department.  Placed supine on the operating table, underwent smooth induction of general endotracheal anesthesia.  Placed in the beach-chair position and appropriately  padded and protected.  The right shoulder girdle region was then sterilely prepped and draped in standard fashion.  Time-out was called.  An anterior deltopectoral approach, through an 8-cm incision was made over the right shoulder, dissection carried deeply, electrocautery was used for hemostasis.  The deltopectoral interval was then developed with the vein taken laterally and the upper centimeter of the pec major was tenotomized to enhance exposure.  Adhesion was divided beneath the deltoid, the conjoined tendon mobilized and retracted medially.  We then tenotomized the long head biceps tendon and separated the subscap from the lesser tuberosity and tagging the margin with #2 grasping FiberWire sutures.  We then divided the capsular attachments on the anterior-inferior and inferior aspects of the humeral neck allowing delivery of the humeral head through the wound.  We then outlined our proposed humeral head resection, approximately 20 degrees of retroversion at 135-degree angle and the humeral head was then resected using an oscillating saw, and a metal cap was then placed over the cut surface of the proximal humerus for protection and at this point, we then exposed the glenoid with combination of Fukuda, pitchfork and snake tongue retractors.  I performed a circumferential labral excision, gained complete visualization of the glenoid, placed a guidepin in the center of the glenoid.  We then reamed the glenoid to fit for a small baseplate and after establishing appropriate bony  bed, impacted our small baseplate, placed our central lag screw, and these inferior and superior locking screws, all of which, obtained excellent bony purchase and fixation.  We then placed a 36 +4 glenosphere on the baseplate. This was impacted and showing good stability.  At this point, we then returned our attention to the proximal humerus where hand reamed the canal up to 7, broached up to size 6, which  showed excellent fit and fixation.  We then prepared the proximal humeral metaphyseal region with the +2 posterior offset.  We then performed the trial reduction of trial implant and this showed good soft tissue balance.  At this point on the back table, we then assembled our size 6 stem with the +2 offset metaphyseal segment and this was then impacted into position.  We then performed a series of trial reductions and the +6 polyethylene insert showed excellent soft tissue balance with good stability.  The final +6 poly was impacted into the humeral stem.  The final reduction was then performed.  Excellent soft tissue balance and good stability were achieved.  The wound was then copiously irrigated, hemostasis was obtained.  We then repaired the subscapularis back to the humeral neck through the eyelets on the metaphysis of the implanted stem.  We then reapproximated the deltopectoral interval with figure-of-eight #1 Vicryl sutures.  2-0 Vicryl was used for subcu layer, intracuticular 3-0 Monocryl for the skin followed by Dermabond and an Aquacel dressing. Right arm was then placed into a sling.  The patient was awakened, extubated and taken to the recovery room in stable condition.  [name], PA-C, was used as an Environmental consultant throughout this case, was essential for help with positioning the patient, positioning the extremity, management of the tissue retractors, implantation of prosthesis, wound closure and intraoperative decision making.     Metta Clines. Olvin Rohr, M.D.     KMS/MEDQ  D:  07/05/2016  T:  07/06/2016  Job:  [id number]

## 2016-07-06 NOTE — Evaluation (Signed)
Occupational Therapy Evaluation Patient Details Name: Gabriella [name] MRN: [id number] DOB: [birth year] Today's Date: 07/06/2016    History of Present Illness 60 yo female s/p r REVERSE SHOULDER ARTHROPLASTY.  Past Medical History:  Diagnosis Date  . Cancer (HCC)    FACIAL CANCER   . Dependent edema   . Diabetes mellitus   . Headache   . Hypertension   . Hypertriglyceridemia   . Hypokalemia   . Metrorrhagia   . Obesity   . Peptic ulcer disease   . PONV (postoperative nausea and vomiting)   . SUI (stress urinary incontinence, female)   . Tobacco abuse       Clinical Impression   Patient evaluated by Occupational Therapy with no further acute OT needs identified. All education has been completed and the patient has no further questions. See below for any follow-up Occupational Therapy or equipment needs. OT to sign off. Thank you for referral.      Follow Up Recommendations  No OT follow up    Equipment Recommendations  None recommended by OT    Recommendations for Other Services       Precautions / Restrictions Precautions Precautions: Shoulder Type of Shoulder Precautions: passive with no External rotation allowed Shoulder Interventions: Shoulder sling/immobilizer;At all times;Off for dressing/bathing/exercises Precaution Comments: handout provided for wrist hand and elbow, FF 90 degrees supine and abduction 60 degrees Required Braces or Orthoses: Spinal Brace      Mobility Bed Mobility Overal bed mobility: Needs Assistance Bed Mobility: Supine to Sit;Sit to Supine     Supine to sit: Min assist Sit to supine: Min guard   General bed mobility comments: spouse helping exit the bed  Transfers Overall transfer level: Modified independent                    Balance                                            ADL Overall ADL's : Needs assistance/impaired Eating/Feeding: Modified independent       Upper Body Bathing: Minimal  assistance Upper Body Bathing Details (indicate cue type and reason): spouse educated on washing with wash cloth  Lower Body Bathing: Minimal assistance   Upper Body Dressing : Minimal assistance Upper Body Dressing Details (indicate cue type and reason): don shirt during session and sling Lower Body Dressing: Minimal assistance Lower Body Dressing Details (indicate cue type and reason): educated on don of pants and underwear at one time Toilet Transfer: Supervision/safety           Functional mobility during ADLs: Supervision/safety General ADL Comments: Educated with shoulder with handouts     Vision     Perception     Praxis      Pertinent Vitals/Pain Pain Assessment: Faces Faces Pain Scale: Hurts a little bit Pain Location: R shoulder Pain Descriptors / Indicators: Sore Pain Intervention(s): Premedicated before session;Repositioned     Hand Dominance Right   Extremity/Trunk Assessment Upper Extremity Assessment Upper Extremity Assessment: RUE deficits/detail RUE Deficits / Details: s/p surg   Lower Extremity Assessment Lower Extremity Assessment: Overall WFL for tasks assessed   Cervical / Trunk Assessment Cervical / Trunk Assessment: Normal   Communication Communication Communication: No difficulties   Cognition Arousal/Alertness: Awake/alert Behavior During Therapy: WFL for tasks assessed/performed Overall Cognitive Status: Within Functional Limits for tasks assessed  General Comments       Exercises Exercises: Shoulder     Shoulder Instructions Shoulder Instructions Donning/doffing shirt without moving shoulder: Minimal assistance Method for sponge bathing under operated UE: Minimal assistance Donning/doffing sling/immobilizer: Minimal assistance Correct positioning of sling/immobilizer: Supervision/safety ROM for elbow, wrist and digits of operated UE: Modified independent Positioning of UE while sleeping: Minimal  assistance    Home Living Family/patient expects to be discharged to:: Private residence Living Arrangements: Spouse/significant other Available Help at Discharge: Family Type of Home: House                 Bathroom Toilet: Standard     Home Equipment: None          Prior Functioning/Environment Level of Independence: Independent                 OT Problem List:     OT Treatment/Interventions:      OT Goals(Current goals can be found in the care plan section)    OT Frequency:     Barriers to D/C:            Co-evaluation              End of Session Nurse Communication: Mobility status;Precautions  Activity Tolerance: Patient tolerated treatment well Patient left: in bed;with call bell/phone within reach;with family/visitor present   Time: 0826-0851 OT Time Calculation (min): 25 min Charges:  OT General Charges $OT Visit: 1 Procedure OT Evaluation $OT Eval Moderate Complexity: 1 Procedure G-Codes:    Parke Poisson B 07-25-16, 9:27 AM   Jeri Modena   OTR/L PagerIP:[phone] Office: [phone] .

## 2016-07-06 NOTE — Discharge Summary (Signed)
PATIENT ID:      Gabriella [name]  MRN:     [id number] DOB/AGE:    12/17/1955 / 60 y.o.     DISCHARGE SUMMARY  ADMISSION DATE:    07/05/2016 DISCHARGE DATE:    ADMISSION DIAGNOSIS: RIGHT SHOULDER ROTATOR CUFF ARTHROPATHY Past Medical History:  Diagnosis Date  . Cancer (HCC)    FACIAL CANCER   . Dependent edema   . Diabetes mellitus   . Headache   . Hypertension   . Hypertriglyceridemia   . Hypokalemia   . Metrorrhagia   . Obesity   . Peptic ulcer disease   . PONV (postoperative nausea and vomiting)   . SUI (stress urinary incontinence, female)   . Tobacco abuse     DISCHARGE DIAGNOSIS:   Active Problems:   S/P reverse total shoulder arthroplasty, right   PROCEDURE: Procedure(s): REVERSE SHOULDER ARTHROPLASTY on 07/05/2016  CONSULTS:    HISTORY:  See H&P in chart.  HOSPITAL COURSE:  Gabriella [name] is a 60 y.o. admitted on 07/05/2016 with a diagnosis of RIGHT SHOULDER ROTATOR CUFF ARTHROPATHY.  They were brought to the operating room on 07/05/2016 and underwent Procedure(s): [place].    They were given perioperative antibiotics: Anti-infectives    Start     Dose/Rate Route Frequency Ordered Stop   07/05/16 1700  ceFAZolin (ANCEF) IVPB 2g/100 mL premix     2 g 200 mL/hr over 30 Minutes Intravenous Every 6 hours 07/05/16 1627 07/06/16 0344   07/05/16 1200  ceFAZolin (ANCEF) 3 g in dextrose 5 % 50 mL IVPB     3 g 130 mL/hr over 30 Minutes Intravenous To [facility] 07/05/16 0646 07/05/16 1325    .  Patient underwent the above named procedure and tolerated it well. The following day they were hemodynamically stable and pain was controlled on oral analgesics. They were neurovascularly intact to the operative extremity. OT was ordered and worked with patient per protocol. They were medically and orthopaedically stable for discharge on day1 .    DIAGNOSTIC STUDIES:  RECENT RADIOGRAPHIC STUDIES :  No results found.  RECENT VITAL SIGNS:  Patient Vitals  for the past 24 hrs:  BP Temp Temp src Pulse Resp SpO2 Height Weight  07/06/16 0734 (!) 153/53 99.2 F (37.3 C) - 78 18 93 % - -  07/06/16 0330 122/62 - - 74 18 94 % - -  07/05/16 2245 120/64 98.4 F (36.9 C) Oral 71 18 94 % - -  07/05/16 2041 123/75 98.4 F (36.9 C) Oral 70 20 93 % - -  07/05/16 1617 119/83 98.4 F (36.9 C) - (!) 59 16 93 % - -  07/05/16 1604 104/64 97 F (36.1 C) - 65 14 93 % - -  07/05/16 1538 112/61 - - 68 12 94 % - -  07/05/16 1526 - - - 63 12 94 % - -  07/05/16 1515 - - - 66 18 95 % - -  07/05/16 1509 - 97.8 F (36.6 C) - 65 18 92 % - -  07/05/16 1058 (!) 155/82 98.6 F (37 C) Oral - 18 97 % 5\' 5"  (1.651 m) 118.4 kg (261 lb)  .  RECENT EKG RESULTS:    Orders placed or performed during the hospital encounter of 07/05/16  . EKG 12-Lead  . EKG 12-Lead    DISCHARGE INSTRUCTIONS:    DISCHARGE MEDICATIONS:     Medication List    TAKE these medications   allopurinol 300 MG tablet Commonly  known as:  ZYLOPRIM TAKE 1 TABLET (300 MG TOTAL) BY MOUTH DAILY.   amLODipine 10 MG tablet Commonly known as:  NORVASC TAKE 1 TABLET (10 MG TOTAL) BY MOUTH DAILY.   diazepam 5 MG tablet Commonly known as:  VALIUM Take 0.5-1 tablets (2.5-5 mg total) by mouth every 6 (six) hours as needed for muscle spasms or sedation.   dicyclomine 20 MG tablet Commonly known as:  BENTYL Take 1 tablet (20 mg total) by mouth 3 (three) times daily before meals. What changed:  when to take this  reasons to take this  additional instructions   Fish Oil 1000 MG Caps Take 1,000 mg by mouth daily. WILL STOP PRIOR TO PROCEDURE   furosemide 40 MG tablet Commonly known as:  LASIX TAKE 1 TABLET BY MOUTH DAILY   HM VITAMIN D3 4000 units Caps Generic drug:  Cholecalciferol Take 4,000 Units by mouth daily.   HYDROcodone-acetaminophen 10-325 MG tablet Commonly known as:  NORCO Take 1 tablet by mouth every 8 (eight) hours as needed for moderate pain.   irbesartan 300 MG  tablet Commonly known as:  AVAPRO TAKE 1 TABLET (300 MG TOTAL) BY MOUTH DAILY.   KLOR-CON M20 20 MEQ tablet Generic drug:  potassium chloride SA TAKE 1 TABLET (20 MEQ TOTAL) BY MOUTH 3 (THREE) TIMES DAILY.   lisinopril 20 MG tablet Commonly known as:  PRINIVIL,ZESTRIL Take 1 tablet (20 mg total) by mouth daily.   metoprolol succinate 50 MG 24 hr tablet Commonly known as:  TOPROL-XL TAKE 1 TABLET (50 MG TOTAL) BY MOUTH DAILY. TAKE WITH OR IMMEDIATELY FOLLOWING A MEAL. What changed:  how much to take  how to take this  when to take this  additional instructions   ONE TOUCH ULTRA TEST test strip Generic drug:  glucose blood TEST 2 TIMES A DAY   oxyCODONE-acetaminophen 5-325 MG tablet Commonly known as:  PERCOCET Take 1-2 tablets by mouth every 4 (four) hours as needed.   pantoprazole 40 MG tablet Commonly known as:  PROTONIX Take 1 tablet (40 mg total) by mouth daily.   simvastatin 20 MG tablet Commonly known as:  ZOCOR TAKE 1 TABLET (20 MG TOTAL) BY MOUTH AT BEDTIME.       FOLLOW UP VISIT:   Follow-up Information    Metta Clines SUPPLE, MD.   Specialty:  Orthopedic Surgery Why:  call to be seen in 10-14 days Contact information: [address] [place] [address] [place]: good  DISCHARGE CONDITION:  Festus Barren for Dr. Justice Britain 07/06/2016, 7:57 AM

## 2016-07-06 NOTE — Progress Notes (Signed)
Patient alert and oriented, mae's well, voiding adequate amount of urine, swallowing without difficulty, no c/o pain at time of discharge. Patient discharged home with family. Script and discharged instructions given to patient. Patient and family stated understanding of instructions given. Patient has an appointment with Dr. Brooks  

## 2016-07-10 ENCOUNTER — Other Ambulatory Visit: Payer: Self-pay | Admitting: Internal Medicine

## 2016-07-10 ENCOUNTER — Encounter (HOSPITAL_COMMUNITY): Payer: Self-pay | Admitting: Orthopedic Surgery

## 2016-07-18 ENCOUNTER — Other Ambulatory Visit: Payer: Self-pay | Admitting: Internal Medicine

## 2016-07-18 MED ORDER — GLUCOSE BLOOD VI STRP: ORAL_STRIP | 12 refills | Status: DC

## 2016-07-18 NOTE — Telephone Encounter (Signed)
Patient called requesting contour next test strip.  Rx sent to pharmacy.  Patient aware.

## 2016-07-19 DIAGNOSIS — Z96611 Presence of right artificial shoulder joint: Secondary | ICD-10-CM | POA: Diagnosis not present

## 2016-07-27 ENCOUNTER — Other Ambulatory Visit: Payer: Self-pay | Admitting: Internal Medicine

## 2016-07-27 MED ORDER — HYDROCODONE-ACETAMINOPHEN 10-325 MG PO TABS: 1.0000 | ORAL_TABLET | Freq: Three times a day (TID) | ORAL | 0 refills | Status: DC | PRN

## 2016-07-27 NOTE — Telephone Encounter (Signed)
Patient request hydrocodone refill.  Last filled 06/28/2016.  Please advise.  Hydrocodone printed.  Per Dr Renold Genta this is the last rx we will write patient needs pain management.  Patient aware.

## 2016-08-09 DIAGNOSIS — M25511 Pain in right shoulder: Secondary | ICD-10-CM | POA: Diagnosis not present

## 2016-08-09 DIAGNOSIS — Z96611 Presence of right artificial shoulder joint: Secondary | ICD-10-CM | POA: Diagnosis not present

## 2016-08-22 ENCOUNTER — Other Ambulatory Visit: Payer: Self-pay | Admitting: Internal Medicine

## 2016-08-24 DIAGNOSIS — Z96611 Presence of right artificial shoulder joint: Secondary | ICD-10-CM | POA: Diagnosis not present

## 2016-08-24 DIAGNOSIS — M25511 Pain in right shoulder: Secondary | ICD-10-CM | POA: Diagnosis not present

## 2016-08-31 DIAGNOSIS — M25511 Pain in right shoulder: Secondary | ICD-10-CM | POA: Diagnosis not present

## 2016-08-31 DIAGNOSIS — Z96611 Presence of right artificial shoulder joint: Secondary | ICD-10-CM | POA: Diagnosis not present

## 2016-09-05 ENCOUNTER — Other Ambulatory Visit: Payer: Self-pay | Admitting: Internal Medicine

## 2016-09-05 DIAGNOSIS — Z1231 Encounter for screening mammogram for malignant neoplasm of breast: Secondary | ICD-10-CM

## 2016-09-07 DIAGNOSIS — Z96611 Presence of right artificial shoulder joint: Secondary | ICD-10-CM | POA: Diagnosis not present

## 2016-09-07 DIAGNOSIS — M25511 Pain in right shoulder: Secondary | ICD-10-CM | POA: Diagnosis not present

## 2016-09-14 DIAGNOSIS — Z96611 Presence of right artificial shoulder joint: Secondary | ICD-10-CM | POA: Diagnosis not present

## 2016-09-14 DIAGNOSIS — M25511 Pain in right shoulder: Secondary | ICD-10-CM | POA: Diagnosis not present

## 2016-09-17 ENCOUNTER — Ambulatory Visit
Admission: RE | Admit: 2016-09-17 | Discharge: 2016-09-17 | Disposition: A | Discharge status: Home / Self Care | Payer: BLUE CROSS/BLUE SHIELD | Source: Ambulatory Visit | Attending: Internal Medicine | Admitting: Internal Medicine

## 2016-09-17 DIAGNOSIS — Z1231 Encounter for screening mammogram for malignant neoplasm of breast: Secondary | ICD-10-CM

## 2016-09-21 DIAGNOSIS — M25511 Pain in right shoulder: Secondary | ICD-10-CM | POA: Diagnosis not present

## 2016-09-21 DIAGNOSIS — Z96611 Presence of right artificial shoulder joint: Secondary | ICD-10-CM | POA: Diagnosis not present

## 2016-09-28 DIAGNOSIS — Z96611 Presence of right artificial shoulder joint: Secondary | ICD-10-CM | POA: Diagnosis not present

## 2016-09-28 DIAGNOSIS — M25511 Pain in right shoulder: Secondary | ICD-10-CM | POA: Diagnosis not present

## 2016-10-03 DIAGNOSIS — Z471 Aftercare following joint replacement surgery: Secondary | ICD-10-CM | POA: Diagnosis not present

## 2016-10-03 DIAGNOSIS — Z96611 Presence of right artificial shoulder joint: Secondary | ICD-10-CM | POA: Diagnosis not present

## 2016-10-21 ENCOUNTER — Other Ambulatory Visit: Payer: Self-pay | Admitting: Internal Medicine

## 2016-11-06 ENCOUNTER — Other Ambulatory Visit: Payer: BLUE CROSS/BLUE SHIELD | Admitting: Internal Medicine

## 2016-11-06 DIAGNOSIS — Z79899 Other long term (current) drug therapy: Secondary | ICD-10-CM

## 2016-11-06 DIAGNOSIS — E785 Hyperlipidemia, unspecified: Secondary | ICD-10-CM

## 2016-11-06 DIAGNOSIS — R7302 Impaired glucose tolerance (oral): Secondary | ICD-10-CM | POA: Diagnosis not present

## 2016-11-06 LAB — HEPATIC FUNCTION PANEL
ALBUMIN: 4.5 g/dL (ref 3.6–5.1)
ALK PHOS: 84 U/L (ref 33–130)
ALT: 44 U/L — ABNORMAL HIGH (ref 6–29)
AST: 27 U/L (ref 10–35)
BILIRUBIN INDIRECT: 0.5 mg/dL (ref 0.2–1.2)
BILIRUBIN TOTAL: 0.6 mg/dL (ref 0.2–1.2)
Bilirubin, Direct: 0.1 mg/dL (ref <=0.2)
TOTAL PROTEIN: 7.1 g/dL (ref 6.1–8.1)

## 2016-11-06 LAB — LIPID PANEL
CHOL/HDL RATIO: 4.6 ratio (ref <5.0)
Cholesterol: 179 mg/dL (ref <200)
HDL: 39 mg/dL — AB (ref >50)
LDL Cholesterol: 110 mg/dL — ABNORMAL HIGH (ref <100)
TRIGLYCERIDES: 149 mg/dL (ref <150)
VLDL: 30 mg/dL (ref <30)

## 2016-11-07 LAB — MICROALBUMIN / CREATININE URINE RATIO
Creatinine, Urine: 22 mg/dL (ref 20–320)
MICROALB UR: 2.1 mg/dL
MICROALB/CREAT RATIO: 95 ug/mg{creat} — AB (ref <30)

## 2016-11-07 LAB — HEMOGLOBIN A1C
HEMOGLOBIN A1C: 5.7 % — AB (ref <5.7)
MEAN PLASMA GLUCOSE: 117 mg/dL

## 2016-11-08 ENCOUNTER — Ambulatory Visit (INDEPENDENT_AMBULATORY_CARE_PROVIDER_SITE_OTHER): Payer: BLUE CROSS/BLUE SHIELD | Admitting: Internal Medicine

## 2016-11-08 VITALS — BP 120/78 | HR 76 | Temp 98.4°F | Ht 65.0 in | Wt 253.0 lb

## 2016-11-08 DIAGNOSIS — R7302 Impaired glucose tolerance (oral): Secondary | ICD-10-CM

## 2016-11-08 DIAGNOSIS — Z6841 Body Mass Index (BMI) 40.0 and over, adult: Secondary | ICD-10-CM

## 2016-11-08 DIAGNOSIS — M5416 Radiculopathy, lumbar region: Secondary | ICD-10-CM

## 2016-11-08 DIAGNOSIS — E784 Other hyperlipidemia: Secondary | ICD-10-CM

## 2016-11-08 DIAGNOSIS — E8881 Metabolic syndrome: Secondary | ICD-10-CM

## 2016-11-08 DIAGNOSIS — M79672 Pain in left foot: Secondary | ICD-10-CM

## 2016-11-08 DIAGNOSIS — I10 Essential (primary) hypertension: Secondary | ICD-10-CM

## 2016-11-08 DIAGNOSIS — M5417 Radiculopathy, lumbosacral region: Secondary | ICD-10-CM | POA: Diagnosis not present

## 2016-11-08 DIAGNOSIS — E7849 Other hyperlipidemia: Secondary | ICD-10-CM

## 2016-11-08 MED ORDER — GABAPENTIN 300 MG PO CAPS: 300.0000 mg | ORAL_CAPSULE | Freq: Two times a day (BID) | ORAL | 1 refills | Status: DC

## 2016-11-08 MED ORDER — DIAZEPAM 5 MG PO TABS: 5.0000 mg | ORAL_TABLET | Freq: Two times a day (BID) | ORAL | 1 refills | Status: DC | PRN
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
## 2016-11-11 ENCOUNTER — Other Ambulatory Visit: Payer: Self-pay | Admitting: Internal Medicine

## 2016-11-25 ENCOUNTER — Encounter: Payer: Self-pay | Admitting: Internal Medicine

## 2016-11-25 NOTE — Patient Instructions (Addendum)
Start gabapentin 4 radiculopathy, Valium at bedtime for insomnia and anxiety as well as muscle relaxant. Continue diet and exercise efforts. Continue same medications. Return in 4 weeks.

## 2016-12-03 ENCOUNTER — Encounter: Payer: Self-pay | Admitting: Internal Medicine

## 2016-12-03 ENCOUNTER — Ambulatory Visit (INDEPENDENT_AMBULATORY_CARE_PROVIDER_SITE_OTHER): Payer: BLUE CROSS/BLUE SHIELD | Admitting: Internal Medicine

## 2016-12-03 VITALS — BP 120/70 | HR 75 | Temp 97.2°F | Ht 65.0 in | Wt 252.0 lb

## 2016-12-03 DIAGNOSIS — E8881 Metabolic syndrome: Secondary | ICD-10-CM

## 2016-12-03 DIAGNOSIS — M5417 Radiculopathy, lumbosacral region: Secondary | ICD-10-CM | POA: Diagnosis not present

## 2016-12-03 DIAGNOSIS — E784 Other hyperlipidemia: Secondary | ICD-10-CM

## 2016-12-03 DIAGNOSIS — I10 Essential (primary) hypertension: Secondary | ICD-10-CM

## 2016-12-03 DIAGNOSIS — F17200 Nicotine dependence, unspecified, uncomplicated: Secondary | ICD-10-CM

## 2016-12-03 DIAGNOSIS — M5416 Radiculopathy, lumbar region: Secondary | ICD-10-CM

## 2016-12-03 DIAGNOSIS — Z6841 Body Mass Index (BMI) 40.0 and over, adult: Secondary | ICD-10-CM

## 2016-12-03 DIAGNOSIS — E119 Type 2 diabetes mellitus without complications: Secondary | ICD-10-CM | POA: Diagnosis not present

## 2016-12-03 DIAGNOSIS — E7849 Other hyperlipidemia: Secondary | ICD-10-CM

## 2016-12-03 DIAGNOSIS — R7302 Impaired glucose tolerance (oral): Secondary | ICD-10-CM

## 2016-12-03 MED ORDER — HYDROCODONE-ACETAMINOPHEN 10-325 MG PO TABS: 1.0000 | ORAL_TABLET | Freq: Three times a day (TID) | ORAL | 0 refills | Status: DC | PRN

## 2016-12-03 MED ORDER — GABAPENTIN 300 MG PO CAPS: ORAL_CAPSULE | ORAL | 3 refills | Status: DC

## 2016-12-03 NOTE — Progress Notes (Signed)
   Subjective:    Patient ID: Gabriella [name], female    DOB: 01-12-1956, 61 y.o.   MRN: [id number]  HPI 61 year old  Female with chronic low back pain in for 4 week follow up. At last visit was prescribed Gabapentin 300 mg bid which has helped pain some. Would like to try higher dose  however wanted to start slow so as not to cause sedation and nausea. Able to rest better at night. Also due to chronic pain, wants refill on Norco 10/.325 which she last had prescribed by me #90 in December 29,.2017. Pain is fairly well under control with sparing use of this med. Has helped to add gabapentin. MRI of LS spine March 2017 showed lumbar disc disease with promineent impingement L-3-L-4 and L4-5 and mild impimgement L2-3. No impingment at L5-S1. Does not want to see neurosugeon. Right now. Just getting over shoulder arthrodesis.  She also has essential hypertension, impaired glucose tolerance, history of smoking, metabolic syndrome, obesity hyperlipidemia.  Alo has significant cervical disc disease per C spine MRI June 2017. Review of Systems see above     Objective:   Physical Exam Pain with straight leg raising bilaterally. L4-5 has bilateral subarticular lateral recess stenosis with bilateral forminal stenosis. L3-L4 with similar picture.       Assessment:  Lumbar disc disease with chronic back pain  Plan:Increase Gabapentin to 300mg  twice a day. Refill Hydrocodone APAP to take sparingly for pain.  Consider referral to pain management.25 minutes spent with patient.  Addendum: 12/24/2016-patient has agreed to pain management referral. Records were faxed.

## 2016-12-03 NOTE — Patient Instructions (Signed)
Refill Hydrocodone APAP. If not willing to see neurosurgeon, consider referral to pain management.

## 2017-01-27 ENCOUNTER — Other Ambulatory Visit: Payer: Self-pay | Admitting: Internal Medicine

## 2017-02-06 ENCOUNTER — Other Ambulatory Visit: Payer: Self-pay | Admitting: Internal Medicine

## 2017-02-12 ENCOUNTER — Encounter: Payer: Self-pay | Admitting: Internal Medicine

## 2017-03-10 ENCOUNTER — Other Ambulatory Visit: Payer: Self-pay | Admitting: Internal Medicine

## 2017-03-19 ENCOUNTER — Telehealth: Payer: Self-pay | Admitting: Internal Medicine

## 2017-03-19 NOTE — Telephone Encounter (Signed)
Patient called to state that she received our letter and called to make her appointment with [facility].  Updated patient's cell number as well today.  Patient can be reached on her cell # at [phone] and we can leave messages there.    [facility] faxed appointment information.  Patient will be seen there by Dr. Hardin Negus on 05/07/17 @ 10:00 a.m.    [facility] [address], [place] Fax [phone]  ** Information also scanned in as well into Media.  **

## 2017-03-25 DIAGNOSIS — Z471 Aftercare following joint replacement surgery: Secondary | ICD-10-CM | POA: Diagnosis not present

## 2017-03-25 DIAGNOSIS — Z96611 Presence of right artificial shoulder joint: Secondary | ICD-10-CM | POA: Diagnosis not present

## 2017-04-05 ENCOUNTER — Other Ambulatory Visit: Payer: Self-pay | Admitting: Internal Medicine

## 2017-04-05 NOTE — Telephone Encounter (Signed)
Pt was sent to pain management. Has she been yet? Do they still want her on this?

## 2017-04-05 NOTE — Telephone Encounter (Signed)
Refill through October 

## 2017-04-05 NOTE — Telephone Encounter (Signed)
Appt is 05/07/17 with pain management, is this ok to refill?

## 2017-04-28 ENCOUNTER — Other Ambulatory Visit: Payer: Self-pay | Admitting: Internal Medicine

## 2017-04-28 NOTE — Telephone Encounter (Signed)
Please check with patient. She is not supposed to be taking Avapro and lisinopril. Please adjust med list.

## 2017-04-29 NOTE — Telephone Encounter (Signed)
LMTCB

## 2017-05-01 DIAGNOSIS — M19079 Primary osteoarthritis, unspecified ankle and foot: Secondary | ICD-10-CM | POA: Diagnosis not present

## 2017-05-01 DIAGNOSIS — M79672 Pain in left foot: Secondary | ICD-10-CM | POA: Diagnosis not present

## 2017-05-01 DIAGNOSIS — M25572 Pain in left ankle and joints of left foot: Secondary | ICD-10-CM | POA: Diagnosis not present

## 2017-05-01 DIAGNOSIS — M2140 Flat foot [pes planus] (acquired), unspecified foot: Secondary | ICD-10-CM | POA: Diagnosis not present

## 2017-05-02 ENCOUNTER — Other Ambulatory Visit: Payer: Self-pay | Admitting: Internal Medicine

## 2017-05-02 NOTE — Telephone Encounter (Signed)
Refill once 

## 2017-05-05 ENCOUNTER — Other Ambulatory Visit: Payer: Self-pay | Admitting: Internal Medicine

## 2017-05-07 DIAGNOSIS — M150 Primary generalized (osteo)arthritis: Secondary | ICD-10-CM | POA: Diagnosis not present

## 2017-05-07 DIAGNOSIS — M47812 Spondylosis without myelopathy or radiculopathy, cervical region: Secondary | ICD-10-CM | POA: Diagnosis not present

## 2017-05-07 DIAGNOSIS — M47816 Spondylosis without myelopathy or radiculopathy, lumbar region: Secondary | ICD-10-CM | POA: Diagnosis not present

## 2017-05-07 DIAGNOSIS — Z79891 Long term (current) use of opiate analgesic: Secondary | ICD-10-CM | POA: Diagnosis not present

## 2017-05-07 DIAGNOSIS — G894 Chronic pain syndrome: Secondary | ICD-10-CM | POA: Diagnosis not present

## 2017-05-12 ENCOUNTER — Other Ambulatory Visit: Payer: Self-pay | Admitting: Internal Medicine

## 2017-05-14 ENCOUNTER — Other Ambulatory Visit: Payer: BLUE CROSS/BLUE SHIELD | Admitting: Internal Medicine

## 2017-05-14 DIAGNOSIS — E785 Hyperlipidemia, unspecified: Secondary | ICD-10-CM | POA: Diagnosis not present

## 2017-05-14 DIAGNOSIS — Z0000 Encounter for general adult medical examination without abnormal findings: Secondary | ICD-10-CM

## 2017-05-14 DIAGNOSIS — I10 Essential (primary) hypertension: Secondary | ICD-10-CM

## 2017-05-14 DIAGNOSIS — R7302 Impaired glucose tolerance (oral): Secondary | ICD-10-CM | POA: Diagnosis not present

## 2017-05-14 DIAGNOSIS — E6609 Other obesity due to excess calories: Secondary | ICD-10-CM

## 2017-05-15 LAB — COMPLETE METABOLIC PANEL WITH GFR
AG Ratio: 1.5 (calc) (ref 1.0–2.5)
ALKALINE PHOSPHATASE (APISO): 80 U/L (ref 33–130)
ALT: 28 U/L (ref 6–29)
AST: 21 U/L (ref 10–35)
Albumin: 4.1 g/dL (ref 3.6–5.1)
BUN: 21 mg/dL (ref 7–25)
CO2: 28 mmol/L (ref 20–32)
CREATININE: 0.59 mg/dL (ref 0.50–0.99)
Calcium: 9 mg/dL (ref 8.6–10.4)
Chloride: 104 mmol/L (ref 98–110)
GFR, Est African American: 115 mL/min/{1.73_m2} (ref >=60)
GFR, Est Non African American: 100 mL/min/{1.73_m2} (ref >=60)
GLOBULIN: 2.7 g/dL (ref 1.9–3.7)
GLUCOSE: 90 mg/dL (ref 65–99)
Potassium: 3.9 mmol/L (ref 3.5–5.3)
SODIUM: 140 mmol/L (ref 135–146)
Total Bilirubin: 0.7 mg/dL (ref 0.2–1.2)
Total Protein: 6.8 g/dL (ref 6.1–8.1)

## 2017-05-15 LAB — CBC WITH DIFFERENTIAL/PLATELET
BASOS ABS: 40 {cells}/uL (ref 0–200)
Basophils Relative: 0.6 %
EOS PCT: 0.3 %
Eosinophils Absolute: 20 cells/uL (ref 15–500)
HCT: 47.7 % — ABNORMAL HIGH (ref 35.0–45.0)
Hemoglobin: 15.9 g/dL — ABNORMAL HIGH (ref 11.7–15.5)
Lymphs Abs: 2224 cells/uL (ref 850–3900)
MCH: 32.6 pg (ref 27.0–33.0)
MCHC: 33.3 g/dL (ref 32.0–36.0)
MCV: 97.9 fL (ref 80.0–100.0)
MONOS PCT: 8.5 %
MPV: 10 fL (ref 7.5–12.5)
NEUTROS PCT: 57.4 %
Neutro Abs: 3846 cells/uL (ref 1500–7800)
PLATELETS: 257 10*3/uL (ref 140–400)
RBC: 4.87 10*6/uL (ref 3.80–5.10)
RDW: 12.4 % (ref 11.0–15.0)
Total Lymphocyte: 33.2 %
WBC mixed population: 570 cells/uL (ref 200–950)
WBC: 6.7 10*3/uL (ref 3.8–10.8)

## 2017-05-15 LAB — LIPID PANEL
CHOL/HDL RATIO: 4.5 (calc) (ref <5.0)
CHOLESTEROL: 168 mg/dL (ref <200)
HDL: 37 mg/dL — AB (ref >50)
LDL Cholesterol (Calc): 103 mg/dL (calc) — ABNORMAL HIGH
Non-HDL Cholesterol (Calc): 131 mg/dL (calc) — ABNORMAL HIGH (ref <130)
Triglycerides: 162 mg/dL — ABNORMAL HIGH (ref <150)

## 2017-05-15 LAB — HEMOGLOBIN A1C
Hgb A1c MFr Bld: 5.5 % of total Hgb (ref <5.7)
Mean Plasma Glucose: 111 (calc)
eAG (mmol/L): 6.2 (calc)

## 2017-05-15 LAB — MICROALBUMIN / CREATININE URINE RATIO
Creatinine, Urine: 11 mg/dL — ABNORMAL LOW (ref 20–275)
MICROALB UR: 0.9 mg/dL
MICROALB/CREAT RATIO: 82 ug/mg{creat} — AB (ref <30)

## 2017-05-15 LAB — VITAMIN D 25 HYDROXY (VIT D DEFICIENCY, FRACTURES): VIT D 25 HYDROXY: 35 ng/mL (ref 30–100)

## 2017-05-15 LAB — TSH: TSH: 1.32 m[IU]/L (ref 0.40–4.50)

## 2017-05-17 ENCOUNTER — Other Ambulatory Visit: Payer: BLUE CROSS/BLUE SHIELD | Admitting: Internal Medicine

## 2017-05-21 ENCOUNTER — Encounter: Payer: BLUE CROSS/BLUE SHIELD | Admitting: Internal Medicine

## 2017-05-30 ENCOUNTER — Ambulatory Visit (INDEPENDENT_AMBULATORY_CARE_PROVIDER_SITE_OTHER): Payer: BLUE CROSS/BLUE SHIELD | Admitting: Internal Medicine

## 2017-05-30 ENCOUNTER — Encounter: Payer: Self-pay | Admitting: Internal Medicine

## 2017-05-30 VITALS — BP 102/60 | HR 71 | Temp 97.8°F | Ht 63.0 in | Wt 229.0 lb

## 2017-05-30 DIAGNOSIS — Z8711 Personal history of peptic ulcer disease: Secondary | ICD-10-CM

## 2017-05-30 DIAGNOSIS — M79672 Pain in left foot: Secondary | ICD-10-CM

## 2017-05-30 DIAGNOSIS — Z0000 Encounter for general adult medical examination without abnormal findings: Secondary | ICD-10-CM

## 2017-05-30 DIAGNOSIS — Z6841 Body Mass Index (BMI) 40.0 and over, adult: Secondary | ICD-10-CM | POA: Diagnosis not present

## 2017-05-30 DIAGNOSIS — I10 Essential (primary) hypertension: Secondary | ICD-10-CM | POA: Diagnosis not present

## 2017-05-30 DIAGNOSIS — M48061 Spinal stenosis, lumbar region without neurogenic claudication: Secondary | ICD-10-CM | POA: Diagnosis not present

## 2017-05-30 DIAGNOSIS — F17200 Nicotine dependence, unspecified, uncomplicated: Secondary | ICD-10-CM | POA: Diagnosis not present

## 2017-05-30 DIAGNOSIS — E786 Lipoprotein deficiency: Secondary | ICD-10-CM | POA: Diagnosis not present

## 2017-05-30 DIAGNOSIS — E7849 Other hyperlipidemia: Secondary | ICD-10-CM

## 2017-05-30 DIAGNOSIS — E8881 Metabolic syndrome: Secondary | ICD-10-CM

## 2017-05-30 DIAGNOSIS — K580 Irritable bowel syndrome with diarrhea: Secondary | ICD-10-CM

## 2017-05-30 DIAGNOSIS — R7302 Impaired glucose tolerance (oral): Secondary | ICD-10-CM | POA: Diagnosis not present

## 2017-05-30 LAB — POCT URINALYSIS DIPSTICK
Bilirubin, UA: NEGATIVE
Blood, UA: NEGATIVE
Glucose, UA: NEGATIVE
Ketones, UA: NEGATIVE
Leukocytes, UA: NEGATIVE
NITRITE UA: NEGATIVE
Protein, UA: NEGATIVE
SPEC GRAV UA: 1.025 (ref 1.010–1.025)
UROBILINOGEN UA: 0.2 U/dL
pH, UA: 7 (ref 5.0–8.0)

## 2017-05-30 NOTE — Progress Notes (Signed)
Subjective:    Patient ID: Gabriella [name], female    DOB: 17-Jan-1956, 61 y.o.   MRN: [id number]  HPI 61 year old Female in today for health maintenance exam and evaluation of medical issues.  She has lost quite a bit of weight through diet this past year.  Previously weight 260 pounds October 2017 and now weighs 229 pounds.   Some situational stress at home with husband and his son who now runs the farm. Discussed at length. Pt has good perspective.  In December 2017 she had right reverse shoulder arthroplasty by Dr. supple.  She is done well since that time.  She is also seeing Dr. Para March at [facility] regarding left ankle pain as well as foot pain.  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  Was diagnosed with tibial tendinitis on the left with a spring ligament tear leading to lateral midfoot arthritis.  Needs to have MRI of the left ankle and foot if insurance will  approve.  She has a history of irritable bowel syndrome treated with Bentyl.  History of impaired glucose tolerance.  History of essential hypertension, obesity and hyperlipidemia.  She was referred to chronic pain management for chronic musculoskeletal pain regarding foot and ankle.  She does not abuse pain medication.  She continues to smoke and is smoked for well over 30 years.  No alcohol consumption.  Hospitalized for perforated duodenal ulcer requiring surgery April 2008.  History of debridement of anterior cruciate ligament right knee November 2004 at [name].  Social history: This is her second marriage.  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  She is raising her daughter son who has a history of attention deficit disorder.  He graduated from high school and lives with them.  Now working for [employer].  They actually have adopted him and he goes by Gabriella [name].  Family history: Mother died of complications of dementia with history  of severe hypertension.  Father with history of lung cancer.  Total of 5 brothers.  Younger 2 brothers have history of heart murmurs.  Recent lab work reviewed showing hemoglobin A1c 5.5%.  Vitamin D level is normal.  TSH is normal.  Triglycerides elevated at 162.  LDL cholesterol 103.  HDL cholesterol low at 37.  Total cholesterol 168.  CBC and complete metabolic panel normal.     Review of Systems no new complaints.  Main issue is left foot and ankle pain.     Objective:   Physical Exam  Constitutional: She is oriented to person, place, and time. She appears well-developed and well-nourished. No distress.  HENT:  Head: Normocephalic and atraumatic.  Right Ear: External ear normal.  Left Ear: External ear normal.  Mouth/Throat: Oropharynx is clear and moist.  Neck: Neck supple. No JVD present. No thyromegaly present.  Cardiovascular: Normal rate, regular rhythm, normal heart sounds and intact distal pulses.  No murmur heard. Pulmonary/Chest: Effort normal and breath sounds normal. No respiratory distress. She has no wheezes. She has no rales.  Breasts normal female  Abdominal: Soft. Bowel sounds are normal. She exhibits no distension. There is no tenderness. There is no rebound and no guarding.  Genitourinary:  Genitourinary Comments: Pap done 2017.  Bimanual normal.  Musculoskeletal: She exhibits no edema.  Lymphadenopathy:    She has no cervical adenopathy.  Neurological: She is alert and oriented to person, place, and time. She has normal reflexes. No cranial nerve  deficit. Coordination normal.  Skin: Skin is warm and dry. No rash noted. She is not diaphoretic.  Psychiatric: She has a normal mood and affect. Her behavior is normal. Judgment and thought content normal.  Vitals reviewed.         Assessment & Plan:  History of impaired glucose tolerance Dyal with normal hemoglobin A1c with diet exercise and weight loss  Hyperlipidemia  Essential  hypertension-stable  Chronic left foot and ankle pain currently being seen by Dr.  Para March  Chronic pain management [facility]  Obesity-continue diet exercise and weight loss efforts which have been very good  Situational stress at farm  History of lumbar spinal stenosis with chronic back pain  BMI 40.57  Plan: Continue current medications and return in 6 months

## 2017-06-01 ENCOUNTER — Other Ambulatory Visit: Payer: Self-pay | Admitting: Internal Medicine

## 2017-06-04 ENCOUNTER — Other Ambulatory Visit: Payer: Self-pay | Admitting: Internal Medicine

## 2017-06-15 ENCOUNTER — Encounter: Payer: Self-pay | Admitting: Internal Medicine

## 2017-06-15 NOTE — Patient Instructions (Signed)
It was a pleasure to see you today.  Congratulations on weight loss.  Continue same medications and follow-up in 6 months.

## 2017-07-02 DIAGNOSIS — M47816 Spondylosis without myelopathy or radiculopathy, lumbar region: Secondary | ICD-10-CM | POA: Diagnosis not present

## 2017-07-02 DIAGNOSIS — G894 Chronic pain syndrome: Secondary | ICD-10-CM | POA: Diagnosis not present

## 2017-07-02 DIAGNOSIS — M150 Primary generalized (osteo)arthritis: Secondary | ICD-10-CM | POA: Diagnosis not present

## 2017-07-02 DIAGNOSIS — M47812 Spondylosis without myelopathy or radiculopathy, cervical region: Secondary | ICD-10-CM | POA: Diagnosis not present

## 2017-07-12 ENCOUNTER — Other Ambulatory Visit: Payer: Self-pay

## 2017-07-12 ENCOUNTER — Ambulatory Visit (INDEPENDENT_AMBULATORY_CARE_PROVIDER_SITE_OTHER): Payer: BLUE CROSS/BLUE SHIELD

## 2017-07-12 ENCOUNTER — Ambulatory Visit (HOSPITAL_COMMUNITY)
Admission: EM | Admit: 2017-07-12 | Discharge: 2017-07-12 | Disposition: A | Discharge status: Home / Self Care | Payer: BLUE CROSS/BLUE SHIELD | Attending: Internal Medicine | Admitting: Internal Medicine

## 2017-07-12 ENCOUNTER — Encounter (HOSPITAL_COMMUNITY): Payer: Self-pay | Admitting: Emergency Medicine

## 2017-07-12 DIAGNOSIS — S2242XA Multiple fractures of ribs, left side, initial encounter for closed fracture: Secondary | ICD-10-CM | POA: Diagnosis not present

## 2017-07-12 DIAGNOSIS — W19XXXA Unspecified fall, initial encounter: Secondary | ICD-10-CM | POA: Diagnosis not present

## 2017-07-12 DIAGNOSIS — S2232XA Fracture of one rib, left side, initial encounter for closed fracture: Secondary | ICD-10-CM | POA: Diagnosis not present

## 2017-07-12 MED ORDER — KETOROLAC TROMETHAMINE 60 MG/2ML IM SOLN
INTRAMUSCULAR | Status: AC
  Filled 2017-07-12: qty 2

## 2017-07-12 MED ORDER — CYCLOBENZAPRINE HCL 5 MG PO TABS
ORAL_TABLET | ORAL | 0 refills | Status: DC
Start: 2017-07-12 — End: 2020-09-05

## 2017-07-12 MED ORDER — KETOROLAC TROMETHAMINE 60 MG/2ML IM SOLN
60.0000 mg | Freq: Once | INTRAMUSCULAR | Status: AC
  Administered 2017-07-12: 60 mg via INTRAMUSCULAR

## 2017-07-12 NOTE — ED Triage Notes (Signed)
Pt states she tripped and fell in the dark in her yard and injured her left flank rib cage.

## 2017-07-12 NOTE — Discharge Instructions (Signed)
You fractured the 6th rib on the left. This will take time to heal. Rest. May take the Hydrocodone you have, may use the Flexeril as needed, but caution in use with Diazepam as it may make you more sleepy. If any worrisome symptoms f/u.

## 2017-07-12 NOTE — ED Provider Notes (Signed)
[place]    CSN: [id number] Arrival date & time: 07/12/17  1718     History   Chief Complaint Chief Complaint  Patient presents with  . Rib Injury    HPI Gabriella [name] is a 61 y.o. female.   Presents with left sided lower rib pain after a fall in her yard 6 days ago. She has been using a stretch belt for support since that time but her pain is worsening. Pain with deep breath. No dyspnea is noted.       Past Medical History:  Diagnosis Date  . Cancer (HCC)    FACIAL CANCER   . Dependent edema   . Diabetes mellitus   . Headache   . Hypertension   . Hypertriglyceridemia   . Hypokalemia   . Metrorrhagia   . Obesity   . Peptic ulcer disease   . PONV (postoperative nausea and vomiting)   . SUI (stress urinary incontinence, female)   . Tobacco abuse     Patient Active Problem List   Diagnosis Date Noted  . S/P reverse total shoulder arthroplasty, 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  . Chronic back pain 10/05/2015  . Musculoskeletal pain 02/23/2014  . Proteinuria 02/16/2013  . Metabolic syndrome 76/16/0737  . Foot pain 08/31/2012  . Achilles tendinitis 08/31/2012  . Glucose intolerance (impaired glucose tolerance) 05/27/2011  . Hyperlipidemia 05/27/2011  . Hypertension 05/27/2011  . Obesity 05/27/2011  . History of peptic ulcer disease 05/27/2011    Past Surgical History:  Procedure Laterality Date  . ANTERIOR CRUCIATE LIGAMENT REPAIR     BILATERAL  92+95  . arthroscopic knee surgery  11/04   right knee  . CYST EXCISION     OVARY     . perforated duodenal ulcer  04/08  . RADIOACTIVE SEED IMPLANT    . REVERSE SHOULDER ARTHROPLASTY Right 07/05/2016   Procedure: REVERSE SHOULDER ARTHROPLASTY;  Surgeon: Justice Britain, MD;  Location: [facility];  Service: Orthopedics;  Laterality: Right;    OB History    No data available       Home Medications    Prior to Admission medications   Medication Sig Start Date End Date Taking? Authorizing Provider  allopurinol  (ZYLOPRIM) 300 MG tablet TAKE 1 TABLET (300 MG TOTAL) BY MOUTH DAILY. 05/05/17  Yes Baxley, Cresenciano Lick, MD  amLODipine (NORVASC) 10 MG tablet TAKE 1 TABLET (10 MG TOTAL) BY MOUTH DAILY. 04/29/17  Yes Baxley, Cresenciano Lick, MD  Cholecalciferol (HM VITAMIN D3) 4000 UNITS CAPS Take 4,000 Units by mouth daily.   Yes [provider]  diazepam (VALIUM) 5 MG tablet TAKE 1 TABLET BY MOUTH EVERY 12 HOURS AS NEEDED FOR ANXIETY 05/02/17  Yes Baxley, Cresenciano Lick, MD  furosemide (LASIX) 40 MG tablet TAKE 1 TABLET BY MOUTH DAILY 03/11/17  Yes Baxley, Cresenciano Lick, MD  gabapentin (NEURONTIN) 300 MG capsule TAKE 1 CAPSULE (300 MG TOTAL) BY MOUTH 2 (TWO) TIMES DAILY. 06/03/17  Yes Baxley, Cresenciano Lick, MD  irbesartan (AVAPRO) 300 MG tablet TAKE 1 TABLET BY MOUTH EVERY DAY 04/29/17  Yes Baxley, Cresenciano Lick, MD  KLOR-CON M20 20 MEQ tablet TAKE 1 TABLET (20 MEQ TOTAL) BY MOUTH 3 (THREE) TIMES DAILY. 05/13/17  Yes Baxley, Cresenciano Lick, MD  metoprolol succinate (TOPROL-XL) 50 MG 24 hr tablet TAKE 1 TABLET (50 MG TOTAL) BY MOUTH DAILY. TAKE WITH OR IMMEDIATELY FOLLOWING A MEAL. Patient taking differently: Take 50 mg by mouth daily. TAKE WITH OR IMMEDIATELY FOLLOWING A MEAL. 06/25/16  Yes Tedra Senegal  J, MD  Omega-3 Fatty Acids (FISH OIL) 1000 MG CAPS Take 1,000 mg by mouth daily. WILL STOP PRIOR TO PROCEDURE   Yes [provider]  pantoprazole (PROTONIX) 40 MG tablet TAKE 1 TABLET (40 MG TOTAL) BY MOUTH DAILY. 06/04/17  Yes Elby Showers, MD  simvastatin (ZOCOR) 20 MG tablet TAKE 1 TABLET (20 MG TOTAL) BY MOUTH AT BEDTIME. 05/05/17  Yes Baxley, Cresenciano Lick, MD  vitamin A 10000 UNIT capsule Take 10,000 Units by mouth daily.   Yes [provider]  vitamin E 1000 UNIT capsule Take 1,000 Units by mouth daily.   Yes [provider]  cyclobenzaprine (FLEXERIL) 5 MG tablet Take 1 tablets po every 8 hours as needed 07/12/17   Bjorn Pippin, PA-C  glucose blood (BAYER CONTOUR NEXT TEST) test strip Use as instructed 07/18/16   Elby Showers,  MD  HYDROcodone-acetaminophen (NORCO) 10-325 MG tablet Take 1 tablet by mouth every 8 (eight) hours as needed for moderate pain. 12/03/16   Elby Showers, MD  ONE TOUCH ULTRA TEST test strip TEST 2 TIMES A DAY 06/28/16   Elby Showers, MD    Family History Family History  Problem Relation Age of Onset  . Hypertension Mother   . Cancer Father     Social History Social History   Tobacco Use  . Smoking status: Current Every Day Smoker    Packs/day: 1.00    Types: Cigarettes    Last attempt to quit: 07/30/2008    Years since quitting: 8.9  . Smokeless tobacco: Never Used  Substance Use Topics  . Alcohol use: No    Alcohol/week: 0.0 oz  . Drug use: No     Allergies   No known allergies   Review of Systems Review of Systems  Respiratory: Negative for cough, chest tightness and shortness of breath.      Physical Exam Triage Vital Signs ED Triage Vitals  Enc Vitals Group     BP 07/12/17 1738 134/87     Pulse Rate 07/12/17 1738 77     Resp --      Temp 07/12/17 1738 98.8 F (37.1 C)     Temp Source 07/12/17 1738 Oral     SpO2 07/12/17 1738 95 %     Weight --      Height --      Head Circumference --      Peak Flow --      Pain Score 07/12/17 1736 10     Pain Loc --      Pain Edu? --      Excl. in [place]? --    No data found.  Updated Vital Signs BP 134/87 (BP Location: Right Arm)   Pulse 77   Temp 98.8 F (37.1 C) (Oral)   SpO2 95%   Visual Acuity Right Eye Distance:   Left Eye Distance:   Bilateral Distance:    Right Eye Near:   Left Eye Near:    Bilateral Near:     Physical Exam  Constitutional: She appears well-developed and well-nourished.  Pulmonary/Chest: Effort normal and breath sounds normal. She exhibits tenderness.  No eccymosis is noted. Pain to light palpation along the left lateral rib without swelling or obvious deformity  Skin: Skin is warm and dry. No rash noted. She is not diaphoretic. No erythema.  Psychiatric: Her behavior is  normal.  Nursing note and vitals reviewed.    UC Treatments / Results  Labs (all labs ordered  are listed, but only abnormal results are displayed) Labs Reviewed - No data to display  EKG  EKG Interpretation None       Radiology Dg Ribs Unilateral W/chest Left  Result Date: 07/12/2017 CLINICAL DATA:  Left lower lateral rib pain.  Fall 6 days ago. EXAM: LEFT RIBS AND CHEST - 3+ VIEW COMPARISON:  06/30/2004 FINDINGS: Fracture is noted through the left sixth rib. Left base atelectasis and trace left effusion. No pneumothorax. Right lung is clear. Heart is normal size. IMPRESSION: Left lateral sixth rib fracture. Trace left effusion and left base atelectasis. No pneumothorax. Electronically Signed   By: Rolm Baptise M.D.   On: 07/12/2017 18:12    Procedures Procedures (including critical care time)  Medications Ordered in UC Medications  ketorolac (TORADOL) injection 60 mg (not administered)     Initial Impression / Assessment and Plan / UC Course  I have reviewed the triage vital signs and the nursing notes.  Pertinent labs & imaging results that were available during my care of the patient were reviewed by me and considered in my medical decision making (see chart for details).     Treat symptomatically. Heat/rest/topicals are ok. Taking Norco under the care of pain management therefore added mild Flexeril if needed to help rest, caution with diazepam. May take 5 weeks additional to heal. FU if worsens.   Final Clinical Impressions(s) / UC Diagnoses   Final diagnoses:  Closed fracture of multiple ribs of left side, initial encounter    ED Discharge Orders        Ordered    cyclobenzaprine (FLEXERIL) 5 MG tablet     07/12/17 1841       Controlled Substance Prescriptions [place] Controlled Substance Registry consulted? Yes, I have consulted the  Controlled Substances Registry for this patient, and feel the risk/benefit ratio today is favorable for proceeding with this  prescription for a controlled substance.   Bjorn Pippin, [place] 07/12/17 [phone]

## 2017-07-13 ENCOUNTER — Other Ambulatory Visit: Payer: Self-pay | Admitting: Internal Medicine

## 2017-07-20 ENCOUNTER — Other Ambulatory Visit: Payer: Self-pay | Admitting: Internal Medicine

## 2017-07-20 NOTE — Telephone Encounter (Signed)
Does pt really need a refill- should only take sparingly

## 2017-07-24 NOTE — Telephone Encounter (Signed)
Please call her. Is she not on chronic pain meds per Pain Management? That may be why urgent care did not give her meds for pain. Exactly what is she taking? What does she want refill on?

## 2017-07-24 NOTE — Telephone Encounter (Signed)
Spoke with patient, she said takes this medication every morning. She said she went to get an xray done about 2 weeks ago and she didn't get anything for the pain she said she's in pain and she's in a healing process and all she was prescribed was a muscle relaxer and she would like a refill on this rx.

## 2017-07-24 NOTE — Telephone Encounter (Signed)
She is taking hydrocodone as needed, she wanted a refill on Valium. Called in okay by Dr. Renold Genta.

## 2017-08-25 ENCOUNTER — Other Ambulatory Visit: Payer: Self-pay | Admitting: Internal Medicine

## 2017-08-25 NOTE — Telephone Encounter (Signed)
Please call pt. There is a request for Valium refill was given #90 in December with directions to take q 12 hours so should NOT be out. Once again, how often does she take it - thought it was just prn. Also, Her Avapro may have been recalled. Did she check with pharmacy? There is a fax request for refill here.

## 2017-08-27 NOTE — Telephone Encounter (Signed)
Called [facility] in [place] to ask about Irbesartan (Avapro).  They advised that she last picked this refill up (90 day) in December at the [place] store, so I called their location @ [phone] and spoke with pharmacist there; they advised that they didn't use any of the brands or manufacturers that had been effected.  So, she's fine on her Irbesartan.

## 2017-08-27 NOTE — Telephone Encounter (Signed)
Need to know how she is taking this should not need it 3 times a day. Please call her

## 2017-08-27 NOTE — Telephone Encounter (Signed)
Spoke with patient she said she only takes it in the morning and if needed she will take one at night maybe twice a week. She said she does not need a refill now.

## 2017-09-03 DIAGNOSIS — M150 Primary generalized (osteo)arthritis: Secondary | ICD-10-CM | POA: Diagnosis not present

## 2017-09-03 DIAGNOSIS — M25551 Pain in right hip: Secondary | ICD-10-CM | POA: Diagnosis not present

## 2017-09-03 DIAGNOSIS — M47812 Spondylosis without myelopathy or radiculopathy, cervical region: Secondary | ICD-10-CM | POA: Diagnosis not present

## 2017-09-03 DIAGNOSIS — M47816 Spondylosis without myelopathy or radiculopathy, lumbar region: Secondary | ICD-10-CM | POA: Diagnosis not present

## 2017-09-16 ENCOUNTER — Other Ambulatory Visit: Payer: Self-pay | Admitting: Anesthesiology

## 2017-09-16 ENCOUNTER — Ambulatory Visit
Admission: RE | Admit: 2017-09-16 | Discharge: 2017-09-16 | Disposition: A | Discharge status: Home / Self Care | Payer: BLUE CROSS/BLUE SHIELD | Source: Ambulatory Visit | Attending: Anesthesiology | Admitting: Anesthesiology

## 2017-09-16 DIAGNOSIS — R52 Pain, unspecified: Secondary | ICD-10-CM

## 2017-09-16 DIAGNOSIS — M1611 Unilateral primary osteoarthritis, right hip: Secondary | ICD-10-CM | POA: Diagnosis not present

## 2017-10-06 ENCOUNTER — Other Ambulatory Visit: Payer: Self-pay | Admitting: Internal Medicine

## 2017-10-08 ENCOUNTER — Other Ambulatory Visit: Payer: Self-pay | Admitting: Internal Medicine

## 2017-10-08 NOTE — Telephone Encounter (Signed)
#  90 with no refill

## 2017-10-09 ENCOUNTER — Other Ambulatory Visit: Payer: Self-pay | Admitting: Internal Medicine

## 2017-10-09 DIAGNOSIS — E559 Vitamin D deficiency, unspecified: Secondary | ICD-10-CM

## 2017-10-09 DIAGNOSIS — D649 Anemia, unspecified: Secondary | ICD-10-CM

## 2017-10-15 DIAGNOSIS — M25551 Pain in right hip: Secondary | ICD-10-CM | POA: Diagnosis not present

## 2017-10-15 DIAGNOSIS — M47812 Spondylosis without myelopathy or radiculopathy, cervical region: Secondary | ICD-10-CM | POA: Diagnosis not present

## 2017-10-15 DIAGNOSIS — M47816 Spondylosis without myelopathy or radiculopathy, lumbar region: Secondary | ICD-10-CM | POA: Diagnosis not present

## 2017-10-15 DIAGNOSIS — M150 Primary generalized (osteo)arthritis: Secondary | ICD-10-CM | POA: Diagnosis not present

## 2017-10-17 ENCOUNTER — Other Ambulatory Visit: Payer: Self-pay

## 2017-10-17 MED ORDER — IRBESARTAN 300 MG PO TABS: 300.0000 mg | ORAL_TABLET | Freq: Every day | ORAL | 3 refills | Status: DC

## 2017-11-22 ENCOUNTER — Other Ambulatory Visit: Payer: Self-pay | Admitting: Internal Medicine

## 2017-11-22 DIAGNOSIS — Z0000 Encounter for general adult medical examination without abnormal findings: Secondary | ICD-10-CM

## 2017-11-22 DIAGNOSIS — E785 Hyperlipidemia, unspecified: Secondary | ICD-10-CM

## 2017-11-22 DIAGNOSIS — I10 Essential (primary) hypertension: Secondary | ICD-10-CM

## 2017-11-22 DIAGNOSIS — R7302 Impaired glucose tolerance (oral): Secondary | ICD-10-CM

## 2017-11-22 NOTE — Addendum Note (Signed)
Addended by: Mady Haagensen on: 11/22/2017 04:09 PM   Modules accepted: Orders

## 2017-11-26 DIAGNOSIS — M150 Primary generalized (osteo)arthritis: Secondary | ICD-10-CM | POA: Diagnosis not present

## 2017-11-26 DIAGNOSIS — M47816 Spondylosis without myelopathy or radiculopathy, lumbar region: Secondary | ICD-10-CM | POA: Diagnosis not present

## 2017-11-26 DIAGNOSIS — M25551 Pain in right hip: Secondary | ICD-10-CM | POA: Diagnosis not present

## 2017-11-26 DIAGNOSIS — M47812 Spondylosis without myelopathy or radiculopathy, cervical region: Secondary | ICD-10-CM | POA: Diagnosis not present

## 2017-11-28 ENCOUNTER — Other Ambulatory Visit: Payer: BLUE CROSS/BLUE SHIELD | Admitting: Internal Medicine

## 2017-11-28 ENCOUNTER — Other Ambulatory Visit: Payer: Self-pay | Admitting: Internal Medicine

## 2017-11-29 ENCOUNTER — Other Ambulatory Visit: Payer: BLUE CROSS/BLUE SHIELD | Admitting: Internal Medicine

## 2017-11-29 DIAGNOSIS — E785 Hyperlipidemia, unspecified: Secondary | ICD-10-CM | POA: Diagnosis not present

## 2017-11-29 DIAGNOSIS — R7302 Impaired glucose tolerance (oral): Secondary | ICD-10-CM | POA: Diagnosis not present

## 2017-11-29 DIAGNOSIS — Z0000 Encounter for general adult medical examination without abnormal findings: Secondary | ICD-10-CM | POA: Diagnosis not present

## 2017-11-29 DIAGNOSIS — I10 Essential (primary) hypertension: Secondary | ICD-10-CM | POA: Diagnosis not present

## 2017-11-29 LAB — HEPATIC FUNCTION PANEL
AG Ratio: 1.5 (calc) (ref 1.0–2.5)
ALBUMIN MSPROF: 4.1 g/dL (ref 3.6–5.1)
ALT: 27 U/L (ref 6–29)
AST: 21 U/L (ref 10–35)
Alkaline phosphatase (APISO): 76 U/L (ref 33–130)
BILIRUBIN INDIRECT: 0.4 mg/dL (ref 0.2–1.2)
Bilirubin, Direct: 0.1 mg/dL (ref 0.0–0.2)
GLOBULIN: 2.7 g/dL (ref 1.9–3.7)
TOTAL PROTEIN: 6.8 g/dL (ref 6.1–8.1)
Total Bilirubin: 0.5 mg/dL (ref 0.2–1.2)

## 2017-11-29 LAB — LIPID PANEL
CHOL/HDL RATIO: 4.5 (calc) (ref <5.0)
CHOLESTEROL: 156 mg/dL (ref <200)
HDL: 35 mg/dL — AB (ref >50)
LDL Cholesterol (Calc): 95 mg/dL (calc)
Non-HDL Cholesterol (Calc): 121 mg/dL (calc) (ref <130)
Triglycerides: 155 mg/dL — ABNORMAL HIGH (ref <150)

## 2017-11-30 LAB — HEMOGLOBIN A1C
Hgb A1c MFr Bld: 5.6 % of total Hgb (ref <5.7)
Mean Plasma Glucose: 114 (calc)
eAG (mmol/L): 6.3 (calc)

## 2017-11-30 LAB — MICROALBUMIN / CREATININE URINE RATIO
CREATININE, URINE: 17 mg/dL — AB (ref 20–275)
MICROALB UR: 2 mg/dL
Microalb Creat Ratio: 118 mcg/mg creat — ABNORMAL HIGH (ref <30)

## 2017-12-05 ENCOUNTER — Encounter: Payer: Self-pay | Admitting: Internal Medicine

## 2017-12-05 ENCOUNTER — Ambulatory Visit (INDEPENDENT_AMBULATORY_CARE_PROVIDER_SITE_OTHER): Payer: BLUE CROSS/BLUE SHIELD | Admitting: Internal Medicine

## 2017-12-05 VITALS — BP 122/70 | HR 83 | Ht 63.0 in | Wt 233.0 lb

## 2017-12-05 DIAGNOSIS — M48061 Spinal stenosis, lumbar region without neurogenic claudication: Secondary | ICD-10-CM | POA: Diagnosis not present

## 2017-12-05 DIAGNOSIS — R7302 Impaired glucose tolerance (oral): Secondary | ICD-10-CM | POA: Diagnosis not present

## 2017-12-05 DIAGNOSIS — I10 Essential (primary) hypertension: Secondary | ICD-10-CM

## 2017-12-05 DIAGNOSIS — E785 Hyperlipidemia, unspecified: Secondary | ICD-10-CM | POA: Diagnosis not present

## 2017-12-05 DIAGNOSIS — M79672 Pain in left foot: Secondary | ICD-10-CM | POA: Diagnosis not present

## 2017-12-05 DIAGNOSIS — Z6841 Body Mass Index (BMI) 40.0 and over, adult: Secondary | ICD-10-CM | POA: Diagnosis not present

## 2017-12-23 NOTE — Progress Notes (Signed)
   Subjective:    Patient ID: Gabriella [name], female    DOB: 13-Nov-1955, 62 y.o.   MRN: [id number]  HPI Patient in today for 26-month recheck.  She continues to see chronic pain specialist, Dr. Hardin Negus.  Says she is doing fairly well.  Some tension and stress with family issues.  This was discussed by her at length today.  She has a history of hypertension and hyperlipidemia as well as impaired glucose tolerance.  Triglycerides are nearly normal at 155, total cholesterol is 156 and LDL cholesterol is 95.  Hemoglobin A1c is 5.6% which is excellent.  Liver functions are normal.    Review of Systems see above     Objective:   Physical Exam Blood pressure excellent at 122/70.  Remains overweight.  BMI is 41.27 has gained 4 pounds since November.  He is to get a bit more exercise.  In December had a fall in her yard resulting in a left lateral sixth rib fracture.  Neck is supple without JVD thyromegaly or carotid bruits.  Chest clear to auscultation.  Cardiac exam regular rate and rhythm normal S1 and S2.  Extremities without edema.     Assessment & Plan:  Situational family stress-discussed  Essential hypertension-stable on current regimen  Impaired glucose tolerance  Obesity  Hyperlipidemia-stable  Chronic back pain and foot pain -seen by Dr. Hardin Negus for chronic pain management  Plan: Continue current regimen.  Return in 6 months for physical examination.

## 2017-12-23 NOTE — Patient Instructions (Signed)
Continue diet exercise weight loss efforts.  Return in 6 months for physical examination.  No change in medications.

## 2017-12-29 ENCOUNTER — Other Ambulatory Visit: Payer: Self-pay | Admitting: Internal Medicine

## 2018-01-06 ENCOUNTER — Other Ambulatory Visit: Payer: Self-pay | Admitting: Internal Medicine

## 2018-01-06 DIAGNOSIS — Z1231 Encounter for screening mammogram for malignant neoplasm of breast: Secondary | ICD-10-CM

## 2018-01-07 DIAGNOSIS — M25551 Pain in right hip: Secondary | ICD-10-CM | POA: Diagnosis not present

## 2018-01-07 DIAGNOSIS — M150 Primary generalized (osteo)arthritis: Secondary | ICD-10-CM | POA: Diagnosis not present

## 2018-01-07 DIAGNOSIS — M47816 Spondylosis without myelopathy or radiculopathy, lumbar region: Secondary | ICD-10-CM | POA: Diagnosis not present

## 2018-01-07 DIAGNOSIS — M47812 Spondylosis without myelopathy or radiculopathy, cervical region: Secondary | ICD-10-CM | POA: Diagnosis not present

## 2018-01-15 ENCOUNTER — Other Ambulatory Visit: Payer: Self-pay | Admitting: Internal Medicine

## 2018-01-15 NOTE — Telephone Encounter (Signed)
Refill x 90 days 

## 2018-01-21 ENCOUNTER — Other Ambulatory Visit: Payer: Self-pay | Admitting: Internal Medicine

## 2018-01-28 ENCOUNTER — Ambulatory Visit
Admission: RE | Admit: 2018-01-28 | Discharge: 2018-01-28 | Disposition: A | Discharge status: Home / Self Care | Payer: BLUE CROSS/BLUE SHIELD | Source: Ambulatory Visit | Attending: Internal Medicine | Admitting: Internal Medicine

## 2018-01-28 DIAGNOSIS — Z1231 Encounter for screening mammogram for malignant neoplasm of breast: Secondary | ICD-10-CM

## 2018-02-06 ENCOUNTER — Other Ambulatory Visit: Payer: Self-pay

## 2018-02-06 MED ORDER — OLMESARTAN MEDOXOMIL 40 MG PO TABS: 40.0000 mg | ORAL_TABLET | Freq: Every day | ORAL | 0 refills | Status: DC

## 2018-02-19 DIAGNOSIS — M47812 Spondylosis without myelopathy or radiculopathy, cervical region: Secondary | ICD-10-CM | POA: Diagnosis not present

## 2018-02-19 DIAGNOSIS — M47816 Spondylosis without myelopathy or radiculopathy, lumbar region: Secondary | ICD-10-CM | POA: Diagnosis not present

## 2018-02-19 DIAGNOSIS — M150 Primary generalized (osteo)arthritis: Secondary | ICD-10-CM | POA: Diagnosis not present

## 2018-02-19 DIAGNOSIS — M25551 Pain in right hip: Secondary | ICD-10-CM | POA: Diagnosis not present

## 2018-03-02 ENCOUNTER — Other Ambulatory Visit: Payer: Self-pay | Admitting: Internal Medicine

## 2018-03-03 ENCOUNTER — Other Ambulatory Visit: Payer: Self-pay | Admitting: Anesthesiology

## 2018-03-03 DIAGNOSIS — M47816 Spondylosis without myelopathy or radiculopathy, lumbar region: Secondary | ICD-10-CM

## 2018-03-06 ENCOUNTER — Ambulatory Visit
Admission: RE | Admit: 2018-03-06 | Discharge: 2018-03-06 | Disposition: A | Discharge status: Home / Self Care | Payer: BLUE CROSS/BLUE SHIELD | Source: Ambulatory Visit | Attending: Anesthesiology | Admitting: Anesthesiology

## 2018-03-06 ENCOUNTER — Other Ambulatory Visit: Payer: Self-pay | Admitting: Internal Medicine

## 2018-03-06 DIAGNOSIS — M545 Low back pain: Secondary | ICD-10-CM | POA: Diagnosis not present

## 2018-03-06 DIAGNOSIS — M47816 Spondylosis without myelopathy or radiculopathy, lumbar region: Secondary | ICD-10-CM

## 2018-03-29 ENCOUNTER — Other Ambulatory Visit: Payer: Self-pay | Admitting: Internal Medicine

## 2018-04-02 DIAGNOSIS — M25551 Pain in right hip: Secondary | ICD-10-CM | POA: Diagnosis not present

## 2018-04-02 DIAGNOSIS — M150 Primary generalized (osteo)arthritis: Secondary | ICD-10-CM | POA: Diagnosis not present

## 2018-04-02 DIAGNOSIS — M47817 Spondylosis without myelopathy or radiculopathy, lumbosacral region: Secondary | ICD-10-CM | POA: Diagnosis not present

## 2018-04-02 DIAGNOSIS — M48062 Spinal stenosis, lumbar region with neurogenic claudication: Secondary | ICD-10-CM | POA: Diagnosis not present

## 2018-04-23 DIAGNOSIS — M47817 Spondylosis without myelopathy or radiculopathy, lumbosacral region: Secondary | ICD-10-CM | POA: Diagnosis not present

## 2018-04-23 DIAGNOSIS — M25551 Pain in right hip: Secondary | ICD-10-CM | POA: Diagnosis not present

## 2018-04-23 DIAGNOSIS — M150 Primary generalized (osteo)arthritis: Secondary | ICD-10-CM | POA: Diagnosis not present

## 2018-04-23 DIAGNOSIS — M48062 Spinal stenosis, lumbar region with neurogenic claudication: Secondary | ICD-10-CM | POA: Diagnosis not present

## 2018-05-01 ENCOUNTER — Other Ambulatory Visit: Payer: Self-pay | Admitting: Internal Medicine

## 2018-05-14 DIAGNOSIS — M47816 Spondylosis without myelopathy or radiculopathy, lumbar region: Secondary | ICD-10-CM | POA: Diagnosis not present

## 2018-05-22 DIAGNOSIS — M47817 Spondylosis without myelopathy or radiculopathy, lumbosacral region: Secondary | ICD-10-CM | POA: Diagnosis not present

## 2018-05-22 DIAGNOSIS — M150 Primary generalized (osteo)arthritis: Secondary | ICD-10-CM | POA: Diagnosis not present

## 2018-05-22 DIAGNOSIS — M25551 Pain in right hip: Secondary | ICD-10-CM | POA: Diagnosis not present

## 2018-05-22 DIAGNOSIS — M48062 Spinal stenosis, lumbar region with neurogenic claudication: Secondary | ICD-10-CM | POA: Diagnosis not present

## 2018-05-28 ENCOUNTER — Other Ambulatory Visit: Payer: Self-pay | Admitting: Internal Medicine

## 2018-05-28 DIAGNOSIS — F17200 Nicotine dependence, unspecified, uncomplicated: Secondary | ICD-10-CM

## 2018-05-28 DIAGNOSIS — Z6841 Body Mass Index (BMI) 40.0 and over, adult: Secondary | ICD-10-CM

## 2018-05-28 DIAGNOSIS — M79672 Pain in left foot: Secondary | ICD-10-CM

## 2018-05-28 DIAGNOSIS — E785 Hyperlipidemia, unspecified: Secondary | ICD-10-CM

## 2018-05-28 DIAGNOSIS — R7302 Impaired glucose tolerance (oral): Secondary | ICD-10-CM

## 2018-05-28 DIAGNOSIS — M48061 Spinal stenosis, lumbar region without neurogenic claudication: Secondary | ICD-10-CM

## 2018-05-28 DIAGNOSIS — I10 Essential (primary) hypertension: Secondary | ICD-10-CM

## 2018-05-28 DIAGNOSIS — K580 Irritable bowel syndrome with diarrhea: Secondary | ICD-10-CM

## 2018-05-28 DIAGNOSIS — Z8711 Personal history of peptic ulcer disease: Secondary | ICD-10-CM

## 2018-05-28 DIAGNOSIS — E8881 Metabolic syndrome: Secondary | ICD-10-CM

## 2018-05-28 DIAGNOSIS — IMO0001 Reserved for inherently not codable concepts without codable children: Secondary | ICD-10-CM

## 2018-05-28 DIAGNOSIS — E786 Lipoprotein deficiency: Secondary | ICD-10-CM

## 2018-05-31 ENCOUNTER — Other Ambulatory Visit: Payer: Self-pay | Admitting: Internal Medicine

## 2018-06-02 ENCOUNTER — Other Ambulatory Visit (INDEPENDENT_AMBULATORY_CARE_PROVIDER_SITE_OTHER): Payer: BLUE CROSS/BLUE SHIELD | Admitting: Internal Medicine

## 2018-06-02 DIAGNOSIS — D649 Anemia, unspecified: Secondary | ICD-10-CM | POA: Diagnosis not present

## 2018-06-02 DIAGNOSIS — Z0000 Encounter for general adult medical examination without abnormal findings: Secondary | ICD-10-CM | POA: Diagnosis not present

## 2018-06-02 DIAGNOSIS — E785 Hyperlipidemia, unspecified: Secondary | ICD-10-CM | POA: Diagnosis not present

## 2018-06-02 DIAGNOSIS — R7302 Impaired glucose tolerance (oral): Secondary | ICD-10-CM

## 2018-06-02 DIAGNOSIS — I10 Essential (primary) hypertension: Secondary | ICD-10-CM | POA: Diagnosis not present

## 2018-06-02 DIAGNOSIS — K580 Irritable bowel syndrome with diarrhea: Secondary | ICD-10-CM

## 2018-06-02 DIAGNOSIS — M79672 Pain in left foot: Secondary | ICD-10-CM

## 2018-06-02 DIAGNOSIS — E559 Vitamin D deficiency, unspecified: Secondary | ICD-10-CM | POA: Diagnosis not present

## 2018-06-02 DIAGNOSIS — F17200 Nicotine dependence, unspecified, uncomplicated: Secondary | ICD-10-CM

## 2018-06-02 DIAGNOSIS — E118 Type 2 diabetes mellitus with unspecified complications: Secondary | ICD-10-CM | POA: Diagnosis not present

## 2018-06-02 DIAGNOSIS — Z6841 Body Mass Index (BMI) 40.0 and over, adult: Secondary | ICD-10-CM

## 2018-06-02 DIAGNOSIS — E786 Lipoprotein deficiency: Secondary | ICD-10-CM

## 2018-06-02 DIAGNOSIS — E8881 Metabolic syndrome: Secondary | ICD-10-CM

## 2018-06-02 DIAGNOSIS — M48061 Spinal stenosis, lumbar region without neurogenic claudication: Secondary | ICD-10-CM

## 2018-06-02 DIAGNOSIS — Z8711 Personal history of peptic ulcer disease: Secondary | ICD-10-CM

## 2018-06-02 LAB — POCT URINALYSIS DIPSTICK
Appearance: NEGATIVE
BILIRUBIN UA: NEGATIVE
Blood, UA: NEGATIVE
GLUCOSE UA: NEGATIVE
KETONES UA: NEGATIVE
Leukocytes, UA: NEGATIVE
Nitrite, UA: NEGATIVE
Odor: NEGATIVE
Protein, UA: POSITIVE — AB
SPEC GRAV UA: 1.01 (ref 1.010–1.025)
Urobilinogen, UA: 0.2 E.U./dL
pH, UA: 7 (ref 5.0–8.0)

## 2018-06-02 NOTE — Addendum Note (Signed)
Addended by: Mady Haagensen on: 06/02/2018 10:59 AM   Modules accepted: Orders

## 2018-06-03 LAB — HEMOGLOBIN A1C
Hgb A1c MFr Bld: 5.7 % of total Hgb — ABNORMAL HIGH (ref <5.7)
Mean Plasma Glucose: 117 (calc)
eAG (mmol/L): 6.5 (calc)

## 2018-06-03 LAB — CBC WITH DIFFERENTIAL/PLATELET
Basophils Absolute: 29 cells/uL (ref 0–200)
Basophils Relative: 0.5 %
EOS ABS: 12 {cells}/uL — AB (ref 15–500)
Eosinophils Relative: 0.2 %
HCT: 46.7 % — ABNORMAL HIGH (ref 35.0–45.0)
Hemoglobin: 16 g/dL — ABNORMAL HIGH (ref 11.7–15.5)
Lymphs Abs: 2059 cells/uL (ref 850–3900)
MCH: 33.1 pg — AB (ref 27.0–33.0)
MCHC: 34.3 g/dL (ref 32.0–36.0)
MCV: 96.5 fL (ref 80.0–100.0)
MONOS PCT: 7.9 %
MPV: 9.9 fL (ref 7.5–12.5)
NEUTROS PCT: 55.9 %
Neutro Abs: 3242 cells/uL (ref 1500–7800)
Platelets: 257 10*3/uL (ref 140–400)
RBC: 4.84 10*6/uL (ref 3.80–5.10)
RDW: 12.4 % (ref 11.0–15.0)
TOTAL LYMPHOCYTE: 35.5 %
WBC: 5.8 10*3/uL (ref 3.8–10.8)
WBCMIX: 458 {cells}/uL (ref 200–950)

## 2018-06-03 LAB — LIPID PANEL
CHOL/HDL RATIO: 3.6 (calc) (ref <5.0)
CHOLESTEROL: 141 mg/dL (ref <200)
HDL: 39 mg/dL — AB (ref >50)
LDL CHOLESTEROL (CALC): 78 mg/dL
Non-HDL Cholesterol (Calc): 102 mg/dL (calc) (ref <130)
Triglycerides: 137 mg/dL (ref <150)

## 2018-06-03 LAB — MICROALBUMIN / CREATININE URINE RATIO
Creatinine, Urine: 92 mg/dL (ref 20–275)
Microalb Creat Ratio: 82 mcg/mg creat — ABNORMAL HIGH (ref <30)
Microalb, Ur: 7.5 mg/dL

## 2018-06-03 LAB — COMPLETE METABOLIC PANEL WITH GFR
AG RATIO: 1.5 (calc) (ref 1.0–2.5)
ALBUMIN MSPROF: 4.1 g/dL (ref 3.6–5.1)
ALKALINE PHOSPHATASE (APISO): 77 U/L (ref 33–130)
ALT: 22 U/L (ref 6–29)
AST: 21 U/L (ref 10–35)
BUN: 25 mg/dL (ref 7–25)
CALCIUM: 9.4 mg/dL (ref 8.6–10.4)
CO2: 28 mmol/L (ref 20–32)
CREATININE: 0.56 mg/dL (ref 0.50–0.99)
Chloride: 106 mmol/L (ref 98–110)
GFR, EST NON AFRICAN AMERICAN: 101 mL/min/{1.73_m2} (ref >=60)
GFR, Est African American: 117 mL/min/{1.73_m2} (ref >=60)
GLOBULIN: 2.7 g/dL (ref 1.9–3.7)
Glucose, Bld: 98 mg/dL (ref 65–99)
POTASSIUM: 3.9 mmol/L (ref 3.5–5.3)
Sodium: 143 mmol/L (ref 135–146)
Total Bilirubin: 0.4 mg/dL (ref 0.2–1.2)
Total Protein: 6.8 g/dL (ref 6.1–8.1)

## 2018-06-03 LAB — VITAMIN D 25 HYDROXY (VIT D DEFICIENCY, FRACTURES): VIT D 25 HYDROXY: 39 ng/mL (ref 30–100)

## 2018-06-03 LAB — TSH: TSH: 0.71 mIU/L (ref 0.40–4.50)

## 2018-06-04 DIAGNOSIS — M47816 Spondylosis without myelopathy or radiculopathy, lumbar region: Secondary | ICD-10-CM | POA: Diagnosis not present

## 2018-06-05 ENCOUNTER — Encounter: Payer: Self-pay | Admitting: Internal Medicine

## 2018-06-05 ENCOUNTER — Ambulatory Visit (INDEPENDENT_AMBULATORY_CARE_PROVIDER_SITE_OTHER): Payer: BLUE CROSS/BLUE SHIELD | Admitting: Internal Medicine

## 2018-06-05 VITALS — BP 100/80 | HR 68 | Ht 63.0 in | Wt 228.0 lb

## 2018-06-05 DIAGNOSIS — M48061 Spinal stenosis, lumbar region without neurogenic claudication: Secondary | ICD-10-CM

## 2018-06-05 DIAGNOSIS — E8881 Metabolic syndrome: Secondary | ICD-10-CM

## 2018-06-05 DIAGNOSIS — E785 Hyperlipidemia, unspecified: Secondary | ICD-10-CM

## 2018-06-05 DIAGNOSIS — E786 Lipoprotein deficiency: Secondary | ICD-10-CM

## 2018-06-05 DIAGNOSIS — K580 Irritable bowel syndrome with diarrhea: Secondary | ICD-10-CM

## 2018-06-05 DIAGNOSIS — F17200 Nicotine dependence, unspecified, uncomplicated: Secondary | ICD-10-CM

## 2018-06-05 DIAGNOSIS — R7302 Impaired glucose tolerance (oral): Secondary | ICD-10-CM | POA: Diagnosis not present

## 2018-06-05 DIAGNOSIS — I10 Essential (primary) hypertension: Secondary | ICD-10-CM

## 2018-06-05 DIAGNOSIS — M5416 Radiculopathy, lumbar region: Secondary | ICD-10-CM

## 2018-06-05 DIAGNOSIS — Z8711 Personal history of peptic ulcer disease: Secondary | ICD-10-CM

## 2018-06-05 DIAGNOSIS — M79672 Pain in left foot: Secondary | ICD-10-CM

## 2018-06-05 DIAGNOSIS — IMO0001 Reserved for inherently not codable concepts without codable children: Secondary | ICD-10-CM

## 2018-06-05 DIAGNOSIS — Z0000 Encounter for general adult medical examination without abnormal findings: Secondary | ICD-10-CM | POA: Diagnosis not present

## 2018-06-05 DIAGNOSIS — Z6841 Body Mass Index (BMI) 40.0 and over, adult: Secondary | ICD-10-CM

## 2018-06-05 DIAGNOSIS — E119 Type 2 diabetes mellitus without complications: Secondary | ICD-10-CM | POA: Insufficient documentation

## 2018-06-05 NOTE — Progress Notes (Signed)
Subjective:    Patient ID: Gabriella [name], female    DOB: April 22, 1956, 62 y.o.   MRN: [id number]  HPI 62 year old Female for health maintenance exam and evlaution of medical issues. Hx impaired glucose tolerance, obesity, osteoarthritis, hyperlipidemia. Hx HTN, chronic back pain and ankle pain.  Followed at pain clinic.  History of irritable bowel syndrome treated with Bentyl.  Needs eye exam. Reminded. Eye physician deceased recently.  Had recent lumbar injection by Dr. Hardin Negus at pain clinic.  Weight decreased from 233 in May to 229 today.  Previously weighed 250 pounds October 2017.   Continues dieting.  Not really able to exercise due to musculoskeletal issues.  In December 2017 she had right reverse shoulder arthroplasty by Dr. Onnie Graham and is done well with that.  Has seen Dr. Para March at [facility] regarding chronic left ankle pain and foot pain.  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  Continues to smoke and has smoked for well over 30 years.  No alcohol consumption.  Hospitalized for perforated duodenal ulcer requiring surgery April 2008.  History of debridement of anterior cruciate ligament right knee November 2004 at [facility].  Social history: This is her second marriage.  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  They have cider and seasonal vegetables.  Family history: Mother died of complications of dementia with history of severe hypertension.  Father with history of lung cancer.  Total of 5 brothers.  #2 brothers have history of heart murmurs.           Review of Systems  Constitutional: Positive for fatigue.  HENT: Negative.   Respiratory: Negative.   Cardiovascular: Negative.   Gastrointestinal:       Irritable bowel syndrome with diarrhea  Genitourinary: Negative.   Musculoskeletal:  Positive for arthralgias, back pain and gait problem.  Psychiatric/Behavioral: Negative.        Objective:   Physical Exam  Constitutional: She is oriented to person, place, and time. She appears well-developed and well-nourished.  HENT:  Head: Normocephalic and atraumatic.  Right Ear: External ear normal.  Left Ear: External ear normal.  Eyes: Pupils are equal, round, and reactive to light. Conjunctivae are normal. Right eye exhibits no discharge. Left eye exhibits no discharge.  Neck: No JVD present. No thyromegaly present.  Cardiovascular: Normal rate, regular rhythm, normal heart sounds and intact distal pulses.  No murmur heard. Pulmonary/Chest: Breath sounds normal. No respiratory distress. She has no wheezes. She has no rales.  Breasts normal female without masses  Abdominal: Soft. Bowel sounds are normal. She exhibits no distension and no mass. There is no tenderness. There is no rebound.  Genitourinary:  Genitourinary Comments: Pap done 2017.  Bimanual normal.  Musculoskeletal: She exhibits no edema.  Lymphadenopathy:    She has no cervical adenopathy.  Neurological: She is alert and oriented to person, place, and time. She displays normal reflexes. No cranial nerve deficit.  Skin: Skin is warm and dry.  Psychiatric: She has a normal mood and affect. Her behavior is normal. Judgment and thought content normal.  Vitals reviewed.         Assessment & Plan:  Chronic back and ankle pain.  Currently on narcotic pain medication per Dr. Hardin Negus and has injections in her back from time to time.  History of impaired glucose tolerance.  Hyperlipidemia  Essential hypertension  Metabolic syndrome  Chronic left ankle pain seen by Dr. Para March  Obesity  History of lumbar spinal stenosis with chronic back pain  Plan: Continue current medications, continue diet efforts and return in 6 months.  Patient not interested in smoking cessation.  Vitamin D level is normal as is  TSH.  Lipid panel normal on statin with low HDL of 39.  Hemoglobin A1c 5.7%.  Had mammogram in July.  Reminded about diabetic eye exam but says eye doctor died recently.  Return in 6 months.

## 2018-06-19 DIAGNOSIS — M48062 Spinal stenosis, lumbar region with neurogenic claudication: Secondary | ICD-10-CM | POA: Diagnosis not present

## 2018-06-19 DIAGNOSIS — M47817 Spondylosis without myelopathy or radiculopathy, lumbosacral region: Secondary | ICD-10-CM | POA: Diagnosis not present

## 2018-06-19 DIAGNOSIS — M150 Primary generalized (osteo)arthritis: Secondary | ICD-10-CM | POA: Diagnosis not present

## 2018-06-19 DIAGNOSIS — M25551 Pain in right hip: Secondary | ICD-10-CM | POA: Diagnosis not present

## 2018-06-28 NOTE — Patient Instructions (Signed)
It was a pleasure to see you today.  Continue to work on diet and weight loss.  Continue current medications and follow-up in 6 months.  Please have diabetic eye exam.

## 2018-07-17 ENCOUNTER — Ambulatory Visit: Payer: BLUE CROSS/BLUE SHIELD | Admitting: Internal Medicine

## 2018-07-17 ENCOUNTER — Telehealth: Payer: Self-pay | Admitting: Internal Medicine

## 2018-07-17 ENCOUNTER — Encounter: Payer: Self-pay | Admitting: Internal Medicine

## 2018-07-17 ENCOUNTER — Ambulatory Visit
Admission: RE | Admit: 2018-07-17 | Discharge: 2018-07-17 | Disposition: A | Discharge status: Home / Self Care | Payer: BLUE CROSS/BLUE SHIELD | Source: Ambulatory Visit | Attending: Internal Medicine | Admitting: Internal Medicine

## 2018-07-17 VITALS — BP 130/90 | HR 88 | Temp 99.0°F | Ht 63.0 in | Wt 232.0 lb

## 2018-07-17 DIAGNOSIS — J22 Unspecified acute lower respiratory infection: Secondary | ICD-10-CM

## 2018-07-17 DIAGNOSIS — J101 Influenza due to other identified influenza virus with other respiratory manifestations: Secondary | ICD-10-CM | POA: Diagnosis not present

## 2018-07-17 DIAGNOSIS — R05 Cough: Secondary | ICD-10-CM | POA: Diagnosis not present

## 2018-07-17 DIAGNOSIS — R509 Fever, unspecified: Secondary | ICD-10-CM | POA: Diagnosis not present

## 2018-07-17 DIAGNOSIS — M150 Primary generalized (osteo)arthritis: Secondary | ICD-10-CM | POA: Diagnosis not present

## 2018-07-17 DIAGNOSIS — R52 Pain, unspecified: Secondary | ICD-10-CM

## 2018-07-17 DIAGNOSIS — M48062 Spinal stenosis, lumbar region with neurogenic claudication: Secondary | ICD-10-CM | POA: Diagnosis not present

## 2018-07-17 DIAGNOSIS — M25551 Pain in right hip: Secondary | ICD-10-CM | POA: Diagnosis not present

## 2018-07-17 DIAGNOSIS — M47817 Spondylosis without myelopathy or radiculopathy, lumbosacral region: Secondary | ICD-10-CM | POA: Diagnosis not present

## 2018-07-17 LAB — POCT INFLUENZA A/B
INFLUENZA A, POC: POSITIVE — AB
Influenza B, POC: NEGATIVE

## 2018-07-17 MED ORDER — LEVOFLOXACIN 500 MG PO TABS: 500.0000 mg | ORAL_TABLET | Freq: Every day | ORAL | 0 refills | Status: DC

## 2018-07-17 MED ORDER — HYDROCOD POLST-CPM POLST ER 10-8 MG/5ML PO SUER: 5.0000 mL | Freq: Two times a day (BID) | ORAL | 0 refills | Status: DC | PRN

## 2018-07-17 NOTE — Telephone Encounter (Signed)
Patient called requesting antibiotics for ear pain.  Advised patient would need appointment.  Patient to be seen today at 4pm.

## 2018-07-17 NOTE — Progress Notes (Signed)
   Subjective:    Patient ID: Gabriella [name], female    DOB: May 29, 1956, 62 y.o.   MRN: [id number]  HPI Patient declined flu vaccine at time of physical exam in November.  Around Thanksgiving, she came down with a respiratory infection and she says it [place]  Had a lot of cough and congestion.  Recently has had a 4-day history of severe headache.  Has had fever and some chills.  Still has cough. Has malaise and fatigue.    Review of Systems no nausea vomiting or diarrhea     Objective:   Physical Exam  Skin warm and dry.  Nodes none.  Looks fatigued.  TMs are clear.  Pharynx is slightly injected.  Neck is supple without adenopathy.  Chest is clear to auscultation.  Rapid flu test positive for influenza A         Assessment & Plan:  Influenza A  Acute lower respiratory infection  Plan: Tamiflu 75 mg twice daily for 5 days, Levaquin 500 mg daily for 10 days.  Tylenol for fever.  Rest and drink plenty of fluids.  Tussionex 1 teaspoon p.o. every 12 hours as needed cough:

## 2018-07-18 ENCOUNTER — Other Ambulatory Visit: Payer: Self-pay

## 2018-07-18 MED ORDER — OSELTAMIVIR PHOSPHATE 75 MG PO CAPS: 75.0000 mg | ORAL_CAPSULE | Freq: Two times a day (BID) | ORAL | 0 refills | Status: DC

## 2018-07-26 ENCOUNTER — Encounter: Payer: Self-pay | Admitting: Internal Medicine

## 2018-07-26 NOTE — Patient Instructions (Addendum)
Tamiflu 75 mg twice daily for 5 days.  Levaquin 500 mg twice daily for 10 days.  Tussionex 1 teaspoon p.o. every 12 hours as needed cough.  Tylenol for fever.  Rest and drink plenty of fluids.

## 2018-08-20 ENCOUNTER — Other Ambulatory Visit: Payer: Self-pay | Admitting: Internal Medicine

## 2018-08-26 ENCOUNTER — Other Ambulatory Visit: Payer: Self-pay | Admitting: Internal Medicine

## 2018-08-30 ENCOUNTER — Other Ambulatory Visit: Payer: Self-pay | Admitting: Internal Medicine

## 2018-08-31 ENCOUNTER — Other Ambulatory Visit: Payer: Self-pay | Admitting: Internal Medicine

## 2018-09-17 DIAGNOSIS — M150 Primary generalized (osteo)arthritis: Secondary | ICD-10-CM | POA: Diagnosis not present

## 2018-09-17 DIAGNOSIS — M47817 Spondylosis without myelopathy or radiculopathy, lumbosacral region: Secondary | ICD-10-CM | POA: Diagnosis not present

## 2018-09-17 DIAGNOSIS — M25551 Pain in right hip: Secondary | ICD-10-CM | POA: Diagnosis not present

## 2018-09-17 DIAGNOSIS — M48062 Spinal stenosis, lumbar region with neurogenic claudication: Secondary | ICD-10-CM | POA: Diagnosis not present

## 2018-10-28 DIAGNOSIS — M48062 Spinal stenosis, lumbar region with neurogenic claudication: Secondary | ICD-10-CM | POA: Diagnosis not present

## 2018-10-28 DIAGNOSIS — M150 Primary generalized (osteo)arthritis: Secondary | ICD-10-CM | POA: Diagnosis not present

## 2018-10-28 DIAGNOSIS — M47817 Spondylosis without myelopathy or radiculopathy, lumbosacral region: Secondary | ICD-10-CM | POA: Diagnosis not present

## 2018-10-28 DIAGNOSIS — M25551 Pain in right hip: Secondary | ICD-10-CM | POA: Diagnosis not present

## 2018-12-04 ENCOUNTER — Ambulatory Visit: Payer: BLUE CROSS/BLUE SHIELD | Admitting: Internal Medicine

## 2018-12-09 DIAGNOSIS — M25551 Pain in right hip: Secondary | ICD-10-CM | POA: Diagnosis not present

## 2018-12-09 DIAGNOSIS — M48061 Spinal stenosis, lumbar region without neurogenic claudication: Secondary | ICD-10-CM | POA: Diagnosis not present

## 2018-12-09 DIAGNOSIS — M47817 Spondylosis without myelopathy or radiculopathy, lumbosacral region: Secondary | ICD-10-CM | POA: Diagnosis not present

## 2018-12-09 DIAGNOSIS — M150 Primary generalized (osteo)arthritis: Secondary | ICD-10-CM | POA: Diagnosis not present

## 2018-12-11 ENCOUNTER — Other Ambulatory Visit: Payer: BLUE CROSS/BLUE SHIELD | Admitting: Internal Medicine

## 2018-12-11 ENCOUNTER — Other Ambulatory Visit: Payer: Self-pay

## 2018-12-11 DIAGNOSIS — R7302 Impaired glucose tolerance (oral): Secondary | ICD-10-CM

## 2018-12-11 DIAGNOSIS — E785 Hyperlipidemia, unspecified: Secondary | ICD-10-CM

## 2018-12-12 LAB — LIPID PANEL
Cholesterol: 147 mg/dL (ref <200)
HDL: 36 mg/dL — ABNORMAL LOW (ref >=50)
LDL Cholesterol (Calc): 88 mg/dL (calc)
Non-HDL Cholesterol (Calc): 111 mg/dL (calc) (ref <130)
Total CHOL/HDL Ratio: 4.1 (calc) (ref <5.0)
Triglycerides: 126 mg/dL (ref <150)

## 2018-12-12 LAB — HEPATIC FUNCTION PANEL
AG Ratio: 1.6 (calc) (ref 1.0–2.5)
ALT: 30 U/L — ABNORMAL HIGH (ref 6–29)
AST: 26 U/L (ref 10–35)
Albumin: 4 g/dL (ref 3.6–5.1)
Alkaline phosphatase (APISO): 81 U/L (ref 37–153)
Bilirubin, Direct: 0.1 mg/dL (ref 0.0–0.2)
Globulin: 2.5 g/dL (calc) (ref 1.9–3.7)
Indirect Bilirubin: 0.3 mg/dL (calc) (ref 0.2–1.2)
Total Bilirubin: 0.4 mg/dL (ref 0.2–1.2)
Total Protein: 6.5 g/dL (ref 6.1–8.1)

## 2018-12-12 LAB — HEMOGLOBIN A1C
Hgb A1c MFr Bld: 5.7 % of total Hgb — ABNORMAL HIGH (ref <5.7)
Mean Plasma Glucose: 117 (calc)
eAG (mmol/L): 6.5 (calc)

## 2018-12-12 LAB — MICROALBUMIN / CREATININE URINE RATIO
Creatinine, Urine: 54 mg/dL (ref 20–275)
Microalb Creat Ratio: 356 mcg/mg creat — ABNORMAL HIGH (ref <30)
Microalb, Ur: 19.2 mg/dL

## 2018-12-15 ENCOUNTER — Encounter: Payer: Self-pay | Admitting: Internal Medicine

## 2018-12-15 ENCOUNTER — Other Ambulatory Visit: Payer: Self-pay

## 2018-12-15 ENCOUNTER — Ambulatory Visit: Payer: BLUE CROSS/BLUE SHIELD | Admitting: Internal Medicine

## 2018-12-15 VITALS — BP 120/70 | Ht 63.0 in | Wt 227.0 lb

## 2018-12-15 DIAGNOSIS — M79672 Pain in left foot: Secondary | ICD-10-CM | POA: Diagnosis not present

## 2018-12-15 DIAGNOSIS — Z6841 Body Mass Index (BMI) 40.0 and over, adult: Secondary | ICD-10-CM

## 2018-12-15 DIAGNOSIS — E8881 Metabolic syndrome: Secondary | ICD-10-CM

## 2018-12-15 DIAGNOSIS — E781 Pure hyperglyceridemia: Secondary | ICD-10-CM

## 2018-12-15 DIAGNOSIS — E786 Lipoprotein deficiency: Secondary | ICD-10-CM

## 2018-12-15 DIAGNOSIS — I10 Essential (primary) hypertension: Secondary | ICD-10-CM

## 2018-12-15 DIAGNOSIS — M5416 Radiculopathy, lumbar region: Secondary | ICD-10-CM

## 2018-12-15 DIAGNOSIS — R7302 Impaired glucose tolerance (oral): Secondary | ICD-10-CM

## 2018-12-15 NOTE — Progress Notes (Signed)
   Subjective:    Patient ID: Gabriella [name], female    DOB: [birth year], 63 y.o.   MRN: [id number]  HPI 63 year old Female for 6 month recheck.  In December 2019 she had influenza A and should strongly consider getting influenza vaccine in early fall.  She has a history of hyperlipidemia, hypertension, chronic back and ankle pain as well as osteoarthritis, impaired glucose tolerance and obesity.  She is followed for chronic pain management clinic.  Does not surgery on ankle or back.  Patient is smoked for well over 30 years.  Does not consume alcohol.  Please see the dictation June 05, 2018 regarding chronic left ankle and foot pain previously evaluated at [facility].  In December 2017 she had right reverse shoulder arthroplasty by Dr. Onnie Graham.  Reminded regarding annual diabetic eye exam.  She has a low HDL cholesterol of 36 but LDL is normal at 88 and triglycerides are normal at 126.  Total cholesterol was 147.  Liver panel is normal with the exception of a slightly elevated SGPT at 30 normal being up to 29.  Hemoglobin A1c is 5.7%.  This is stable.  Her BMI is 40.21.  Weight is 227 pounds.  In November her weight was 228 pounds.  She has been cooking quite a bit and not getting so much exercise during the pandemic.    Review of Systems no new complaints     Objective:   Physical Exam Vital signs reviewed.  Skin warm and dry.  No thyromegaly.  No carotid bruits.  Chest clear to auscultation.  Cardiac exam regular rate and rhythm normal S1 and S2.  Extremities without pitting edema.       Assessment & Plan:  Impaired glucose tolerance-stable  Obesity-continue to work on diet and exercise.  BMI is 40.21.  Essential hypertension-stable on current regimen  Chronic pain involving ankle and back-continue to see chronic pain management physician  Hyperlipidemia-lipids under good control  Plan: She will be due for physical exam in 6 months.  Recommend flu vaccine in the early fall.   Continue current medications.

## 2019-01-01 ENCOUNTER — Other Ambulatory Visit: Payer: Self-pay

## 2019-01-01 MED ORDER — DIAZEPAM 5 MG PO TABS: 5.0000 mg | ORAL_TABLET | Freq: Two times a day (BID) | ORAL | 3 refills | Status: DC | PRN

## 2019-01-13 DIAGNOSIS — M25551 Pain in right hip: Secondary | ICD-10-CM | POA: Diagnosis not present

## 2019-01-13 DIAGNOSIS — M47817 Spondylosis without myelopathy or radiculopathy, lumbosacral region: Secondary | ICD-10-CM | POA: Diagnosis not present

## 2019-01-13 DIAGNOSIS — M48062 Spinal stenosis, lumbar region with neurogenic claudication: Secondary | ICD-10-CM | POA: Diagnosis not present

## 2019-01-13 DIAGNOSIS — M150 Primary generalized (osteo)arthritis: Secondary | ICD-10-CM | POA: Diagnosis not present

## 2019-01-14 NOTE — Patient Instructions (Signed)
It was a pleasure to see you today.  Continue current medications.  Please continue to work on diet exercise and weight loss.  Follow-up in 6 months.  Recommend flu vaccine in the early fall.

## 2019-02-10 DIAGNOSIS — M47817 Spondylosis without myelopathy or radiculopathy, lumbosacral region: Secondary | ICD-10-CM | POA: Diagnosis not present

## 2019-02-10 DIAGNOSIS — M150 Primary generalized (osteo)arthritis: Secondary | ICD-10-CM | POA: Diagnosis not present

## 2019-02-10 DIAGNOSIS — M25551 Pain in right hip: Secondary | ICD-10-CM | POA: Diagnosis not present

## 2019-02-10 DIAGNOSIS — M48061 Spinal stenosis, lumbar region without neurogenic claudication: Secondary | ICD-10-CM | POA: Diagnosis not present

## 2019-02-12 ENCOUNTER — Telehealth: Payer: Self-pay | Admitting: Internal Medicine

## 2019-02-12 MED ORDER — ALLOPURINOL 300 MG PO TABS: ORAL_TABLET | ORAL | 1 refills | Status: DC

## 2019-02-12 NOTE — Telephone Encounter (Signed)
Gabriella [name] [phone]  [facility] [place]  allopurinol (ZYLOPRIM) 300 MG tablet   [name] called to say she needs a refill, she stated that the pharmacy said they had sent request and not heard back from [place].

## 2019-03-04 ENCOUNTER — Other Ambulatory Visit: Payer: Self-pay | Admitting: Internal Medicine

## 2019-03-12 DIAGNOSIS — M25551 Pain in right hip: Secondary | ICD-10-CM | POA: Diagnosis not present

## 2019-03-12 DIAGNOSIS — M47817 Spondylosis without myelopathy or radiculopathy, lumbosacral region: Secondary | ICD-10-CM | POA: Diagnosis not present

## 2019-03-12 DIAGNOSIS — M150 Primary generalized (osteo)arthritis: Secondary | ICD-10-CM | POA: Diagnosis not present

## 2019-03-12 DIAGNOSIS — G894 Chronic pain syndrome: Secondary | ICD-10-CM | POA: Diagnosis not present

## 2019-03-22 ENCOUNTER — Other Ambulatory Visit: Payer: Self-pay | Admitting: Internal Medicine

## 2019-03-25 ENCOUNTER — Other Ambulatory Visit: Payer: Self-pay | Admitting: Internal Medicine

## 2019-03-25 DIAGNOSIS — Z1231 Encounter for screening mammogram for malignant neoplasm of breast: Secondary | ICD-10-CM

## 2019-05-06 ENCOUNTER — Ambulatory Visit
Admission: RE | Admit: 2019-05-06 | Discharge: 2019-05-06 | Disposition: A | Discharge status: Home / Self Care | Payer: BC Managed Care – PPO | Source: Ambulatory Visit | Attending: Internal Medicine | Admitting: Internal Medicine

## 2019-05-06 ENCOUNTER — Other Ambulatory Visit: Payer: Self-pay

## 2019-05-06 DIAGNOSIS — Z1231 Encounter for screening mammogram for malignant neoplasm of breast: Secondary | ICD-10-CM | POA: Diagnosis not present

## 2019-05-12 DIAGNOSIS — M48061 Spinal stenosis, lumbar region without neurogenic claudication: Secondary | ICD-10-CM | POA: Diagnosis not present

## 2019-05-12 DIAGNOSIS — M47817 Spondylosis without myelopathy or radiculopathy, lumbosacral region: Secondary | ICD-10-CM | POA: Diagnosis not present

## 2019-05-12 DIAGNOSIS — M25551 Pain in right hip: Secondary | ICD-10-CM | POA: Diagnosis not present

## 2019-05-12 DIAGNOSIS — M150 Primary generalized (osteo)arthritis: Secondary | ICD-10-CM | POA: Diagnosis not present

## 2019-05-27 ENCOUNTER — Other Ambulatory Visit: Payer: Self-pay | Admitting: Internal Medicine

## 2019-06-04 ENCOUNTER — Other Ambulatory Visit: Payer: Self-pay

## 2019-06-04 ENCOUNTER — Other Ambulatory Visit: Payer: BC Managed Care – PPO | Admitting: Internal Medicine

## 2019-06-04 DIAGNOSIS — E786 Lipoprotein deficiency: Secondary | ICD-10-CM

## 2019-06-04 DIAGNOSIS — E781 Pure hyperglyceridemia: Secondary | ICD-10-CM

## 2019-06-04 DIAGNOSIS — I10 Essential (primary) hypertension: Secondary | ICD-10-CM | POA: Diagnosis not present

## 2019-06-04 DIAGNOSIS — Z0000 Encounter for general adult medical examination without abnormal findings: Secondary | ICD-10-CM | POA: Diagnosis not present

## 2019-06-04 DIAGNOSIS — R7302 Impaired glucose tolerance (oral): Secondary | ICD-10-CM | POA: Diagnosis not present

## 2019-06-04 DIAGNOSIS — F17200 Nicotine dependence, unspecified, uncomplicated: Secondary | ICD-10-CM

## 2019-06-05 LAB — CBC WITH DIFFERENTIAL/PLATELET
Absolute Monocytes: 589 cells/uL (ref 200–950)
Basophils Absolute: 31 cells/uL (ref 0–200)
Basophils Relative: 0.5 %
Eosinophils Absolute: 31 cells/uL (ref 15–500)
Eosinophils Relative: 0.5 %
HCT: 47.3 % — ABNORMAL HIGH (ref 35.0–45.0)
Hemoglobin: 15.8 g/dL — ABNORMAL HIGH (ref 11.7–15.5)
Lymphs Abs: 2517 cells/uL (ref 850–3900)
MCH: 32.8 pg (ref 27.0–33.0)
MCHC: 33.4 g/dL (ref 32.0–36.0)
MCV: 98.1 fL (ref 80.0–100.0)
MPV: 9.7 fL (ref 7.5–12.5)
Monocytes Relative: 9.5 %
Neutro Abs: 3032 cells/uL (ref 1500–7800)
Neutrophils Relative %: 48.9 %
Platelets: 256 10*3/uL (ref 140–400)
RBC: 4.82 10*6/uL (ref 3.80–5.10)
RDW: 12.3 % (ref 11.0–15.0)
Total Lymphocyte: 40.6 %
WBC: 6.2 10*3/uL (ref 3.8–10.8)

## 2019-06-05 LAB — COMPLETE METABOLIC PANEL WITH GFR
AG Ratio: 1.6 (calc) (ref 1.0–2.5)
ALT: 27 U/L (ref 6–29)
AST: 23 U/L (ref 10–35)
Albumin: 4.1 g/dL (ref 3.6–5.1)
Alkaline phosphatase (APISO): 74 U/L (ref 37–153)
BUN: 19 mg/dL (ref 7–25)
CO2: 28 mmol/L (ref 20–32)
Calcium: 9.4 mg/dL (ref 8.6–10.4)
Chloride: 105 mmol/L (ref 98–110)
Creat: 0.6 mg/dL (ref 0.50–0.99)
GFR, Est African American: 113 mL/min/{1.73_m2} (ref >=60)
GFR, Est Non African American: 98 mL/min/{1.73_m2} (ref >=60)
Globulin: 2.6 g/dL (calc) (ref 1.9–3.7)
Glucose, Bld: 99 mg/dL (ref 65–99)
Potassium: 4.1 mmol/L (ref 3.5–5.3)
Sodium: 140 mmol/L (ref 135–146)
Total Bilirubin: 0.6 mg/dL (ref 0.2–1.2)
Total Protein: 6.7 g/dL (ref 6.1–8.1)

## 2019-06-05 LAB — LIPID PANEL
Cholesterol: 147 mg/dL (ref <200)
HDL: 38 mg/dL — ABNORMAL LOW (ref >=50)
LDL Cholesterol (Calc): 85 mg/dL (calc)
Non-HDL Cholesterol (Calc): 109 mg/dL (calc) (ref <130)
Total CHOL/HDL Ratio: 3.9 (calc) (ref <5.0)
Triglycerides: 143 mg/dL (ref <150)

## 2019-06-05 LAB — HEMOGLOBIN A1C
Hgb A1c MFr Bld: 5.4 % of total Hgb (ref <5.7)
Mean Plasma Glucose: 108 (calc)
eAG (mmol/L): 6 (calc)

## 2019-06-05 LAB — TSH: TSH: 1.1 mIU/L (ref 0.40–4.50)

## 2019-06-09 ENCOUNTER — Other Ambulatory Visit (HOSPITAL_COMMUNITY)
Admission: RE | Admit: 2019-06-09 | Discharge: 2019-06-09 | Disposition: A | Discharge status: Home / Self Care | Payer: BC Managed Care – PPO | Source: Ambulatory Visit | Attending: Internal Medicine | Admitting: Internal Medicine

## 2019-06-09 ENCOUNTER — Ambulatory Visit: Payer: BC Managed Care – PPO | Admitting: Internal Medicine

## 2019-06-09 ENCOUNTER — Encounter: Payer: Self-pay | Admitting: Internal Medicine

## 2019-06-09 ENCOUNTER — Other Ambulatory Visit: Payer: Self-pay

## 2019-06-09 VITALS — BP 102/60 | HR 71 | Temp 97.9°F | Ht 63.0 in | Wt 213.0 lb

## 2019-06-09 DIAGNOSIS — Z0000 Encounter for general adult medical examination without abnormal findings: Secondary | ICD-10-CM

## 2019-06-09 DIAGNOSIS — I10 Essential (primary) hypertension: Secondary | ICD-10-CM

## 2019-06-09 DIAGNOSIS — Z6837 Body mass index (BMI) 37.0-37.9, adult: Secondary | ICD-10-CM

## 2019-06-09 DIAGNOSIS — F17200 Nicotine dependence, unspecified, uncomplicated: Secondary | ICD-10-CM

## 2019-06-09 DIAGNOSIS — Z124 Encounter for screening for malignant neoplasm of cervix: Secondary | ICD-10-CM

## 2019-06-09 DIAGNOSIS — M48061 Spinal stenosis, lumbar region without neurogenic claudication: Secondary | ICD-10-CM | POA: Diagnosis not present

## 2019-06-09 DIAGNOSIS — M150 Primary generalized (osteo)arthritis: Secondary | ICD-10-CM | POA: Diagnosis not present

## 2019-06-09 DIAGNOSIS — M47817 Spondylosis without myelopathy or radiculopathy, lumbosacral region: Secondary | ICD-10-CM | POA: Diagnosis not present

## 2019-06-09 DIAGNOSIS — Z23 Encounter for immunization: Secondary | ICD-10-CM | POA: Diagnosis not present

## 2019-06-09 DIAGNOSIS — E786 Lipoprotein deficiency: Secondary | ICD-10-CM

## 2019-06-09 DIAGNOSIS — E8881 Metabolic syndrome: Secondary | ICD-10-CM | POA: Diagnosis not present

## 2019-06-09 DIAGNOSIS — R7302 Impaired glucose tolerance (oral): Secondary | ICD-10-CM

## 2019-06-09 DIAGNOSIS — M25551 Pain in right hip: Secondary | ICD-10-CM | POA: Diagnosis not present

## 2019-06-09 LAB — POCT URINALYSIS DIPSTICK
Appearance: NEGATIVE
Bilirubin, UA: NEGATIVE
Blood, UA: NEGATIVE
Glucose, UA: NEGATIVE
Ketones, UA: NEGATIVE
Leukocytes, UA: NEGATIVE
Nitrite, UA: NEGATIVE
Odor: NEGATIVE
Protein, UA: POSITIVE — AB
Spec Grav, UA: 1.01 (ref 1.010–1.025)
Urobilinogen, UA: 0.2 E.U./dL
pH, UA: 6 (ref 5.0–8.0)

## 2019-06-09 NOTE — Progress Notes (Signed)
   Subjective:    Patient ID: Gabriella [name], female    DOB: 08-24-1955, 63 y.o.   MRN: [id number]  HPI 63 year old Female for health maintenance exam  and evaluation of medical issues.  She has a history of impaired glucose tolerance, obesity, osteoarthritis, hyperlipidemia, hypertension, chronic back pain and ankle pain.  She is followed at [facility] pain management for chronic pain.  History of irritable bowel syndrome treated with Bentyl.  Reminded regarding diabetic eye exam.  She now weighs 213 pounds.  BMI is 37.73 but previously weight 250 pounds in October 2017.  Continues to diet.  Not really able to exercise due to musculoskeletal pain.  In December 2017 she had right reverse shoulder arthroplasty by Dr. Onnie Graham.  She saw Dr. Para March at [facility] regarding chronic left ankle pain and foot.  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  Surgery was not advised.  Continues to smoke.  He smoked for well over 30 years.  No alcohol consumption.  Social history: This is her second marriage.  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  They have check inside her and seasonal vegetables.  Her father is not doing well.  He has history of lung cancer.  He apparently has malignancy and she has been going to see him quite a bit trying to help out.  She was hospitalized for perforated duodenal ulcer requiring surgery 2008.  History of debridement of anterior cruciate ligament right knee November 20 at [facility].  Family history: Mother died of complications of dementia with history of severe hypertension.  Father with history of lung cancer.  Total of 5 brothers.  2 brothers have history of heart murmurs.   Review of Systems Has lost 15  pounds since last year. Is working on diet and exercise. Had mammogram in October. New  complaint is mass right shoulder.     Objective:   Physical Exam Vital signs reviewed.  Blood pressure stable at 102/60 BMI 37.73 Skin warm and dry.  Nodes none.  TMs are clear.  Neck is supple without JVD thyromegaly or carotid bruits.  Chest clear to auscultation.  Cardiac exam regular rate and rhythm normal S1 and S2 without murmurs.  Abdomen obese soft nondistended without hepatosplenomegaly masses or tenderness.  Trace lower extremity edema.  Pap taken.  Bimanual normal.  Affect judgment and thought process normal.  Spent time talking about her father and his terminal illness.  She is dealing with that very well.      Assessment & Plan:  I am pleased with her lab work.  She continues to work on diet exercise and weight loss.  She continues with chronic pain management with Dr. Hardin Negus.  Seems to be doing fairly well with that.  Plan: She will follow-up in 6 months and continue current medications.

## 2019-06-12 LAB — CYTOLOGY - PAP
Comment: NEGATIVE
Diagnosis: NEGATIVE
High risk HPV: NEGATIVE

## 2019-06-25 ENCOUNTER — Other Ambulatory Visit: Payer: Self-pay | Admitting: Internal Medicine

## 2019-06-29 NOTE — Patient Instructions (Signed)
Continue current medications.  Continue to work on diet exercise and weight loss.  Continue current medications.  Flu vaccine given today.  Return in 6 months.

## 2019-06-30 ENCOUNTER — Other Ambulatory Visit: Payer: Self-pay | Admitting: Internal Medicine

## 2019-07-07 ENCOUNTER — Other Ambulatory Visit: Payer: Self-pay | Admitting: Anesthesiology

## 2019-07-07 ENCOUNTER — Ambulatory Visit
Admission: RE | Admit: 2019-07-07 | Discharge: 2019-07-07 | Disposition: A | Discharge status: Home / Self Care | Payer: BC Managed Care – PPO | Source: Ambulatory Visit | Attending: Anesthesiology | Admitting: Anesthesiology

## 2019-07-07 DIAGNOSIS — M150 Primary generalized (osteo)arthritis: Secondary | ICD-10-CM | POA: Diagnosis not present

## 2019-07-07 DIAGNOSIS — M25559 Pain in unspecified hip: Secondary | ICD-10-CM

## 2019-07-07 DIAGNOSIS — M160 Bilateral primary osteoarthritis of hip: Secondary | ICD-10-CM | POA: Diagnosis not present

## 2019-07-07 DIAGNOSIS — M47817 Spondylosis without myelopathy or radiculopathy, lumbosacral region: Secondary | ICD-10-CM | POA: Diagnosis not present

## 2019-07-07 DIAGNOSIS — M25551 Pain in right hip: Secondary | ICD-10-CM | POA: Diagnosis not present

## 2019-07-07 DIAGNOSIS — M48061 Spinal stenosis, lumbar region without neurogenic claudication: Secondary | ICD-10-CM | POA: Diagnosis not present

## 2019-07-28 DIAGNOSIS — M47816 Spondylosis without myelopathy or radiculopathy, lumbar region: Secondary | ICD-10-CM | POA: Diagnosis not present

## 2019-08-05 DIAGNOSIS — M47817 Spondylosis without myelopathy or radiculopathy, lumbosacral region: Secondary | ICD-10-CM | POA: Diagnosis not present

## 2019-08-05 DIAGNOSIS — M25551 Pain in right hip: Secondary | ICD-10-CM | POA: Diagnosis not present

## 2019-08-05 DIAGNOSIS — G894 Chronic pain syndrome: Secondary | ICD-10-CM | POA: Diagnosis not present

## 2019-08-05 DIAGNOSIS — M150 Primary generalized (osteo)arthritis: Secondary | ICD-10-CM | POA: Diagnosis not present

## 2019-08-09 ENCOUNTER — Other Ambulatory Visit: Payer: Self-pay | Admitting: Internal Medicine

## 2019-09-09 ENCOUNTER — Other Ambulatory Visit: Payer: Self-pay | Admitting: Internal Medicine

## 2019-09-19 ENCOUNTER — Other Ambulatory Visit: Payer: Self-pay | Admitting: Internal Medicine

## 2019-10-06 ENCOUNTER — Other Ambulatory Visit: Payer: Self-pay | Admitting: Internal Medicine

## 2019-10-06 DIAGNOSIS — M47817 Spondylosis without myelopathy or radiculopathy, lumbosacral region: Secondary | ICD-10-CM | POA: Diagnosis not present

## 2019-10-06 DIAGNOSIS — M25551 Pain in right hip: Secondary | ICD-10-CM | POA: Diagnosis not present

## 2019-10-06 DIAGNOSIS — M150 Primary generalized (osteo)arthritis: Secondary | ICD-10-CM | POA: Diagnosis not present

## 2019-10-06 DIAGNOSIS — G894 Chronic pain syndrome: Secondary | ICD-10-CM | POA: Diagnosis not present

## 2019-11-03 DIAGNOSIS — M150 Primary generalized (osteo)arthritis: Secondary | ICD-10-CM | POA: Diagnosis not present

## 2019-11-03 DIAGNOSIS — G894 Chronic pain syndrome: Secondary | ICD-10-CM | POA: Diagnosis not present

## 2019-11-03 DIAGNOSIS — M25551 Pain in right hip: Secondary | ICD-10-CM | POA: Diagnosis not present

## 2019-11-03 DIAGNOSIS — M47817 Spondylosis without myelopathy or radiculopathy, lumbosacral region: Secondary | ICD-10-CM | POA: Diagnosis not present

## 2019-12-01 ENCOUNTER — Other Ambulatory Visit: Payer: BC Managed Care – PPO | Admitting: Internal Medicine

## 2019-12-01 ENCOUNTER — Other Ambulatory Visit: Payer: Self-pay

## 2019-12-01 DIAGNOSIS — E786 Lipoprotein deficiency: Secondary | ICD-10-CM | POA: Diagnosis not present

## 2019-12-01 DIAGNOSIS — G894 Chronic pain syndrome: Secondary | ICD-10-CM | POA: Diagnosis not present

## 2019-12-01 DIAGNOSIS — M47817 Spondylosis without myelopathy or radiculopathy, lumbosacral region: Secondary | ICD-10-CM | POA: Diagnosis not present

## 2019-12-01 DIAGNOSIS — R7302 Impaired glucose tolerance (oral): Secondary | ICD-10-CM | POA: Diagnosis not present

## 2019-12-01 DIAGNOSIS — M25551 Pain in right hip: Secondary | ICD-10-CM | POA: Diagnosis not present

## 2019-12-01 DIAGNOSIS — M150 Primary generalized (osteo)arthritis: Secondary | ICD-10-CM | POA: Diagnosis not present

## 2019-12-02 ENCOUNTER — Other Ambulatory Visit: Payer: Self-pay | Admitting: Internal Medicine

## 2019-12-02 LAB — HEMOGLOBIN A1C
Hgb A1c MFr Bld: 5.4 % of total Hgb (ref <5.7)
Mean Plasma Glucose: 108 (calc)
eAG (mmol/L): 6 (calc)

## 2019-12-02 LAB — HEPATIC FUNCTION PANEL
AG Ratio: 1.5 (calc) (ref 1.0–2.5)
ALT: 25 U/L (ref 6–29)
AST: 21 U/L (ref 10–35)
Albumin: 4.2 g/dL (ref 3.6–5.1)
Alkaline phosphatase (APISO): 81 U/L (ref 37–153)
Bilirubin, Direct: 0.1 mg/dL (ref 0.0–0.2)
Globulin: 2.8 g/dL (calc) (ref 1.9–3.7)
Indirect Bilirubin: 0.4 mg/dL (calc) (ref 0.2–1.2)
Total Bilirubin: 0.5 mg/dL (ref 0.2–1.2)
Total Protein: 7 g/dL (ref 6.1–8.1)

## 2019-12-02 LAB — LIPID PANEL
Cholesterol: 166 mg/dL (ref <200)
HDL: 40 mg/dL — ABNORMAL LOW (ref >=50)
LDL Cholesterol (Calc): 100 mg/dL (calc) — ABNORMAL HIGH
Non-HDL Cholesterol (Calc): 126 mg/dL (calc) (ref <130)
Total CHOL/HDL Ratio: 4.2 (calc) (ref <5.0)
Triglycerides: 159 mg/dL — ABNORMAL HIGH (ref <150)

## 2019-12-08 ENCOUNTER — Ambulatory Visit: Payer: BC Managed Care – PPO | Admitting: Internal Medicine

## 2019-12-08 ENCOUNTER — Other Ambulatory Visit: Payer: Self-pay

## 2019-12-08 ENCOUNTER — Encounter: Payer: Self-pay | Admitting: Internal Medicine

## 2019-12-08 VITALS — BP 100/60 | HR 100 | Temp 98.0°F | Ht 63.0 in | Wt 212.0 lb

## 2019-12-08 DIAGNOSIS — M79672 Pain in left foot: Secondary | ICD-10-CM

## 2019-12-08 DIAGNOSIS — E8881 Metabolic syndrome: Secondary | ICD-10-CM

## 2019-12-08 DIAGNOSIS — I10 Essential (primary) hypertension: Secondary | ICD-10-CM | POA: Diagnosis not present

## 2019-12-08 DIAGNOSIS — Z8739 Personal history of other diseases of the musculoskeletal system and connective tissue: Secondary | ICD-10-CM

## 2019-12-08 DIAGNOSIS — E785 Hyperlipidemia, unspecified: Secondary | ICD-10-CM

## 2019-12-08 DIAGNOSIS — Z8711 Personal history of peptic ulcer disease: Secondary | ICD-10-CM

## 2019-12-08 DIAGNOSIS — Z6837 Body mass index (BMI) 37.0-37.9, adult: Secondary | ICD-10-CM

## 2019-12-08 DIAGNOSIS — M5416 Radiculopathy, lumbar region: Secondary | ICD-10-CM

## 2019-12-08 DIAGNOSIS — R7302 Impaired glucose tolerance (oral): Secondary | ICD-10-CM

## 2019-12-08 DIAGNOSIS — F17200 Nicotine dependence, unspecified, uncomplicated: Secondary | ICD-10-CM

## 2019-12-08 DIAGNOSIS — K580 Irritable bowel syndrome with diarrhea: Secondary | ICD-10-CM

## 2019-12-08 DIAGNOSIS — E786 Lipoprotein deficiency: Secondary | ICD-10-CM | POA: Diagnosis not present

## 2019-12-08 DIAGNOSIS — Z8659 Personal history of other mental and behavioral disorders: Secondary | ICD-10-CM

## 2019-12-08 NOTE — Progress Notes (Signed)
   Subjective:    Patient ID: Gabriella [name], female    DOB: 11/13/1955, 64 y.o.   MRN: [id number]  HPI 64 year old Female for follow-up on chronic medical issues including chronic back pain and foot pain. History of impaired glucose tolerance, hyperlipidemia, hypertension, metabolic syndrome. History of obesity treated with diet.  Overall is doing very well. Is planning a trip with friends in the near future. Has not had COVID-19 vaccine yet. Patient is not sure she wants to take the vaccine. Discussion trying to encourage her to take vaccine.  Has had both pneumococcal vaccines and tetanus immunization is up-to-date.  Continues to see [facility] for chronic Pain Management for chronic back and foot pain.  Hemoglobin A1c stable at 5.4% and improved from a year ago when it was 5.7%. This is done by diet alone.  Longstanding history of hypertension on multidrug regimen.  Hyperlipidemia treated with Zocor.  Review of Systems no new complaints     Objective:   Physical Exam  Weight 212 pounds. Pulse 100, T 98 pulse 100, blood pressure 100/60, BMI 37.55. In May a year ago she weighed 227 pounds and BMI was 40.21.  Skin warm and dry. No thyromegaly. No carotid bruits. Chest clear to auscultation. Cardiac exam regular rate and rhythm normal S1 and S2 without murmurs or gallops. No lower extremity pitting edema. Affect thought and judgment are within normal limits.      Assessment & Plan:  BMI 37.55-success with diet and exercise program. Continue weight loss efforts.  Chronic pain management-seen by physicians at [facility] and stable. Has chronic foot and back pain.  Essential hypertension stable on current regimen  Hyperlipidemia treated with statin medication and stable. Triglycerides mildly elevated at 159. Has low HDL of 40.  Impaired glucose tolerance treated with diet-hemoglobin A1c stable at 5.4% and a year ago was 5.7%.  History of gout  treated with allopurinol  GE reflux treated with Protonix-also history of peptic ulcer disease requiring surgery. Will need PPI for life.  Anxiety treated with as needed Valium 5 mg every 12 hours  History of smoking-does not want to quit  Plan: Continue current regimen and current efforts with weight loss and exercise and follow-up here in 6 months.

## 2019-12-28 NOTE — Patient Instructions (Signed)
It was a pleasure to see you today. Continue current medications and follow-up in 6 months. Continue diet and weight loss efforts. Labs are stable. Weight loss continues which is good.

## 2019-12-30 DIAGNOSIS — M47817 Spondylosis without myelopathy or radiculopathy, lumbosacral region: Secondary | ICD-10-CM | POA: Diagnosis not present

## 2019-12-30 DIAGNOSIS — G894 Chronic pain syndrome: Secondary | ICD-10-CM | POA: Diagnosis not present

## 2019-12-30 DIAGNOSIS — M25551 Pain in right hip: Secondary | ICD-10-CM | POA: Diagnosis not present

## 2019-12-30 DIAGNOSIS — M150 Primary generalized (osteo)arthritis: Secondary | ICD-10-CM | POA: Diagnosis not present

## 2020-01-11 DIAGNOSIS — M47816 Spondylosis without myelopathy or radiculopathy, lumbar region: Secondary | ICD-10-CM | POA: Diagnosis not present

## 2020-01-28 DIAGNOSIS — M150 Primary generalized (osteo)arthritis: Secondary | ICD-10-CM | POA: Diagnosis not present

## 2020-01-28 DIAGNOSIS — M47817 Spondylosis without myelopathy or radiculopathy, lumbosacral region: Secondary | ICD-10-CM | POA: Diagnosis not present

## 2020-01-28 DIAGNOSIS — M25551 Pain in right hip: Secondary | ICD-10-CM | POA: Diagnosis not present

## 2020-01-28 DIAGNOSIS — G894 Chronic pain syndrome: Secondary | ICD-10-CM | POA: Diagnosis not present

## 2020-03-01 ENCOUNTER — Other Ambulatory Visit: Payer: Self-pay | Admitting: Internal Medicine

## 2020-03-02 ENCOUNTER — Telehealth: Payer: Self-pay | Admitting: Internal Medicine

## 2020-03-02 ENCOUNTER — Other Ambulatory Visit: Payer: Self-pay | Admitting: Internal Medicine

## 2020-03-02 DIAGNOSIS — M150 Primary generalized (osteo)arthritis: Secondary | ICD-10-CM | POA: Diagnosis not present

## 2020-03-02 DIAGNOSIS — M25551 Pain in right hip: Secondary | ICD-10-CM | POA: Diagnosis not present

## 2020-03-02 DIAGNOSIS — G894 Chronic pain syndrome: Secondary | ICD-10-CM | POA: Diagnosis not present

## 2020-03-02 DIAGNOSIS — M47817 Spondylosis without myelopathy or radiculopathy, lumbosacral region: Secondary | ICD-10-CM | POA: Diagnosis not present

## 2020-03-02 MED ORDER — OLMESARTAN MEDOXOMIL 40 MG PO TABS: 40.0000 mg | ORAL_TABLET | Freq: Every day | ORAL | 1 refills | Status: DC

## 2020-03-02 NOTE — Telephone Encounter (Signed)
Received Fax RX request from  Pharmacy -   [id number] - [place], [address] Phone:  [phone]  Fax:  [phone]       Medication - olmesartan medoxmil 40mg  tab  Last Refill - 5/14/221  Last OV - 12/08/2019  Last CPE - 06/09/2019  Next Appointment - 06/09/2020

## 2020-03-02 NOTE — Telephone Encounter (Signed)
Refill until next OV

## 2020-03-15 ENCOUNTER — Telehealth: Payer: Self-pay | Admitting: Internal Medicine

## 2020-03-15 MED ORDER — AMLODIPINE BESYLATE 10 MG PO TABS: ORAL_TABLET | ORAL | 3 refills | Status: DC

## 2020-03-15 NOTE — Telephone Encounter (Signed)
Refill amlodipine for one year to pharmacy

## 2020-03-15 NOTE — Telephone Encounter (Signed)
Received Fax RX request from  Pharmacy -  [id number] - [place], [place] Phone:  [phone]  Fax:  [phone]       Medication - amLODipine (NORVASC) 10 MG tablet   Last Refill - 12/20/19  Last OV - 12/08/19  Last CPE - 06/09/19  Next Appointment - 06/09/20

## 2020-03-31 DIAGNOSIS — M47817 Spondylosis without myelopathy or radiculopathy, lumbosacral region: Secondary | ICD-10-CM | POA: Diagnosis not present

## 2020-03-31 DIAGNOSIS — M150 Primary generalized (osteo)arthritis: Secondary | ICD-10-CM | POA: Diagnosis not present

## 2020-03-31 DIAGNOSIS — G894 Chronic pain syndrome: Secondary | ICD-10-CM | POA: Diagnosis not present

## 2020-03-31 DIAGNOSIS — M25551 Pain in right hip: Secondary | ICD-10-CM | POA: Diagnosis not present

## 2020-04-28 DIAGNOSIS — M150 Primary generalized (osteo)arthritis: Secondary | ICD-10-CM | POA: Diagnosis not present

## 2020-04-28 DIAGNOSIS — M47817 Spondylosis without myelopathy or radiculopathy, lumbosacral region: Secondary | ICD-10-CM | POA: Diagnosis not present

## 2020-04-28 DIAGNOSIS — M25551 Pain in right hip: Secondary | ICD-10-CM | POA: Diagnosis not present

## 2020-04-28 DIAGNOSIS — G894 Chronic pain syndrome: Secondary | ICD-10-CM | POA: Diagnosis not present

## 2020-04-28 DIAGNOSIS — Z79891 Long term (current) use of opiate analgesic: Secondary | ICD-10-CM | POA: Diagnosis not present

## 2020-04-29 ENCOUNTER — Other Ambulatory Visit: Payer: Self-pay | Admitting: Internal Medicine

## 2020-05-26 DIAGNOSIS — M47817 Spondylosis without myelopathy or radiculopathy, lumbosacral region: Secondary | ICD-10-CM | POA: Diagnosis not present

## 2020-05-26 DIAGNOSIS — M25551 Pain in right hip: Secondary | ICD-10-CM | POA: Diagnosis not present

## 2020-05-26 DIAGNOSIS — G894 Chronic pain syndrome: Secondary | ICD-10-CM | POA: Diagnosis not present

## 2020-05-26 DIAGNOSIS — M150 Primary generalized (osteo)arthritis: Secondary | ICD-10-CM | POA: Diagnosis not present

## 2020-05-31 ENCOUNTER — Other Ambulatory Visit: Payer: Self-pay | Admitting: Internal Medicine

## 2020-05-31 DIAGNOSIS — Z1231 Encounter for screening mammogram for malignant neoplasm of breast: Secondary | ICD-10-CM

## 2020-06-03 ENCOUNTER — Other Ambulatory Visit: Payer: Self-pay

## 2020-06-03 ENCOUNTER — Other Ambulatory Visit: Payer: BC Managed Care – PPO | Admitting: Internal Medicine

## 2020-06-03 DIAGNOSIS — I10 Essential (primary) hypertension: Secondary | ICD-10-CM

## 2020-06-03 DIAGNOSIS — R7302 Impaired glucose tolerance (oral): Secondary | ICD-10-CM

## 2020-06-03 DIAGNOSIS — Z0000 Encounter for general adult medical examination without abnormal findings: Secondary | ICD-10-CM

## 2020-06-03 DIAGNOSIS — E786 Lipoprotein deficiency: Secondary | ICD-10-CM

## 2020-06-03 DIAGNOSIS — K580 Irritable bowel syndrome with diarrhea: Secondary | ICD-10-CM

## 2020-06-03 DIAGNOSIS — Z6837 Body mass index (BMI) 37.0-37.9, adult: Secondary | ICD-10-CM

## 2020-06-03 DIAGNOSIS — E8881 Metabolic syndrome: Secondary | ICD-10-CM

## 2020-06-03 DIAGNOSIS — E785 Hyperlipidemia, unspecified: Secondary | ICD-10-CM

## 2020-06-06 ENCOUNTER — Other Ambulatory Visit: Payer: BC Managed Care – PPO | Admitting: Internal Medicine

## 2020-06-08 ENCOUNTER — Other Ambulatory Visit: Payer: Self-pay | Admitting: Internal Medicine

## 2020-06-09 ENCOUNTER — Other Ambulatory Visit (HOSPITAL_COMMUNITY)
Admission: RE | Admit: 2020-06-09 | Discharge: 2020-06-09 | Disposition: A | Discharge status: Home / Self Care | Payer: BC Managed Care – PPO | Source: Ambulatory Visit | Attending: Internal Medicine | Admitting: Internal Medicine

## 2020-06-09 ENCOUNTER — Ambulatory Visit (INDEPENDENT_AMBULATORY_CARE_PROVIDER_SITE_OTHER): Payer: BC Managed Care – PPO | Admitting: Internal Medicine

## 2020-06-09 ENCOUNTER — Other Ambulatory Visit: Payer: Self-pay

## 2020-06-09 ENCOUNTER — Encounter: Payer: Self-pay | Admitting: Internal Medicine

## 2020-06-09 VITALS — BP 110/70 | HR 70 | Ht 63.0 in | Wt 209.0 lb

## 2020-06-09 DIAGNOSIS — E786 Lipoprotein deficiency: Secondary | ICD-10-CM

## 2020-06-09 DIAGNOSIS — Z8739 Personal history of other diseases of the musculoskeletal system and connective tissue: Secondary | ICD-10-CM

## 2020-06-09 DIAGNOSIS — K580 Irritable bowel syndrome with diarrhea: Secondary | ICD-10-CM | POA: Diagnosis not present

## 2020-06-09 DIAGNOSIS — Z8711 Personal history of peptic ulcer disease: Secondary | ICD-10-CM | POA: Diagnosis not present

## 2020-06-09 DIAGNOSIS — Z6837 Body mass index (BMI) 37.0-37.9, adult: Secondary | ICD-10-CM

## 2020-06-09 DIAGNOSIS — Z23 Encounter for immunization: Secondary | ICD-10-CM

## 2020-06-09 DIAGNOSIS — Z124 Encounter for screening for malignant neoplasm of cervix: Secondary | ICD-10-CM | POA: Diagnosis not present

## 2020-06-09 DIAGNOSIS — Z0000 Encounter for general adult medical examination without abnormal findings: Secondary | ICD-10-CM | POA: Diagnosis not present

## 2020-06-09 DIAGNOSIS — N898 Other specified noninflammatory disorders of vagina: Secondary | ICD-10-CM

## 2020-06-09 DIAGNOSIS — I10 Essential (primary) hypertension: Secondary | ICD-10-CM

## 2020-06-09 DIAGNOSIS — M7918 Myalgia, other site: Secondary | ICD-10-CM

## 2020-06-09 DIAGNOSIS — Z8659 Personal history of other mental and behavioral disorders: Secondary | ICD-10-CM

## 2020-06-09 DIAGNOSIS — E8881 Metabolic syndrome: Secondary | ICD-10-CM

## 2020-06-09 DIAGNOSIS — R7302 Impaired glucose tolerance (oral): Secondary | ICD-10-CM | POA: Diagnosis not present

## 2020-06-09 DIAGNOSIS — E781 Pure hyperglyceridemia: Secondary | ICD-10-CM

## 2020-06-09 DIAGNOSIS — G8929 Other chronic pain: Secondary | ICD-10-CM

## 2020-06-09 LAB — POCT URINALYSIS DIPSTICK
Appearance: NEGATIVE
Bilirubin, UA: NEGATIVE
Blood, UA: NEGATIVE
Glucose, UA: NEGATIVE
Ketones, UA: NEGATIVE
Leukocytes, UA: NEGATIVE
Nitrite, UA: NEGATIVE
Odor: NEGATIVE
Protein, UA: POSITIVE — AB
Spec Grav, UA: 1.01 (ref 1.010–1.025)
Urobilinogen, UA: 0.2 E.U./dL
pH, UA: 6.5 (ref 5.0–8.0)

## 2020-06-09 LAB — POCT WET PREP (WET MOUNT): WBC, Wet Prep HPF POC: POSITIVE

## 2020-06-09 MED ORDER — METRONIDAZOLE 500 MG PO TABS: 500.0000 mg | ORAL_TABLET | Freq: Two times a day (BID) | ORAL | 0 refills | Status: DC

## 2020-06-09 NOTE — Progress Notes (Signed)
Subjective:    Patient ID: Gabriella [name], female    DOB: February 09, 1956, 64 y.o.   MRN: [id number]  HPI 64 year old Female seen for health maintenance exam and evaluation of medical issues.  She has a history of impaired glucose tolerance, obesity, osteoarthritis, hyperlipidemia, hypertension, chronic back pain and ankle pain.  She is followed at [facility] pain management for chronic pain.  History of irritable bowel syndrome treated with Bentyl.  Reminded regarding diabetic eye exam.  Not able to really exercise much due to musculoskeletal pain.  In December 2017 she had right reverse shoulder arthroplasty by Dr. Onnie Graham.  She saw Dr. Para March at [facility] regarding chronic left ankle pain and foot.  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  Surgery was not advised.  Continues to smoke and has smoked for well over 30 years.  No alcohol consumption.  Social history: This is her second marriage.  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  They have chickens and seasonal vegetables.  She was hospitalized for perforated duodenal ulcer requiring surgery 2008.  History of debridement of anterior cruciate ligament right knee at [facility].  Family history: Mother died with complications of dementia with history of severe hypertension.  Father deceased with lung cancer.  Time of 5 brothers.  2 brothers have heart murmurs.    Review of Systems has had LLQ pain for 1 day or so and afterward vaginal discharge foul smelling began after that.  Concerned about prominence right scapula and nodule left lower rib cage area-these are benign  Taking lysine and keratin   Has lost 4 more pounds with diet and light exercise.       Objective:   Physical Exam Blood pressure 110/70 pulse 70 pulse oximetry 97% weight 209 pounds height 5  feet 3 inches BMI 37.02  Skin warm and dry.  No cervical adenopathy.  No carotid bruits.  No thyromegaly.  Chest is clear to auscultation.  Cardiac exam regular rate and rhythm normal S1 and S2 without murmurs or gallops.  Abdomen soft nondistended without hepatosplenomegaly masses or tenderness.  She has a watery vaginal discharge.  Wet prep positive for white cells and clue cells.  Pap showed trichomonas vaginalis.  Patient was treated with Flagyl.  Husband also needs to be treated.  No masses on bimanual exam.  No lower extremity pitting edema.  Neuro intact without gross focal deficits.  Affect thought and judgment are normal.  Mass on right shoulder appears to be a lipoma.       Assessment & Plan:  Trichomonas vaginalis and bacterial vaginosis treated with Flagyl.  Husband needs to be treated as well.  We will send a prescription in for him.  Chronic pain treated [facility]  Not vaccinated for COVID-19-encouraged her to do so   Flu vaccine given  Glucose intolerance hemoglobin A1c 5.4% with diet  Hypertriglyceridemia with triglycerides 171-is on Zocor 20 mg at bedtime.  GE reflux treated with PPI  Peripheral neuropathy treated with Neurontin-idiopathic  History of gout treated with allopurinol  Anxiety treated with Valium on a as needed basis sparingly.  History of irritable bowel syndrome  History of peptic ulcer disease with repair of peptic ulcer perforation 2008  Has low HDL of 40  Essential hypertension-stable on current regimen  Mass on right shoulder thought to  be lipoma.

## 2020-06-10 LAB — MICROALBUMIN / CREATININE URINE RATIO
Creatinine, Urine: 116 mg/dL (ref 20–275)
Microalb Creat Ratio: 99 mcg/mg creat — ABNORMAL HIGH (ref <30)
Microalb, Ur: 11.5 mg/dL

## 2020-06-11 LAB — CBC WITH DIFFERENTIAL/PLATELET
Absolute Monocytes: 602 cells/uL (ref 200–950)
Basophils Absolute: 32 cells/uL (ref 0–200)
Basophils Relative: 0.5 %
Eosinophils Absolute: 38 cells/uL (ref 15–500)
Eosinophils Relative: 0.6 %
HCT: 48.3 % — ABNORMAL HIGH (ref 35.0–45.0)
Hemoglobin: 16.3 g/dL — ABNORMAL HIGH (ref 11.7–15.5)
Lymphs Abs: 2586 cells/uL (ref 850–3900)
MCH: 33.1 pg — ABNORMAL HIGH (ref 27.0–33.0)
MCHC: 33.7 g/dL (ref 32.0–36.0)
MCV: 98.2 fL (ref 80.0–100.0)
MPV: 9.6 fL (ref 7.5–12.5)
Monocytes Relative: 9.4 %
Neutro Abs: 3142 cells/uL (ref 1500–7800)
Neutrophils Relative %: 49.1 %
Platelets: 262 10*3/uL (ref 140–400)
RBC: 4.92 10*6/uL (ref 3.80–5.10)
RDW: 12.8 % (ref 11.0–15.0)
Total Lymphocyte: 40.4 %
WBC: 6.4 10*3/uL (ref 3.8–10.8)

## 2020-06-11 LAB — COMPLETE METABOLIC PANEL WITH GFR
AG Ratio: 1.4 (calc) (ref 1.0–2.5)
ALT: 28 U/L (ref 6–29)
AST: 23 U/L (ref 10–35)
Albumin: 4.2 g/dL (ref 3.6–5.1)
Alkaline phosphatase (APISO): 69 U/L (ref 37–153)
BUN: 21 mg/dL (ref 7–25)
CO2: 32 mmol/L (ref 20–32)
Calcium: 10.1 mg/dL (ref 8.6–10.4)
Chloride: 104 mmol/L (ref 98–110)
Creat: 0.52 mg/dL (ref 0.50–0.99)
GFR, Est African American: 118 mL/min/{1.73_m2} (ref >=60)
GFR, Est Non African American: 102 mL/min/{1.73_m2} (ref >=60)
Globulin: 3 g/dL (calc) (ref 1.9–3.7)
Glucose, Bld: 81 mg/dL (ref 65–99)
Potassium: 4.4 mmol/L (ref 3.5–5.3)
Sodium: 142 mmol/L (ref 135–146)
Total Bilirubin: 0.4 mg/dL (ref 0.2–1.2)
Total Protein: 7.2 g/dL (ref 6.1–8.1)

## 2020-06-11 LAB — TSH: TSH: 1.41 mIU/L (ref 0.40–4.50)

## 2020-06-11 LAB — HEMOGLOBIN A1C W/OUT EAG: Hgb A1c MFr Bld: 5.4 % of total Hgb (ref <5.7)

## 2020-06-11 LAB — TEST AUTHORIZATION

## 2020-06-11 LAB — LIPID PANEL
Cholesterol: 159 mg/dL (ref <200)
HDL: 40 mg/dL — ABNORMAL LOW (ref >=50)
LDL Cholesterol (Calc): 92 mg/dL (calc)
Non-HDL Cholesterol (Calc): 119 mg/dL (calc) (ref <130)
Total CHOL/HDL Ratio: 4 (calc) (ref <5.0)
Triglycerides: 171 mg/dL — ABNORMAL HIGH (ref <150)

## 2020-06-14 LAB — CYTOLOGY - PAP
Comment: NEGATIVE
Diagnosis: NEGATIVE
High risk HPV: NEGATIVE

## 2020-06-20 ENCOUNTER — Encounter: Payer: Self-pay | Admitting: Internal Medicine

## 2020-06-20 ENCOUNTER — Ambulatory Visit (INDEPENDENT_AMBULATORY_CARE_PROVIDER_SITE_OTHER): Payer: BC Managed Care – PPO | Admitting: Internal Medicine

## 2020-06-20 ENCOUNTER — Other Ambulatory Visit: Payer: Self-pay

## 2020-06-20 VITALS — BP 130/80 | HR 88 | Temp 97.9°F | Ht 63.0 in | Wt 209.0 lb

## 2020-06-20 DIAGNOSIS — A5901 Trichomonal vulvovaginitis: Secondary | ICD-10-CM | POA: Diagnosis not present

## 2020-06-20 DIAGNOSIS — R87619 Unspecified abnormal cytological findings in specimens from cervix uteri: Secondary | ICD-10-CM | POA: Diagnosis not present

## 2020-06-20 NOTE — Progress Notes (Signed)
   Subjective:    Patient ID: Gabriella [name], female    DOB: [birth year], 64 y.o.   MRN: [id number]  HPI 64 year old Female seen at my request as found to have trichomonas vaginalis on Pap smear November 11.  Patient had complained of vaginal discharge at time of appointment on November 11.  Wet prep showed clue cells and she was treated with Flagyl 500 mg daily for 7 days.  In the interim we received her Pap smear report and asked her to return regarding discussion of trichomonas vaginalis.  She does not think husband has been unfaithful.  She has not been unfaithful.  She got better after taking Flagyl.  However they have engaged in intercourse since that time so she possibly has been reexposed through her husband.  She says he has never been told he has an STD but does not have a current primary care physician.  Has been to [facility] in the past.    Review of Systems see above     Objective:   Physical Exam Blood pressure 130/80 pulse 88 temperature 97.9 degrees pulse oximetry 96% weight 209 pounds height 5 feet 3 inches       Assessment & Plan:  Discussion regarding trichomonads vaginitis-how it is transmitted and importance of treating both female and female partner at same time.  She has had discussion with her husband who denies being unfaithful.  However he has no primary care physician at present time.  I have written him Jenny Reichmann Flesch) whom I have met previously, a prescription for Flagyl 500 mg twice daily for 7 days.  Advised not to drink alcohol while consuming Flagyl.  Patient will take another 500 mg twice daily for 7 days course of treatment at the same time followed by vinegar and water douche.

## 2020-06-20 NOTE — Patient Instructions (Signed)
Patient and husband are to take Flagyl 500 mg twice daily for 7 days each.  Patient will use vinegar and water douche after treatment.

## 2020-06-21 LAB — HEPATITIS C ANTIBODY
Hepatitis C Ab: NONREACTIVE
SIGNAL TO CUT-OFF: 0.03 (ref <1.00)

## 2020-06-21 LAB — HIV ANTIBODY (ROUTINE TESTING W REFLEX): HIV 1&2 Ab, 4th Generation: NONREACTIVE

## 2020-06-22 ENCOUNTER — Other Ambulatory Visit: Payer: Self-pay | Admitting: Internal Medicine

## 2020-06-22 NOTE — Telephone Encounter (Signed)
Please call her and pharmacy. Why are they sending this refill? Did we not put it in the other day?

## 2020-06-22 NOTE — Telephone Encounter (Signed)
She said you wanted her to take another round of this but it was not sent to her pharmacy.

## 2020-06-27 DIAGNOSIS — G894 Chronic pain syndrome: Secondary | ICD-10-CM | POA: Diagnosis not present

## 2020-06-27 DIAGNOSIS — M47817 Spondylosis without myelopathy or radiculopathy, lumbosacral region: Secondary | ICD-10-CM | POA: Diagnosis not present

## 2020-06-27 DIAGNOSIS — M150 Primary generalized (osteo)arthritis: Secondary | ICD-10-CM | POA: Diagnosis not present

## 2020-06-27 DIAGNOSIS — M25551 Pain in right hip: Secondary | ICD-10-CM | POA: Diagnosis not present

## 2020-07-07 ENCOUNTER — Other Ambulatory Visit: Payer: Self-pay

## 2020-07-07 ENCOUNTER — Ambulatory Visit
Admission: RE | Admit: 2020-07-07 | Discharge: 2020-07-07 | Disposition: A | Discharge status: Home / Self Care | Payer: BC Managed Care – PPO | Source: Ambulatory Visit | Attending: Internal Medicine | Admitting: Internal Medicine

## 2020-07-07 DIAGNOSIS — Z1231 Encounter for screening mammogram for malignant neoplasm of breast: Secondary | ICD-10-CM | POA: Diagnosis not present

## 2020-07-25 ENCOUNTER — Other Ambulatory Visit: Payer: Self-pay | Admitting: Internal Medicine

## 2020-07-25 DIAGNOSIS — G894 Chronic pain syndrome: Secondary | ICD-10-CM | POA: Diagnosis not present

## 2020-07-25 DIAGNOSIS — M47817 Spondylosis without myelopathy or radiculopathy, lumbosacral region: Secondary | ICD-10-CM | POA: Diagnosis not present

## 2020-07-25 DIAGNOSIS — M150 Primary generalized (osteo)arthritis: Secondary | ICD-10-CM | POA: Diagnosis not present

## 2020-07-25 DIAGNOSIS — M25551 Pain in right hip: Secondary | ICD-10-CM | POA: Diagnosis not present

## 2020-07-29 ENCOUNTER — Encounter: Payer: Self-pay | Admitting: Internal Medicine

## 2020-07-29 NOTE — Patient Instructions (Addendum)
Please consider Covid vaccine.  Flu vaccine given.  Take Flagyl for trichomonads and bacterial vaginitis.  Use vinegar and water douche after completing Flagyl.  Prescription written for your husband to take at the same time.  Continue current medications.  Return in 6 months

## 2020-08-24 ENCOUNTER — Telehealth: Payer: Self-pay | Admitting: Internal Medicine

## 2020-08-24 DIAGNOSIS — M25551 Pain in right hip: Secondary | ICD-10-CM | POA: Diagnosis not present

## 2020-08-24 DIAGNOSIS — M47817 Spondylosis without myelopathy or radiculopathy, lumbosacral region: Secondary | ICD-10-CM | POA: Diagnosis not present

## 2020-08-24 DIAGNOSIS — M150 Primary generalized (osteo)arthritis: Secondary | ICD-10-CM | POA: Diagnosis not present

## 2020-08-24 DIAGNOSIS — G894 Chronic pain syndrome: Secondary | ICD-10-CM | POA: Diagnosis not present

## 2020-08-24 NOTE — Telephone Encounter (Signed)
Scheduled

## 2020-08-24 NOTE — Telephone Encounter (Signed)
Gabriella [name] [phone]  Today [name] went to see Dr Hardin Negus her pain management doctor and he wanted her to tell you about a spell and fall she had yesterday.  Yesterday around 5:00 pm she was setting on the commode and was feeling wobble, she threw up once and when she went to get up was still feeling wobble she went to reach for the sink counter top and missed and went to the floor, she did not hit her head, she scratched her arm, it bleed a little. Crawled back to the commode for support to stand them she held on the things to get to her recliner, threw up one more time, she waited for a couple of hours then took a shower, then she felt better. Feels fine today. Dr Hardin Negus felt like you should know incase she has another spell, he also put her on aspirin 2 for 2 days and then 1 a day.

## 2020-08-24 NOTE — Telephone Encounter (Signed)
We can see her next Thursday Feb 3

## 2020-08-30 ENCOUNTER — Other Ambulatory Visit: Payer: Self-pay

## 2020-08-30 ENCOUNTER — Encounter: Payer: Self-pay | Admitting: Internal Medicine

## 2020-08-30 ENCOUNTER — Ambulatory Visit (INDEPENDENT_AMBULATORY_CARE_PROVIDER_SITE_OTHER): Payer: BC Managed Care – PPO | Admitting: Internal Medicine

## 2020-08-30 VITALS — BP 110/70 | HR 70 | Temp 97.7°F | Ht 63.0 in | Wt 212.0 lb

## 2020-08-30 DIAGNOSIS — W19XXXA Unspecified fall, initial encounter: Secondary | ICD-10-CM

## 2020-08-30 DIAGNOSIS — I10 Essential (primary) hypertension: Secondary | ICD-10-CM

## 2020-08-30 DIAGNOSIS — R7302 Impaired glucose tolerance (oral): Secondary | ICD-10-CM

## 2020-08-30 DIAGNOSIS — G459 Transient cerebral ischemic attack, unspecified: Secondary | ICD-10-CM | POA: Diagnosis not present

## 2020-08-30 DIAGNOSIS — E781 Pure hyperglyceridemia: Secondary | ICD-10-CM | POA: Diagnosis not present

## 2020-08-30 NOTE — Progress Notes (Signed)
Subjective:    Patient ID: Gabriella [name], female    DOB: Aug 08, 1955, 65 y.o.   MRN: [id number]  HPI 65 year old Female was in her home on January 25. She acutely had weakness in her neck area and could not hold her head up. She got up out of chair went to bathroom, vomited once, had BM. Got up off commode and reached for a sink countertop, was weak and went down on the floor. Did not strike head. Eventually made it back to recliner in another room. Vomited once more. Waited for a couple of hours, took shower and felt better. Denied chest pain or SOB.She is a smoker. No hx of stroke or TIA.  Longstanding history of hypertension and diabetes.  Sees Dr. Hardin Negus for chronic pain management.  She saw him recently and he advised her to start taking aspirin.  He also asked that she contact me.  She did not seek medical attention on the day of the episode.  Extensive labs were drawn today including CBC, c-Met, hemoglobin A1c TSH sed rate magnesium and free T4.  Hemoglobin A1c is 5.5%.  Hemoglobin 15.4 g.  Generally runs 15.8 to 16.6 g.  White blood cell count is normal.  EKG is unchanged from previous EKG in 2017.  An EKG was done in connection with right shoulder arthroscopic surgery by Dr. Tommy Rainwater shoulder arthroplasty in December 2017.  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  EKG today shows no change from EKG in 2017.    Review of Systems says she feels fine today.  No chest pain, shortness of breath, weakness.     Objective:   Physical Exam Blood pressure 102/66.  PERRLA.  Extraocular movements are full.  No nystagmus.  Cranial nerves II through XII are grossly intact.  Muscle strength is normal in the upper and lower extremities.  No carotid bruits.  Chest is clear to auscultation.  Cardiac exam: Regular rate and rhythm without murmurs or ectopy.  She is alert and oriented x3 and is in no acute distress today.  She has gait  disturbance due to foot issues.  Does not seem to be able to place feet flat on floor.  Walks with feet turned inward.  Labs drawn and are pending  EKG shows no change from EKG previously in 2017  She was seen here for health maintenance exam June 09, 2020.  She has a history of osteoarthritis, hyperlipidemia, impaired glucose tolerance hypertension, chronic back and ankle pain.  Medications include: allopurinol, amlodipine, Lasix 40 mg daily, gabapentin Norco for chronic back and ankle, metoprolol, Benicar, Protonix, simvastatin  Social history: She is married.  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  Is smoked for well over 30 years.  No alcohol consumption.  Family history: Mother died of complications of dementia with severe hypertension.  Father deceased with lung cancer.  Total of 5 brothers.  2 brothers have history of heart murmurs.  She was hospitalized for perforated duodenal ulcer requiring surgery in 2008.  History of debridement of anterior cruciate ligament right knee.     Assessment & Plan:  ?  TIA  ?  Arrhythmia  Plan: She has risk factors for cerebrovascular and cardiovascular disease with history of diabetes and smoking.  It is not clear to me whether this was a neurological event or an arrhythmia but I favor a neurological event.  Plan: She will be referred urgently to Neurology.  We  will also place cardiology referral.  Addendum: Labs are within normal limits.  She is to seek immediate medical attention should these symptoms recur.

## 2020-08-31 ENCOUNTER — Encounter: Payer: Self-pay | Admitting: Internal Medicine

## 2020-08-31 LAB — CBC WITH DIFFERENTIAL/PLATELET
Absolute Monocytes: 537 cells/uL (ref 200–950)
Basophils Absolute: 18 cells/uL (ref 0–200)
Basophils Relative: 0.3 %
Eosinophils Absolute: 12 cells/uL — ABNORMAL LOW (ref 15–500)
Eosinophils Relative: 0.2 %
HCT: 46 % — ABNORMAL HIGH (ref 35.0–45.0)
Hemoglobin: 15.4 g/dL (ref 11.7–15.5)
Lymphs Abs: 2549 cells/uL (ref 850–3900)
MCH: 33.2 pg — ABNORMAL HIGH (ref 27.0–33.0)
MCHC: 33.5 g/dL (ref 32.0–36.0)
MCV: 99.1 fL (ref 80.0–100.0)
MPV: 10 fL (ref 7.5–12.5)
Monocytes Relative: 9.1 %
Neutro Abs: 2785 cells/uL (ref 1500–7800)
Neutrophils Relative %: 47.2 %
Platelets: 264 10*3/uL (ref 140–400)
RBC: 4.64 10*6/uL (ref 3.80–5.10)
RDW: 12.8 % (ref 11.0–15.0)
Total Lymphocyte: 43.2 %
WBC: 5.9 10*3/uL (ref 3.8–10.8)

## 2020-08-31 LAB — COMPLETE METABOLIC PANEL WITH GFR
AG Ratio: 1.5 (calc) (ref 1.0–2.5)
ALT: 25 U/L (ref 6–29)
AST: 27 U/L (ref 10–35)
Albumin: 4.1 g/dL (ref 3.6–5.1)
Alkaline phosphatase (APISO): 70 U/L (ref 37–153)
BUN: 23 mg/dL (ref 7–25)
CO2: 28 mmol/L (ref 20–32)
Calcium: 9.3 mg/dL (ref 8.6–10.4)
Chloride: 103 mmol/L (ref 98–110)
Creat: 0.54 mg/dL (ref 0.50–0.99)
GFR, Est African American: 116 mL/min/{1.73_m2} (ref >=60)
GFR, Est Non African American: 100 mL/min/{1.73_m2} (ref >=60)
Globulin: 2.8 g/dL (calc) (ref 1.9–3.7)
Glucose, Bld: 84 mg/dL (ref 65–99)
Potassium: 4.1 mmol/L (ref 3.5–5.3)
Sodium: 141 mmol/L (ref 135–146)
Total Bilirubin: 0.5 mg/dL (ref 0.2–1.2)
Total Protein: 6.9 g/dL (ref 6.1–8.1)

## 2020-08-31 LAB — SEDIMENTATION RATE: Sed Rate: 11 mm/h (ref 0–30)

## 2020-08-31 LAB — HEMOGLOBIN A1C
Hgb A1c MFr Bld: 5.5 % of total Hgb (ref <5.7)
Mean Plasma Glucose: 111 mg/dL
eAG (mmol/L): 6.2 mmol/L

## 2020-08-31 LAB — T4, FREE: Free T4: 1.2 ng/dL (ref 0.8–1.8)

## 2020-08-31 LAB — MAGNESIUM: Magnesium: 2 mg/dL (ref 1.5–2.5)

## 2020-08-31 LAB — TSH: TSH: 1.73 mIU/L (ref 0.40–4.50)

## 2020-08-31 NOTE — Patient Instructions (Addendum)
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  Labs are within normal limits.  Making referral urgently to neurology to rule out TIA/CVA and will also make referral to Cardiology.  If symptoms return, she is to seek medical attention immediately at the emergency department.

## 2020-09-05 ENCOUNTER — Other Ambulatory Visit: Payer: Self-pay

## 2020-09-05 ENCOUNTER — Ambulatory Visit: Payer: BC Managed Care – PPO | Admitting: Neurology

## 2020-09-05 ENCOUNTER — Encounter: Payer: Self-pay | Admitting: Neurology

## 2020-09-05 VITALS — BP 129/76 | HR 68 | Ht 63.0 in | Wt 214.5 lb

## 2020-09-05 DIAGNOSIS — R269 Unspecified abnormalities of gait and mobility: Secondary | ICD-10-CM | POA: Diagnosis not present

## 2020-09-05 DIAGNOSIS — G459 Transient cerebral ischemic attack, unspecified: Secondary | ICD-10-CM | POA: Insufficient documentation

## 2020-09-05 NOTE — Progress Notes (Signed)
Chief Complaint  Patient presents with   New Patient (Initial Visit)    Referred to rule out TIA or CVA. Two weeks ago, she has an episode of dizziness, nauseas, vomiting and forward leaning, wobbly gait. She also fell in her hallway and twisted her left knee. Symptoms last about 20 minutes. She felt tired after the event and rested in her recliner the remainder of the day.    HISTORICAL  Gabriella [name] is a 64 year old female, seen in request by her primary care doctor Tedra Senegal for evaluation of sudden onset dizziness, nausea, unsteady gait, initial evaluation was on September 05, 2020.  I reviewed and summarized the referring note.  Past medical history Hypertension Hyperlipidemia Gout Depression, anxiety, Valium 5 mg 1 to 2 tablets each day, Chronic pain, on Norco 10/325 mg 6 tablets each day, gabapentin 800 mg 4 times a day, Smoker, 1ppd.  On August 22, 2020, around 4:30 PM, after feeding her cat, walking back home, she suddenly felt dizzy, her head was so heavy, kept her body going forward, she has to hold onto walls to get back to her room, then fell backwards on the floor, twisted her left knee, difficulty getting up, she has to crawl up to her bathroom, she denied loss of consciousness, after using bathroom, she continue to feel tired, weak, nauseous, was able to manage to her recliner, when her husband came back 30 minutes later, she was noted to have mild elevated blood pressure 160/110th, she denied chest pain, denies difficulty talking, she felt weak rest of the evening, but began to gradually improve  Now she is almost back to herself, has gait abnormality due to chronic knee pain, foot pain,  Laboratory evaluation August 30, 2020: Normal free T4, ESR, TSH, CMP, CBC, negative HIV, hepatitis C, A1c was 5.4, lipid panel, LDL 92,  REVIEW OF SYSTEMS: Full 14 system review of systems performed and notable only for as above All other review of systems were  negative.  ALLERGIES: Allergies  Allergen Reactions   No Known Allergies     HOME MEDICATIONS: Current Outpatient Medications  Medication Sig Dispense Refill   allopurinol (ZYLOPRIM) 300 MG tablet TAKE 1 TABLET BY MOUTH EVERY DAY 90 tablet 3   amLODipine (NORVASC) 10 MG tablet TAKE 1 TABLET (10 MG TOTAL) BY MOUTH DAILY. 90 tablet 3   Cholecalciferol 100 MCG (4000 UT) CAPS Take 4,000 Units by mouth daily.     CONTOUR NEXT TEST test strip USE AS INSTRUCTED, TO TEST TWICE A DAY 100 each 12   diazepam (VALIUM) 5 MG tablet TAKE 1 TABLET (5 MG TOTAL) BY MOUTH EVERY 12 (TWELVE) HOURS AS NEEDED. FOR ANXIETY 60 tablet 5   furosemide (LASIX) 40 MG tablet TAKE 1 TABLET BY MOUTH DAILY 90 tablet 3   gabapentin (NEURONTIN) 400 MG capsule Take 800 mg by mouth 4 (four) times daily.     HYDROcodone-acetaminophen (NORCO) 10-325 MG tablet Take 1 tablet by mouth every 4 (four) hours as needed.     KLOR-CON M20 20 MEQ tablet TAKE 1 TABLET (20 MEQ TOTAL) BY MOUTH 3 (THREE) TIMES DAILY. 270 tablet 3   metoprolol succinate (TOPROL-XL) 50 MG 24 hr tablet TAKE 1 TABLET (50 MG TOTAL) BY MOUTH DAILY. TAKE WITH OR IMMEDIATELY FOLLOWING A MEAL. 90 tablet 3   olmesartan (BENICAR) 40 MG tablet TAKE 1 TABLET BY MOUTH EVERY DAY 90 tablet 1   Omega-3 Fatty Acids (FISH OIL) 1000 MG CAPS Take 1,000 mg by mouth  daily. WILL STOP PRIOR TO PROCEDURE     ONE TOUCH ULTRA TEST test strip TEST 2 TIMES A DAY 300 each 3   pantoprazole (PROTONIX) 40 MG tablet TAKE 1 TABLET (40 MG TOTAL) BY MOUTH DAILY. 90 tablet 3   simvastatin (ZOCOR) 20 MG tablet TAKE 1 TABLET (20 MG TOTAL) BY MOUTH AT BEDTIME. 90 tablet 1   vitamin A 10000 UNIT capsule Take 10,000 Units by mouth daily.     vitamin E 1000 UNIT capsule Take 1,000 Units by mouth daily.     No current facility-administered medications for this visit.    PAST MEDICAL HISTORY: Past Medical History:  Diagnosis Date   Cancer ([place])    FACIAL CANCER    Dependent  edema    Diabetes mellitus    Headache    Hypertension    Hypertriglyceridemia    Hypokalemia    Metrorrhagia    Obesity    Peptic ulcer disease    PONV (postoperative nausea and vomiting)    SUI (stress urinary incontinence, female)    Tobacco abuse     PAST SURGICAL HISTORY: Past Surgical History:  Procedure Laterality Date   ANTERIOR CRUCIATE LIGAMENT REPAIR     BILATERAL  92+95   arthroscopic knee surgery  11/04   right knee   CYST EXCISION     OVARY      perforated duodenal ulcer  04/08   RADIOACTIVE SEED IMPLANT     REVERSE SHOULDER ARTHROPLASTY Right 07/05/2016   Procedure: REVERSE SHOULDER ARTHROPLASTY;  Surgeon: Justice Britain, MD;  Location: [facility];  Service: Orthopedics;  Laterality: Right;    FAMILY HISTORY: Family History  Problem Relation Age of Onset   Hypertension Mother    Dementia Mother    Lung cancer Father     SOCIAL HISTORY: Social History   Socioeconomic History   Marital status: Married    Spouse name: Not on file   Number of children: 2   Years of education: some college   Highest education level: Not on file  Occupational History   Occupation: no longer working  Tobacco Use   Smoking status: Current Every Day Smoker    Packs/day: 1.00    Types: Cigarettes    Last attempt to quit: 07/30/2008    Years since quitting: 12.1   Smokeless tobacco: Never Used  Substance and Sexual Activity   Alcohol use: No    Alcohol/week: 0.0 standard drinks   Drug use: No   Sexual activity: Not on file  Other Topics Concern   Not on file  Social History Narrative   Lives at home with her husband.   Right-handed.   2 cups caffeine daily.    Social Determinants of Health   Financial Resource Strain: Not on file  Food Insecurity: Not on file  Transportation Needs: Not on file  Physical Activity: Not on file  Stress: Not on file  Social Connections: Not on file  Intimate Partner Violence: Not on file     PHYSICAL  EXAM   Vitals:   09/05/20 1256  BP: 129/76  Pulse: 68  Weight: 214 lb 8 oz (97.3 kg)  Height: '5\' 3"'  (1.6 m)   Not recorded     Body mass index is 38 kg/m.  PHYSICAL EXAMNIATION:  Gen: NAD, conversant, well nourised, well groomed                     Cardiovascular: Regular rate rhythm, no peripheral edema, warm, nontender.  Eyes: Conjunctivae clear without exudates or hemorrhage Neck: Supple, no carotid bruits. Pulmonary: Clear to auscultation bilaterally   NEUROLOGICAL EXAM:  MENTAL STATUS: Speech:    Speech is normal; fluent and spontaneous with normal comprehension.  Cognition:     Orientation to time, place and person     Normal recent and remote memory     Normal Attention span and concentration     Normal Language, naming, repeating,spontaneous speech     Fund of knowledge   CRANIAL NERVES: CN II: Visual fields are full to confrontation. Pupils are round equal and briskly reactive to light. CN III, IV, VI: extraocular movement are normal. No ptosis. CN V: Facial sensation is intact to light touch CN VII: Face is symmetric with normal eye closure  CN VIII: Hearing is normal to causal conversation. CN IX, X: Phonation is normal. CN XI: Head turning and shoulder shrug are intact  MOTOR: There is no pronator drift of out-stretched arms. Muscle bulk and tone are normal. Muscle strength is normal.  REFLEXES: Reflexes are 2+ and symmetric at the biceps, triceps, knees, and ankles. Plantar responses are flexor.  SENSORY: Intact to light touch, pinprick and vibratory sensation are intact in fingers and toes.  COORDINATION: There is no trunk or limb dysmetria noted.  GAIT/STANCE: Need push-up to get up from seated position, bilateral valgus knees, unsteady, Romberg is absent.   DIAGNOSTIC DATA (LABS, IMAGING, TESTING) - I reviewed patient records, labs, notes, testing and imaging myself where available.   ASSESSMENT AND PLAN  Gabriella [name] is a 65  y.o. female   Sudden onset dizziness, blurry vision, nausea on August 22, 2020  Examination today is back to baseline now per patient  Vascular risk factor of hypertension, hyperlipidemia, obesity, longtime smoker,  Differentiation diagnosis include posterior circulation TIA, the other possibility including medication side effect, she is on polypharmacy, hydrocodone, Valium, gabapentin,  Continue aspirin 81 mg daily  Complete evaluation with MRI of the brain without contrast  MRA of the brain/neck  Echocardiogram  Marcial Pacas, M.D. Ph.D.  [facility] [address], [place], [place] [address] Ph: [phone] Fax: [phone]  CC:  Elby Showers, MD [address] [place],  [place] [id number]

## 2020-09-07 ENCOUNTER — Telehealth: Payer: Self-pay | Admitting: Neurology

## 2020-09-07 NOTE — Telephone Encounter (Signed)
no to the covid questions MR Brain wo contrast, MRA Head wo contrast & MRA Neck w/wo contrast Dr. Willette Pa Josem Kaufmann: [phone] (exp. 09/06/20 to 03/04/21). Patient is scheduled at [facility] for 09/13/20.

## 2020-09-13 ENCOUNTER — Telehealth: Payer: Self-pay | Admitting: Neurology

## 2020-09-13 ENCOUNTER — Ambulatory Visit: Payer: BC Managed Care – PPO

## 2020-09-13 ENCOUNTER — Other Ambulatory Visit: Payer: Self-pay

## 2020-09-13 DIAGNOSIS — R269 Unspecified abnormalities of gait and mobility: Secondary | ICD-10-CM | POA: Diagnosis not present

## 2020-09-13 DIAGNOSIS — G459 Transient cerebral ischemic attack, unspecified: Secondary | ICD-10-CM | POA: Diagnosis not present

## 2020-09-13 MED ORDER — GADOBENATE DIMEGLUMINE 529 MG/ML IV SOLN
20.0000 mL | Freq: Once | INTRAVENOUS | Status: AC | PRN
  Administered 2020-09-13: 20 mL via INTRAVENOUS

## 2020-09-13 NOTE — Telephone Encounter (Signed)
Pt needs Echo scheduled and has not heard from anyone. Thank you!

## 2020-09-14 NOTE — Telephone Encounter (Signed)
Order updated . PA done . Sent Butch Penny a message . Butch Penny Will call patient to schedule [facility] . Thanks Hinton Dyer

## 2020-09-15 ENCOUNTER — Telehealth: Payer: Self-pay | Admitting: Neurology

## 2020-09-15 NOTE — Telephone Encounter (Signed)
Please call patient, MRI of the brain was normal  MRA of the neck showed small shallow plaque, but there was no hemodynamic significant stenosis  MRA of the brain was normal  She may continue her current medications

## 2020-09-15 NOTE — Telephone Encounter (Signed)
MRA of the neck showed small shallow plaque, but there was no hemodynamic significant stenosis.  MRA of the brain was normal. She may continue her current medications. She stated she hasn't heard back form [facility] D[facility] about her ECHO. I advised will have [facility] D[facility] check on it.   Patient verbalized understanding, appreciation.

## 2020-09-15 NOTE — Telephone Encounter (Signed)
Noted I sent message send back to [facility]. To schedule Thanks [facility]inton Dyer

## 2020-09-22 DIAGNOSIS — M47817 Spondylosis without myelopathy or radiculopathy, lumbosacral region: Secondary | ICD-10-CM | POA: Diagnosis not present

## 2020-09-22 DIAGNOSIS — G894 Chronic pain syndrome: Secondary | ICD-10-CM | POA: Diagnosis not present

## 2020-09-22 DIAGNOSIS — M25551 Pain in right hip: Secondary | ICD-10-CM | POA: Diagnosis not present

## 2020-09-22 DIAGNOSIS — M150 Primary generalized (osteo)arthritis: Secondary | ICD-10-CM | POA: Diagnosis not present

## 2020-10-06 ENCOUNTER — Other Ambulatory Visit: Payer: Self-pay

## 2020-10-06 ENCOUNTER — Ambulatory Visit (HOSPITAL_COMMUNITY)
Admission: RE | Admit: 2020-10-06 | Discharge: 2020-10-06 | Disposition: A | Discharge status: Home / Self Care | Payer: BC Managed Care – PPO | Source: Ambulatory Visit | Attending: Neurology | Admitting: Neurology

## 2020-10-06 DIAGNOSIS — G459 Transient cerebral ischemic attack, unspecified: Secondary | ICD-10-CM | POA: Diagnosis not present

## 2020-10-06 DIAGNOSIS — R269 Unspecified abnormalities of gait and mobility: Secondary | ICD-10-CM

## 2020-10-06 DIAGNOSIS — I10 Essential (primary) hypertension: Secondary | ICD-10-CM | POA: Diagnosis not present

## 2020-10-06 DIAGNOSIS — F17200 Nicotine dependence, unspecified, uncomplicated: Secondary | ICD-10-CM | POA: Diagnosis not present

## 2020-10-06 DIAGNOSIS — E119 Type 2 diabetes mellitus without complications: Secondary | ICD-10-CM | POA: Diagnosis not present

## 2020-10-06 LAB — ECHOCARDIOGRAM COMPLETE
Area-P 1/2: 3.99 cm2
Calc EF: 63.8 %
S' Lateral: 3 cm
Single Plane A2C EF: 68.5 %
Single Plane A4C EF: 59.2 %

## 2020-10-06 NOTE — Progress Notes (Signed)
  Echocardiogram 2D Echocardiogram has been performed.  Michiel Cowboy 10/06/2020, 3:39 PM

## 2020-10-10 ENCOUNTER — Telehealth: Payer: Self-pay | Admitting: *Deleted

## 2020-10-10 NOTE — Telephone Encounter (Signed)
I called the patient again. Left another detailed message with the normal results. Provided our number to call back, if needed.

## 2020-10-10 NOTE — Telephone Encounter (Signed)
Pt returned call. Please call back when available. 

## 2020-10-10 NOTE — Telephone Encounter (Signed)
-----   Message from Marcial Pacas, MD sent at 10/10/2020  9:18 AM EDT ----- Please call patient for normal echocardiogram

## 2020-10-10 NOTE — Telephone Encounter (Signed)
Left patient a detailed message, with results, on voicemail (ok per DPR).  Provided our number to call back with any questions.  

## 2020-10-20 DIAGNOSIS — M150 Primary generalized (osteo)arthritis: Secondary | ICD-10-CM | POA: Diagnosis not present

## 2020-10-20 DIAGNOSIS — M47817 Spondylosis without myelopathy or radiculopathy, lumbosacral region: Secondary | ICD-10-CM | POA: Diagnosis not present

## 2020-10-20 DIAGNOSIS — M25551 Pain in right hip: Secondary | ICD-10-CM | POA: Diagnosis not present

## 2020-10-20 DIAGNOSIS — G894 Chronic pain syndrome: Secondary | ICD-10-CM | POA: Diagnosis not present

## 2020-11-23 DIAGNOSIS — M25551 Pain in right hip: Secondary | ICD-10-CM | POA: Diagnosis not present

## 2020-11-23 DIAGNOSIS — Z79891 Long term (current) use of opiate analgesic: Secondary | ICD-10-CM | POA: Diagnosis not present

## 2020-11-23 DIAGNOSIS — M150 Primary generalized (osteo)arthritis: Secondary | ICD-10-CM | POA: Diagnosis not present

## 2020-11-23 DIAGNOSIS — G894 Chronic pain syndrome: Secondary | ICD-10-CM | POA: Diagnosis not present

## 2020-11-23 DIAGNOSIS — M47817 Spondylosis without myelopathy or radiculopathy, lumbosacral region: Secondary | ICD-10-CM | POA: Diagnosis not present

## 2020-11-24 ENCOUNTER — Other Ambulatory Visit: Payer: BC Managed Care – PPO | Admitting: Internal Medicine

## 2020-11-24 ENCOUNTER — Other Ambulatory Visit: Payer: Self-pay

## 2020-11-24 DIAGNOSIS — E781 Pure hyperglyceridemia: Secondary | ICD-10-CM | POA: Diagnosis not present

## 2020-11-24 DIAGNOSIS — I10 Essential (primary) hypertension: Secondary | ICD-10-CM | POA: Diagnosis not present

## 2020-11-24 DIAGNOSIS — R7302 Impaired glucose tolerance (oral): Secondary | ICD-10-CM | POA: Diagnosis not present

## 2020-11-25 LAB — HEPATIC FUNCTION PANEL
AG Ratio: 1.6 (calc) (ref 1.0–2.5)
ALT: 28 U/L (ref 6–29)
AST: 26 U/L (ref 10–35)
Albumin: 4.2 g/dL (ref 3.6–5.1)
Alkaline phosphatase (APISO): 78 U/L (ref 37–153)
Bilirubin, Direct: 0.1 mg/dL (ref 0.0–0.2)
Globulin: 2.6 g/dL (calc) (ref 1.9–3.7)
Indirect Bilirubin: 0.2 mg/dL (calc) (ref 0.2–1.2)
Total Bilirubin: 0.3 mg/dL (ref 0.2–1.2)
Total Protein: 6.8 g/dL (ref 6.1–8.1)

## 2020-11-25 LAB — LIPID PANEL
Cholesterol: 165 mg/dL (ref <200)
HDL: 40 mg/dL — ABNORMAL LOW (ref >=50)
LDL Cholesterol (Calc): 97 mg/dL (calc)
Non-HDL Cholesterol (Calc): 125 mg/dL (calc) (ref <130)
Total CHOL/HDL Ratio: 4.1 (calc) (ref <5.0)
Triglycerides: 185 mg/dL — ABNORMAL HIGH (ref <150)

## 2020-11-25 LAB — HEMOGLOBIN A1C
Hgb A1c MFr Bld: 5.6 % of total Hgb (ref <5.7)
Mean Plasma Glucose: 114 mg/dL
eAG (mmol/L): 6.3 mmol/L

## 2020-12-06 ENCOUNTER — Other Ambulatory Visit: Payer: BC Managed Care – PPO | Admitting: Internal Medicine

## 2020-12-08 ENCOUNTER — Ambulatory Visit: Payer: BC Managed Care – PPO | Admitting: Internal Medicine

## 2020-12-08 ENCOUNTER — Other Ambulatory Visit: Payer: Self-pay

## 2020-12-08 VITALS — BP 120/80 | HR 68 | Ht 63.0 in | Wt 210.0 lb

## 2020-12-08 DIAGNOSIS — E8881 Metabolic syndrome: Secondary | ICD-10-CM

## 2020-12-08 DIAGNOSIS — R8290 Unspecified abnormal findings in urine: Secondary | ICD-10-CM

## 2020-12-08 DIAGNOSIS — E786 Lipoprotein deficiency: Secondary | ICD-10-CM | POA: Diagnosis not present

## 2020-12-08 DIAGNOSIS — I10 Essential (primary) hypertension: Secondary | ICD-10-CM

## 2020-12-08 DIAGNOSIS — Z8659 Personal history of other mental and behavioral disorders: Secondary | ICD-10-CM

## 2020-12-08 DIAGNOSIS — Z6837 Body mass index (BMI) 37.0-37.9, adult: Secondary | ICD-10-CM | POA: Diagnosis not present

## 2020-12-08 DIAGNOSIS — F17200 Nicotine dependence, unspecified, uncomplicated: Secondary | ICD-10-CM

## 2020-12-08 DIAGNOSIS — R7302 Impaired glucose tolerance (oral): Secondary | ICD-10-CM

## 2020-12-08 DIAGNOSIS — G8929 Other chronic pain: Secondary | ICD-10-CM

## 2020-12-08 DIAGNOSIS — R300 Dysuria: Secondary | ICD-10-CM

## 2020-12-08 DIAGNOSIS — M7918 Myalgia, other site: Secondary | ICD-10-CM

## 2020-12-08 MED ORDER — SIMVASTATIN 40 MG PO TABS: 40.0000 mg | ORAL_TABLET | Freq: Every day | ORAL | 3 refills | Status: DC

## 2020-12-08 MED ORDER — CONTOUR NEXT TEST VI STRP: ORAL_STRIP | 12 refills | Status: DC

## 2020-12-08 MED ORDER — DIAZEPAM 5 MG PO TABS: 5.0000 mg | ORAL_TABLET | Freq: Two times a day (BID) | ORAL | 5 refills | Status: DC | PRN

## 2020-12-08 NOTE — Progress Notes (Signed)
   Subjective:    Patient ID: Gabriella [name], female    DOB: August 22, 1955, 65 y.o.   MRN: [id number]  HPI 65 year old Female for 6 month follow up. Triglycerides are elevated a bit more than last visit. Will increase Zocor to 40 mg daily. Hgb AIC stable. BP stable. Some stress with daughter.  History of impaired glucose tolerance, obesity, osteoarthritis, hyperlipidemia, hypertension, chronic back and ankle pain.  She is followed in [facility] for chronic pain and is on chronic narcotic medication.  History of irritable bowel syndrome treated with Bentyl.  No alcohol consumption.  Former smoker.  Recent episode of near syncope in February.  Complained of acute weakness in her neck.  Could not hold her head up.  She went to the bathroom, vomited once, had bowel movement.  Reached for sink countertop, felt weak and went down on the floor but did not strike her head.  Made it back to the recliner in another room and vomited once more.  Waited for couple of hours, took a shower and felt better.  Denies chest pain or shortness of breath.  Saw Dr. Krista Blue after extensive evaluation here with lab studies and physical exam.  MRI of the brain and echocardiogram were ordered.  Patient is on aspirin 81 mg daily.  Noted that she has polypharmacy with hydrocodone Valium and gabapentin.  She had no significant stenosis on MRA of neck.  MRI of the brain showed no acute stroke, mass-effect or midline shift.  Echocardiogram showed normal ejection fraction.  Mitral valve was normal.  Aortic valve not well visualized.  No aortic stenosis present.  Ejection fraction estimated 60 to 65%.  Left atrial size was normal.  No pericardial effusion.  Reminded regarding diabetic eye exam.  Not able to really exercise much due to chronic musculoskeletal pain.  Patient must of had vasovagal syncopal episode.  This is not recurred since the original episode that she told me about in February.  This episode occurred in  late January.  Says she feels well now.  No new complaints.  Labs reviewed showed triglycerides elevated at 185, HDL is low at 40, total cholesterol 165 and LDL cholesterol 97.  Hemoglobin A1c is excellent at 5.6%.  Liver functions are normal.  Review of Systems no headache, nausea, vomiting, chest pain, shortness of breath     Objective:   Physical Exam Blood pressure 120/80 pulse 68 pulse oximetry 98% weight 210 pounds BMI 37.20  Skin: Warm and dry.  Nodes none.  Neck is supple without JVD thyromegaly or carotid bruits.  Chest is clear to auscultation.  Cardiac exam: Regular rate and rhythm without ectopy.  No lower extremity pitting edema.  Neuro intact without focal deficits.  Affect thought and judgment are normal.       Assessment & Plan:  Syncope-did not find evidence of CVA or tumor.  Echocardiogram was within normal limits.  Chronic pain management-for [facility] for chronic musculoskeletal pain and osteoarthritis  Impaired glucose tolerance-stable  Hyperlipidemia-increasing simvastatin to 40 mg daily and follow-up in 3 months  Essential hypertension-stable on current regimen  Obesity-continues with diet and exercise regimen.  BMI 37.  Reminded about diabetic eye exam.  Declines COVID-vaccine.

## 2020-12-08 NOTE — Patient Instructions (Addendum)
Increase simvaststain to 40 milligrams daily.  Otherwise continue current medications as previously prescribed.  Follow-up in 3 months.  Has declined COVID-vaccine.  Reminded about diabetic eye exam.  Continue diet and exercise regimen.  Valium refilled for anxiety to take sparingly.

## 2020-12-14 ENCOUNTER — Other Ambulatory Visit: Payer: Self-pay | Admitting: Internal Medicine

## 2020-12-22 DIAGNOSIS — G894 Chronic pain syndrome: Secondary | ICD-10-CM | POA: Diagnosis not present

## 2020-12-22 DIAGNOSIS — M47817 Spondylosis without myelopathy or radiculopathy, lumbosacral region: Secondary | ICD-10-CM | POA: Diagnosis not present

## 2020-12-22 DIAGNOSIS — Z79891 Long term (current) use of opiate analgesic: Secondary | ICD-10-CM | POA: Diagnosis not present

## 2020-12-22 DIAGNOSIS — M150 Primary generalized (osteo)arthritis: Secondary | ICD-10-CM | POA: Diagnosis not present

## 2021-01-04 ENCOUNTER — Ambulatory Visit: Payer: BC Managed Care – PPO | Admitting: Neurology

## 2021-01-23 ENCOUNTER — Telehealth: Payer: Self-pay | Admitting: Neurology

## 2021-01-23 ENCOUNTER — Ambulatory Visit (INDEPENDENT_AMBULATORY_CARE_PROVIDER_SITE_OTHER): Payer: BC Managed Care – PPO | Admitting: Neurology

## 2021-01-23 ENCOUNTER — Encounter: Payer: Self-pay | Admitting: Neurology

## 2021-01-23 VITALS — BP 99/60 | HR 68 | Ht 64.0 in | Wt 212.0 lb

## 2021-01-23 DIAGNOSIS — G459 Transient cerebral ischemic attack, unspecified: Secondary | ICD-10-CM

## 2021-01-23 DIAGNOSIS — R269 Unspecified abnormalities of gait and mobility: Secondary | ICD-10-CM | POA: Diagnosis not present

## 2021-01-23 NOTE — Telephone Encounter (Signed)
At [facility] request, would you kindly send her recent imaging reports over to her pain medicine doctor, Dr. Nicholaus Bloom.

## 2021-01-23 NOTE — Patient Instructions (Signed)
Remain on aspirin 81 mg daily Call for any further spells Continue to see your primary care doctor  See you back as needed

## 2021-01-23 NOTE — Progress Notes (Signed)
PATIENT: Gabriella [name] DOB: 03-Oct-1955  REASON FOR VISIT: follow up HISTORY FROM: patient Primary Neurologist: Dr. Krista Blue  HISTORY  Gabriella [name] is a 65 year old female, seen in request by her primary care doctor Tedra Senegal for evaluation of sudden onset dizziness, nausea, unsteady gait, initial evaluation was on September 05, 2020.   I reviewed and summarized the referring note.  Past medical history Hypertension Hyperlipidemia Gout Depression, anxiety, Valium 5 mg 1 to 2 tablets each day, Chronic pain, on Norco 10/325 mg 6 tablets each day, gabapentin 800 mg 4 times a day, Smoker, 1ppd.   On August 22, 2020, around 4:30 PM, after feeding her cat, walking back home, she suddenly felt dizzy, her head was so heavy, kept her body going forward, she has to hold onto walls to get back to her room, then fell backwards on the floor, twisted her left knee, difficulty getting up, she has to crawl up to her bathroom, she denied loss of consciousness, after using bathroom, she continue to feel tired, weak, nauseous, was able to manage to her recliner, when her husband came back 30 minutes later, she was noted to have mild elevated blood pressure 160/110th, she denied chest pain, denies difficulty talking, she felt weak rest of the evening, but began to gradually improve   Now she is almost back to herself, has gait abnormality due to chronic knee pain, foot pain,   Laboratory evaluation August 30, 2020: Normal free T4, ESR, TSH, CMP, CBC, negative HIV, hepatitis C, A1c was 5.4, lipid panel, LDL 92,  Update January 23, 2021 SS: No further spells, since this single event in August 22, 2020.  She has remained on aspirin 81 mg daily.  Extensive imaging evaluation has been unremarkable.  No changes to medical history, remains under the care of pain management for chronic back pain.   MRI of the brain was unremarkable, MRA of the neck showed shallow plaques at bilateral internal carotid  arteries, without significant stenosis, MRA of the head was normal in FEB 2022. Echocardiogram was normal.  REVIEW OF SYSTEMS: Out of a complete 14 system review of symptoms, the patient complains only of the following symptoms, and all other reviewed systems are negative.  See HPI  ALLERGIES: Allergies  Allergen Reactions   No Known Allergies     HOME MEDICATIONS: Outpatient Medications Prior to Visit  Medication Sig Dispense Refill   allopurinol (ZYLOPRIM) 300 MG tablet TAKE 1 TABLET BY MOUTH EVERY DAY 90 tablet 3   amLODipine (NORVASC) 10 MG tablet TAKE 1 TABLET (10 MG TOTAL) BY MOUTH DAILY. 90 tablet 3   Aspirin 81 MG CAPS Take by mouth.     Cholecalciferol 100 MCG (4000 UT) CAPS Take 4,000 Units by mouth daily.     diazepam (VALIUM) 5 MG tablet Take 1 tablet (5 mg total) by mouth every 12 (twelve) hours as needed. for anxiety 60 tablet 5   furosemide (LASIX) 40 MG tablet TAKE 1 TABLET BY MOUTH DAILY 90 tablet 3   gabapentin (NEURONTIN) 400 MG capsule Take 800 mg by mouth 4 (four) times daily.     glucose blood (CONTOUR NEXT TEST) test strip USE AS INSTRUCTED, TO TEST TWICE A DAY 100 each 12   HYDROcodone-acetaminophen (NORCO) 10-325 MG tablet Take 1 tablet by mouth every 4 (four) hours as needed.     KLOR-CON M20 20 MEQ tablet TAKE 1 TABLET (20 MEQ TOTAL) BY MOUTH 3 (THREE) TIMES DAILY. 270 tablet 3  metoprolol succinate (TOPROL-XL) 50 MG 24 hr tablet TAKE 1 TABLET (50 MG TOTAL) BY MOUTH DAILY. TAKE WITH OR IMMEDIATELY FOLLOWING A MEAL. 90 tablet 3   olmesartan (BENICAR) 40 MG tablet TAKE 1 TABLET BY MOUTH EVERY DAY 90 tablet 1   Omega-3 Fatty Acids (FISH OIL) 1000 MG CAPS Take 1,000 mg by mouth daily. WILL STOP PRIOR TO PROCEDURE     ONE TOUCH ULTRA TEST test strip TEST 2 TIMES A DAY 300 each 3   pantoprazole (PROTONIX) 40 MG tablet TAKE 1 TABLET (40 MG TOTAL) BY MOUTH DAILY. 90 tablet 3   simvastatin (ZOCOR) 40 MG tablet Take 1 tablet (40 mg total) by mouth at bedtime. 90 tablet  3   vitamin A 10000 UNIT capsule Take 10,000 Units by mouth daily.     vitamin E 1000 UNIT capsule Take 1,000 Units by mouth daily.     No facility-administered medications prior to visit.    PAST MEDICAL HISTORY: Past Medical History:  Diagnosis Date   Cancer ([place])    FACIAL CANCER    Dependent edema    Diabetes mellitus    Headache    Hypertension    Hypertriglyceridemia    Hypokalemia    Metrorrhagia    Obesity    Peptic ulcer disease    PONV (postoperative nausea and vomiting)    SUI (stress urinary incontinence, female)    Tobacco abuse     PAST SURGICAL HISTORY: Past Surgical History:  Procedure Laterality Date   ANTERIOR CRUCIATE LIGAMENT REPAIR     BILATERAL  92+95   arthroscopic knee surgery  11/04   right knee   CYST EXCISION     OVARY      perforated duodenal ulcer  04/08   RADIOACTIVE SEED IMPLANT     REVERSE SHOULDER ARTHROPLASTY Right 07/05/2016   Procedure: REVERSE SHOULDER ARTHROPLASTY;  Surgeon: Justice Britain, MD;  Location: [facility];  Service: Orthopedics;  Laterality: Right;    FAMILY HISTORY: Family History  Problem Relation Age of Onset   Hypertension Mother    Dementia Mother    Lung cancer Father     SOCIAL HISTORY: Social History   Socioeconomic History   Marital status: Married    Spouse name: Not on file   Number of children: 2   Years of education: some college   Highest education level: Not on file  Occupational History   Occupation: no longer working  Tobacco Use   Smoking status: Every Day    Packs/day: 1.00    Pack years: 0.00    Types: Cigarettes    Last attempt to quit: 07/30/2008    Years since quitting: 12.4   Smokeless tobacco: Never  Substance and Sexual Activity   Alcohol use: No    Alcohol/week: 0.0 standard drinks   Drug use: No   Sexual activity: Not on file  Other Topics Concern   Not on file  Social History Narrative   Lives at home with her husband.   Right-handed.   2 cups caffeine daily.    Social  Determinants of Health   Financial Resource Strain: Not on file  Food Insecurity: Not on file  Transportation Needs: Not on file  Physical Activity: Not on file  Stress: Not on file  Social Connections: Not on file  Intimate Partner Violence: Not on file   PHYSICAL EXAM  Vitals:   01/23/21 1236  BP: 99/60  Pulse: 68  Weight: 212 lb (96.2 kg)  Height: '5\' 4"'  (  1.626 m)   Body mass index is 36.39 kg/m.  Generalized: Well developed, in no acute distress   Neurological examination  Mentation: Alert oriented to time, place, history taking. Follows all commands speech and language fluent Cranial nerve II-XII: Pupils were equal round reactive to light. Extraocular movements were full, visual field were full on confrontational test. Facial sensation and strength were normal. Head turning and shoulder shrug  were normal and symmetric. Motor: The motor testing reveals 5 over 5 strength of all 4 extremities. Good symmetric motor tone is noted throughout.  Sensory: Sensory testing is intact to soft touch on all 4 extremities. No evidence of extinction is noted.  Coordination: Cerebellar testing reveals good finger-nose-finger and heel-to-shin bilaterally.  Gait and station: Gait is wide-based, antalgic.  Tandem gait is unsteady. Reflexes: Deep tendon reflexes are symmetric and normal bilaterally.   DIAGNOSTIC DATA (LABS, IMAGING, TESTING) - I reviewed patient records, labs, notes, testing and imaging myself where available.  Lab Results  Component Value Date   WBC 5.9 08/30/2020   HGB 15.4 08/30/2020   HCT 46.0 (H) 08/30/2020   MCV 99.1 08/30/2020   PLT 264 08/30/2020      Component Value Date/Time   NA 141 08/30/2020 1235   K 4.1 08/30/2020 1235   CL 103 08/30/2020 1235   CO2 28 08/30/2020 1235   GLUCOSE 84 08/30/2020 1235   BUN 23 08/30/2020 1235   CREATININE 0.54 08/30/2020 1235   CALCIUM 9.3 08/30/2020 1235   PROT 6.8 11/24/2020 0951   ALBUMIN 4.5 11/06/2016 0942   AST  26 11/24/2020 0951   ALT 28 11/24/2020 0951   ALKPHOS 84 11/06/2016 0942   BILITOT 0.3 11/24/2020 0951   GFRNONAA 100 08/30/2020 1235   GFRAA 116 08/30/2020 1235   Lab Results  Component Value Date   CHOL 165 11/24/2020   HDL 40 (L) 11/24/2020   LDLCALC 97 11/24/2020   TRIG 185 (H) 11/24/2020   CHOLHDL 4.1 11/24/2020   Lab Results  Component Value Date   HGBA1C 5.6 11/24/2020   No results found for: VITAMINB12 Lab Results  Component Value Date   TSH 1.73 08/30/2020   ASSESSMENT AND PLAN 65 y.o. year old female  has a past medical history of Cancer ([place]), Dependent edema, Diabetes mellitus, Headache, Hypertension, Hypertriglyceridemia, Hypokalemia, Metrorrhagia, Obesity, Peptic ulcer disease, PONV (postoperative nausea and vomiting), SUI (stress urinary incontinence, female), and Tobacco abuse. here with:  1.  Sudden onset dizziness, blurry vision, nausea on August 22, 2020  -Unclear exact etiology of spell, but no recurrent spells since initial in Jan 2022, does have vascular risk factors HTN, HLD, obesity, tobacco abuse, DD included: Posterior circulation TIA, medication side effect (polypharmacy hydrocodone Valium gabapentin) -MRI of the brain was normal -MRA of the head was normal -MRA of the neck showed small shallow plaque, but no significant stenosis -Echocardiogram was normal -Recommend continue aspirin 81 mg daily, let us know of any further spells, if spell occurs, check BP, glucose; continue routine follow-up with PCP, management of vascular risk factors -Follow-up in our office on an as-needed basis  I spent 30 minutes of face-to-face and non-face-to-face time with patient.  This included previsit chart review, lab review, study review of extensive imaging, discussing the spell, risk factors, management, and follow-up.   Butler Denmark, AGNP-C, DNP 01/23/2021, 1:06 PM [facility] [address], [place], [place] [address] [phone]

## 2021-01-24 DIAGNOSIS — M47817 Spondylosis without myelopathy or radiculopathy, lumbosacral region: Secondary | ICD-10-CM | POA: Diagnosis not present

## 2021-01-24 DIAGNOSIS — G894 Chronic pain syndrome: Secondary | ICD-10-CM | POA: Diagnosis not present

## 2021-01-24 DIAGNOSIS — M150 Primary generalized (osteo)arthritis: Secondary | ICD-10-CM | POA: Diagnosis not present

## 2021-01-24 DIAGNOSIS — Z79891 Long term (current) use of opiate analgesic: Secondary | ICD-10-CM | POA: Diagnosis not present

## 2021-01-26 ENCOUNTER — Other Ambulatory Visit: Payer: Self-pay | Admitting: Anesthesiology

## 2021-01-26 DIAGNOSIS — M545 Low back pain, unspecified: Secondary | ICD-10-CM

## 2021-01-27 ENCOUNTER — Other Ambulatory Visit: Payer: Self-pay | Admitting: Internal Medicine

## 2021-01-31 ENCOUNTER — Encounter: Payer: Self-pay | Admitting: Internal Medicine

## 2021-02-12 ENCOUNTER — Ambulatory Visit
Admission: RE | Admit: 2021-02-12 | Discharge: 2021-02-12 | Disposition: A | Discharge status: Home / Self Care | Payer: BC Managed Care – PPO | Source: Ambulatory Visit | Attending: Anesthesiology | Admitting: Anesthesiology

## 2021-02-12 DIAGNOSIS — M545 Low back pain, unspecified: Secondary | ICD-10-CM

## 2021-02-12 DIAGNOSIS — M48061 Spinal stenosis, lumbar region without neurogenic claudication: Secondary | ICD-10-CM | POA: Diagnosis not present

## 2021-02-17 ENCOUNTER — Telehealth: Payer: Self-pay | Admitting: Internal Medicine

## 2021-02-17 NOTE — Telephone Encounter (Signed)
LVM to CB and schedule CPE with labs prior for November

## 2021-02-19 ENCOUNTER — Other Ambulatory Visit: Payer: Self-pay | Admitting: Internal Medicine

## 2021-02-23 DIAGNOSIS — G894 Chronic pain syndrome: Secondary | ICD-10-CM | POA: Diagnosis not present

## 2021-02-23 DIAGNOSIS — M150 Primary generalized (osteo)arthritis: Secondary | ICD-10-CM | POA: Diagnosis not present

## 2021-02-23 DIAGNOSIS — M5412 Radiculopathy, cervical region: Secondary | ICD-10-CM | POA: Diagnosis not present

## 2021-02-23 DIAGNOSIS — M47817 Spondylosis without myelopathy or radiculopathy, lumbosacral region: Secondary | ICD-10-CM | POA: Diagnosis not present

## 2021-03-14 ENCOUNTER — Other Ambulatory Visit: Payer: Self-pay

## 2021-03-14 ENCOUNTER — Other Ambulatory Visit: Payer: BC Managed Care – PPO | Admitting: Internal Medicine

## 2021-03-14 DIAGNOSIS — I10 Essential (primary) hypertension: Secondary | ICD-10-CM | POA: Diagnosis not present

## 2021-03-14 DIAGNOSIS — E781 Pure hyperglyceridemia: Secondary | ICD-10-CM

## 2021-03-14 LAB — LIPID PANEL
Cholesterol: 148 mg/dL (ref <200)
HDL: 42 mg/dL — ABNORMAL LOW (ref >=50)
LDL Cholesterol (Calc): 81 mg/dL (calc)
Non-HDL Cholesterol (Calc): 106 mg/dL (calc) (ref <130)
Total CHOL/HDL Ratio: 3.5 (calc) (ref <5.0)
Triglycerides: 150 mg/dL — ABNORMAL HIGH (ref <150)

## 2021-03-14 LAB — HEPATIC FUNCTION PANEL
AG Ratio: 1.7 (calc) (ref 1.0–2.5)
ALT: 36 U/L — ABNORMAL HIGH (ref 6–29)
AST: 23 U/L (ref 10–35)
Albumin: 3.9 g/dL (ref 3.6–5.1)
Alkaline phosphatase (APISO): 68 U/L (ref 37–153)
Bilirubin, Direct: 0.1 mg/dL (ref 0.0–0.2)
Globulin: 2.3 g/dL (calc) (ref 1.9–3.7)
Indirect Bilirubin: 0.4 mg/dL (calc) (ref 0.2–1.2)
Total Bilirubin: 0.5 mg/dL (ref 0.2–1.2)
Total Protein: 6.2 g/dL (ref 6.1–8.1)

## 2021-03-16 ENCOUNTER — Other Ambulatory Visit: Payer: Self-pay

## 2021-03-16 ENCOUNTER — Ambulatory Visit: Payer: BC Managed Care – PPO | Admitting: Internal Medicine

## 2021-03-16 ENCOUNTER — Encounter: Payer: Self-pay | Admitting: Internal Medicine

## 2021-03-16 VITALS — BP 110/80 | HR 83 | Ht 64.0 in | Wt 208.0 lb

## 2021-03-16 DIAGNOSIS — F17200 Nicotine dependence, unspecified, uncomplicated: Secondary | ICD-10-CM

## 2021-03-16 DIAGNOSIS — E781 Pure hyperglyceridemia: Secondary | ICD-10-CM

## 2021-03-16 DIAGNOSIS — R7302 Impaired glucose tolerance (oral): Secondary | ICD-10-CM

## 2021-03-16 DIAGNOSIS — E8881 Metabolic syndrome: Secondary | ICD-10-CM

## 2021-03-16 DIAGNOSIS — G8929 Other chronic pain: Secondary | ICD-10-CM

## 2021-03-16 DIAGNOSIS — F439 Reaction to severe stress, unspecified: Secondary | ICD-10-CM

## 2021-03-16 DIAGNOSIS — M7918 Myalgia, other site: Secondary | ICD-10-CM

## 2021-03-16 DIAGNOSIS — Z8659 Personal history of other mental and behavioral disorders: Secondary | ICD-10-CM

## 2021-03-16 DIAGNOSIS — E786 Lipoprotein deficiency: Secondary | ICD-10-CM

## 2021-03-16 DIAGNOSIS — I10 Essential (primary) hypertension: Secondary | ICD-10-CM

## 2021-03-16 NOTE — Progress Notes (Signed)
Subjective:    Patient ID: Gabriella [name], female    DOB: [birth year], 65 y.o.   MRN: [id number]  HPI 65 year old Female seen for 6 month follow up. Has injured right shoulder with some heavy lifting of clothes while cleaning out a closet. Will be seeing orthopedist in the near future.  Has pain when lifting right arm up over her head.  History of right reverse shoulder arthroplasty by Dr. Onnie Graham in December 2017.  She sees [facility] for chronic pain medication.  She is asking today about Medical Marijuana card.  I explained to her that I would not be participating in prescribing medical marijuana.  She may discuss this with chronic pain management.  She is having issues with her daughter who lives out of state and may be on drugs.  We talked about this at length today.  She has offered financial support but it does not seem to be enough for her daughter.  Daughter's son actually lives [place] the farm where Gabriella [name] and her husband also reside.  She is helping him purchase a home.  She actually adopted him and he goes by Gabriella [name].  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  She continues to smoke and has smoked for over 30 years.  No alcohol consumption.  She has a history of hypertension, hyperlipidemia, obesity, metabolic syndrome type 2 diabetes mellitus and history of peptic ulcer disease.  Hospitalized for perforated duodenal ulcer requiring surgery in 2008.  History of debridement of anterior cruciate ligament right knee November 2004 at [facility].  Had an episode of near syncope in February.  Was seen by Neurologist, Dr. Krista Blue who felt patient might have posterior circulation TIA or medication side effect due to polypharmacy.  MRI of the brain without contrast, MRA, and echocardiogram were all normal.  Patient has hypertension which is well controlled on amlodipine, Lasix and  olmesartan.  She is on simvastatin 40 mg daily for hyperlipidemia.  Takes generic Protonix for GE reflux.  Here today for follow-up on hyperlipidemia.  Recent lipid panel shows low HDL of 42 which is longstanding, total cholesterol 148, triglycerides 150 improved from 185 in April and LDL cholesterol 81.  Liver functions are normal with the exception of an SGPT of 36 which was previously normal in April 2022.  Not sure why this is.  She has been on a diet for some time.  Today weighs 208 pounds and was 214 pounds in February.  Over the past couple of years weight has been around 209-214 pounds.  Has longstanding left foot and ankle problems that limit the amount of exercise she can do.  Impaired glucose tolerance is controlled by diet only.  Hypertension treated with Benicar amlodipine and Lasix.  History of gout treated with allopurinol.  Is on simvastatin 40 mg daily for hyperlipidemia.  Takes Neurontin 800 mg by mouth 4 times a day for pain management.  Is also on Norco 10/325 sparingly.  Takes Valium 5 mg every 12 hours as needed for anxiety.  History of gout treated with allopurinol.  Currently on aspirin 81 mg daily after episode of near syncope.      Review of Systems see above     Objective:   Physical Exam Vital signs reviewed.  Blood pressure 110/80 pulse 83 pulse oximetry 95% weight 208 pounds  Chest is clear to auscultation.  Cardiac exam  regular rate and rhythm normal S1 and S2.  No lower extremity pitting edema.       Assessment & Plan:  Impaired glucose tolerance-hemoglobin A1c in April 5.6%.  It was not checked with this visit.  Hyperlipidemia-triglycerides at upper limits of normal at 150.  Total cholesterol 148 and LDL cholesterol 81.  Continue statin therapy  Low HDL of 42 and has ranged between 36 and 42.  Right shoulder injury-to see orthopedist in the near future.  History of reverse shoulder arthroplasty by Dr. Onnie Graham  Situational stress with  her daughter who may be on drugs discussed at length  Essential hypertension-stable on current 3 drug regimen.  GE reflux treated with generic Protonix 40 mg daily  Chronic pain management per [facility] and received hydrocodone APAP through that office.  Is also on Neurontin.  Plan: Return in 6 months for health maintenance exam and evaluation of medical issues.

## 2021-03-16 NOTE — Patient Instructions (Signed)
It was a pleasure to see you today. Continue same meds. See orthopedist. RTC in 6 months.

## 2021-03-23 ENCOUNTER — Other Ambulatory Visit: Payer: Self-pay | Admitting: Internal Medicine

## 2021-03-23 DIAGNOSIS — M5412 Radiculopathy, cervical region: Secondary | ICD-10-CM | POA: Diagnosis not present

## 2021-03-23 DIAGNOSIS — G894 Chronic pain syndrome: Secondary | ICD-10-CM | POA: Diagnosis not present

## 2021-03-23 DIAGNOSIS — M47817 Spondylosis without myelopathy or radiculopathy, lumbosacral region: Secondary | ICD-10-CM | POA: Diagnosis not present

## 2021-03-23 DIAGNOSIS — M150 Primary generalized (osteo)arthritis: Secondary | ICD-10-CM | POA: Diagnosis not present

## 2021-04-05 ENCOUNTER — Other Ambulatory Visit: Payer: Self-pay | Admitting: Anesthesiology

## 2021-04-05 DIAGNOSIS — R52 Pain, unspecified: Secondary | ICD-10-CM

## 2021-04-05 DIAGNOSIS — Z96611 Presence of right artificial shoulder joint: Secondary | ICD-10-CM | POA: Diagnosis not present

## 2021-04-11 ENCOUNTER — Other Ambulatory Visit: Payer: Self-pay | Admitting: Anesthesiology

## 2021-04-11 DIAGNOSIS — M5412 Radiculopathy, cervical region: Secondary | ICD-10-CM

## 2021-04-11 DIAGNOSIS — R52 Pain, unspecified: Secondary | ICD-10-CM

## 2021-04-17 ENCOUNTER — Ambulatory Visit
Admission: RE | Admit: 2021-04-17 | Discharge: 2021-04-17 | Disposition: A | Discharge status: Home / Self Care | Payer: BC Managed Care – PPO | Source: Ambulatory Visit | Attending: Anesthesiology | Admitting: Anesthesiology

## 2021-04-17 ENCOUNTER — Other Ambulatory Visit: Payer: Self-pay

## 2021-04-17 ENCOUNTER — Other Ambulatory Visit: Payer: BC Managed Care – PPO

## 2021-04-17 DIAGNOSIS — M4802 Spinal stenosis, cervical region: Secondary | ICD-10-CM | POA: Diagnosis not present

## 2021-04-17 DIAGNOSIS — M5412 Radiculopathy, cervical region: Secondary | ICD-10-CM

## 2021-04-24 DIAGNOSIS — M47817 Spondylosis without myelopathy or radiculopathy, lumbosacral region: Secondary | ICD-10-CM | POA: Diagnosis not present

## 2021-04-24 DIAGNOSIS — M5412 Radiculopathy, cervical region: Secondary | ICD-10-CM | POA: Diagnosis not present

## 2021-04-24 DIAGNOSIS — M150 Primary generalized (osteo)arthritis: Secondary | ICD-10-CM | POA: Diagnosis not present

## 2021-04-24 DIAGNOSIS — M4802 Spinal stenosis, cervical region: Secondary | ICD-10-CM | POA: Diagnosis not present

## 2021-05-01 IMAGING — CR DG HIP (WITH OR WITHOUT PELVIS) 3-4V BILAT
3 series · 3 of 3 positions shown · non-contrast
Comparison: Radiograph 09/16/2017

CLINICAL DATA: Hip pain

EXAM:
DG HIP (WITH OR WITHOUT PELVIS) 3-4V BILAT

[w pelvis upright]
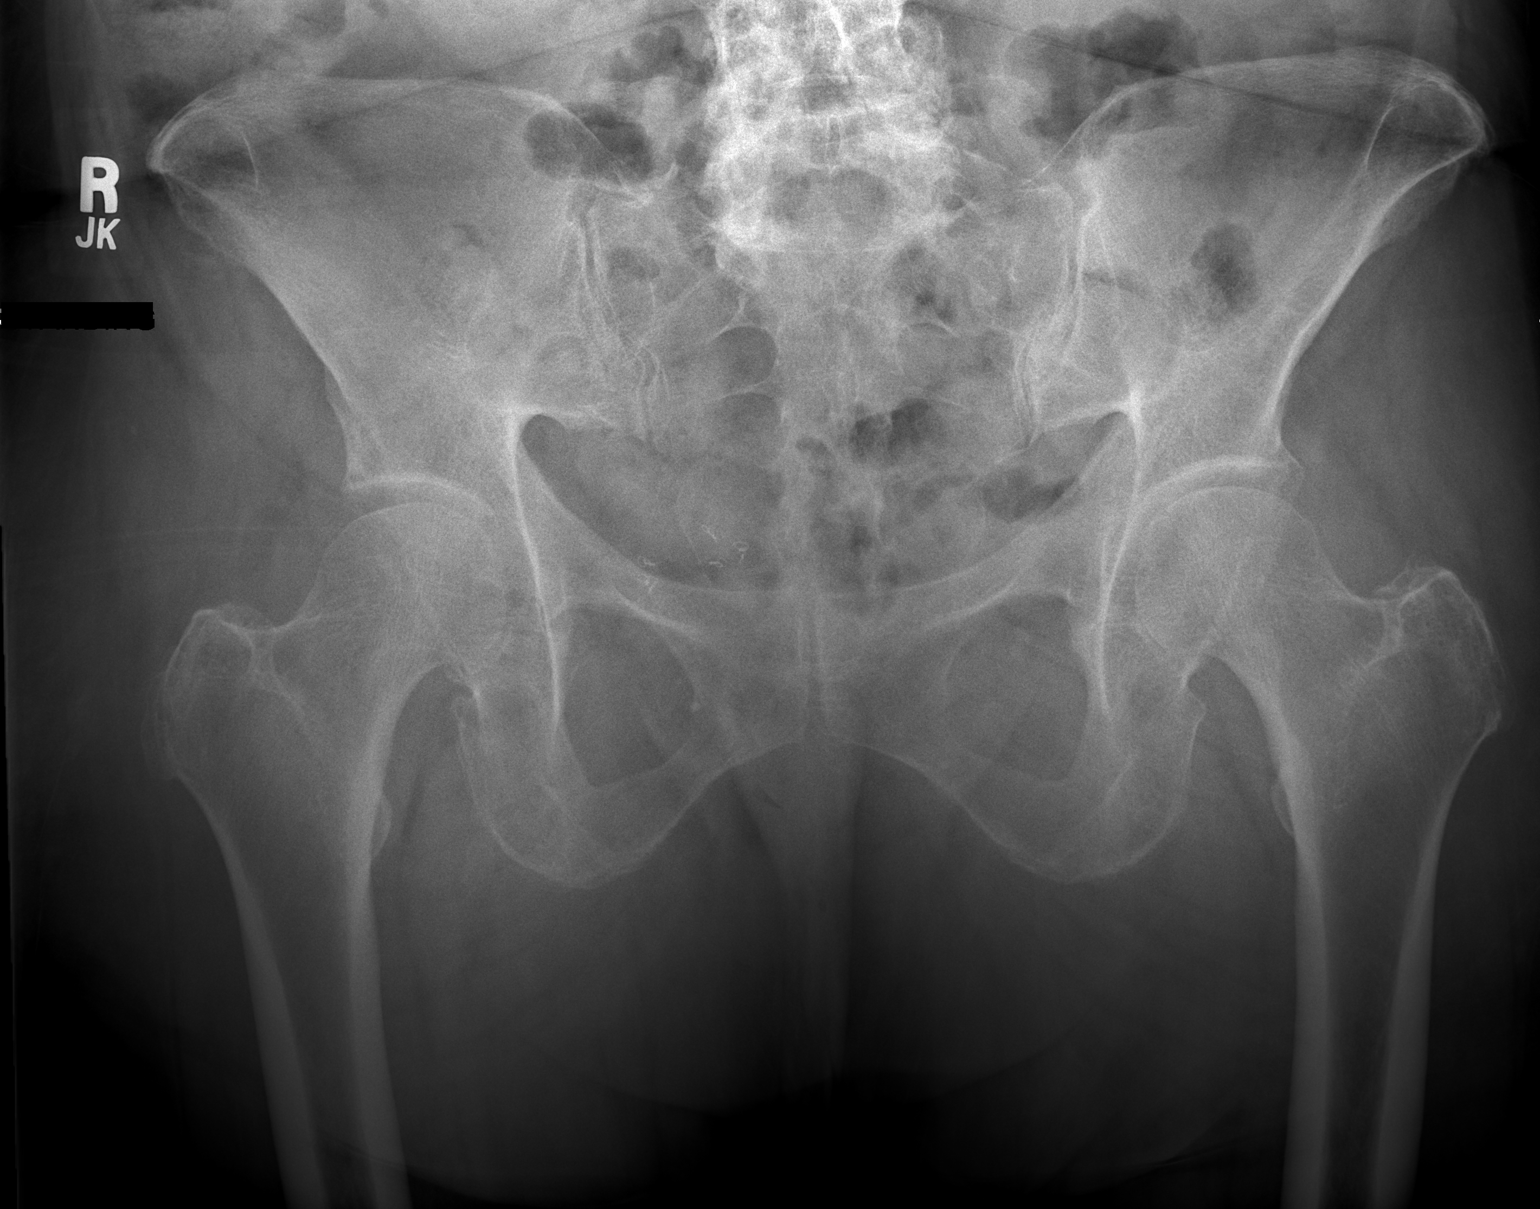

[w hip frog left]
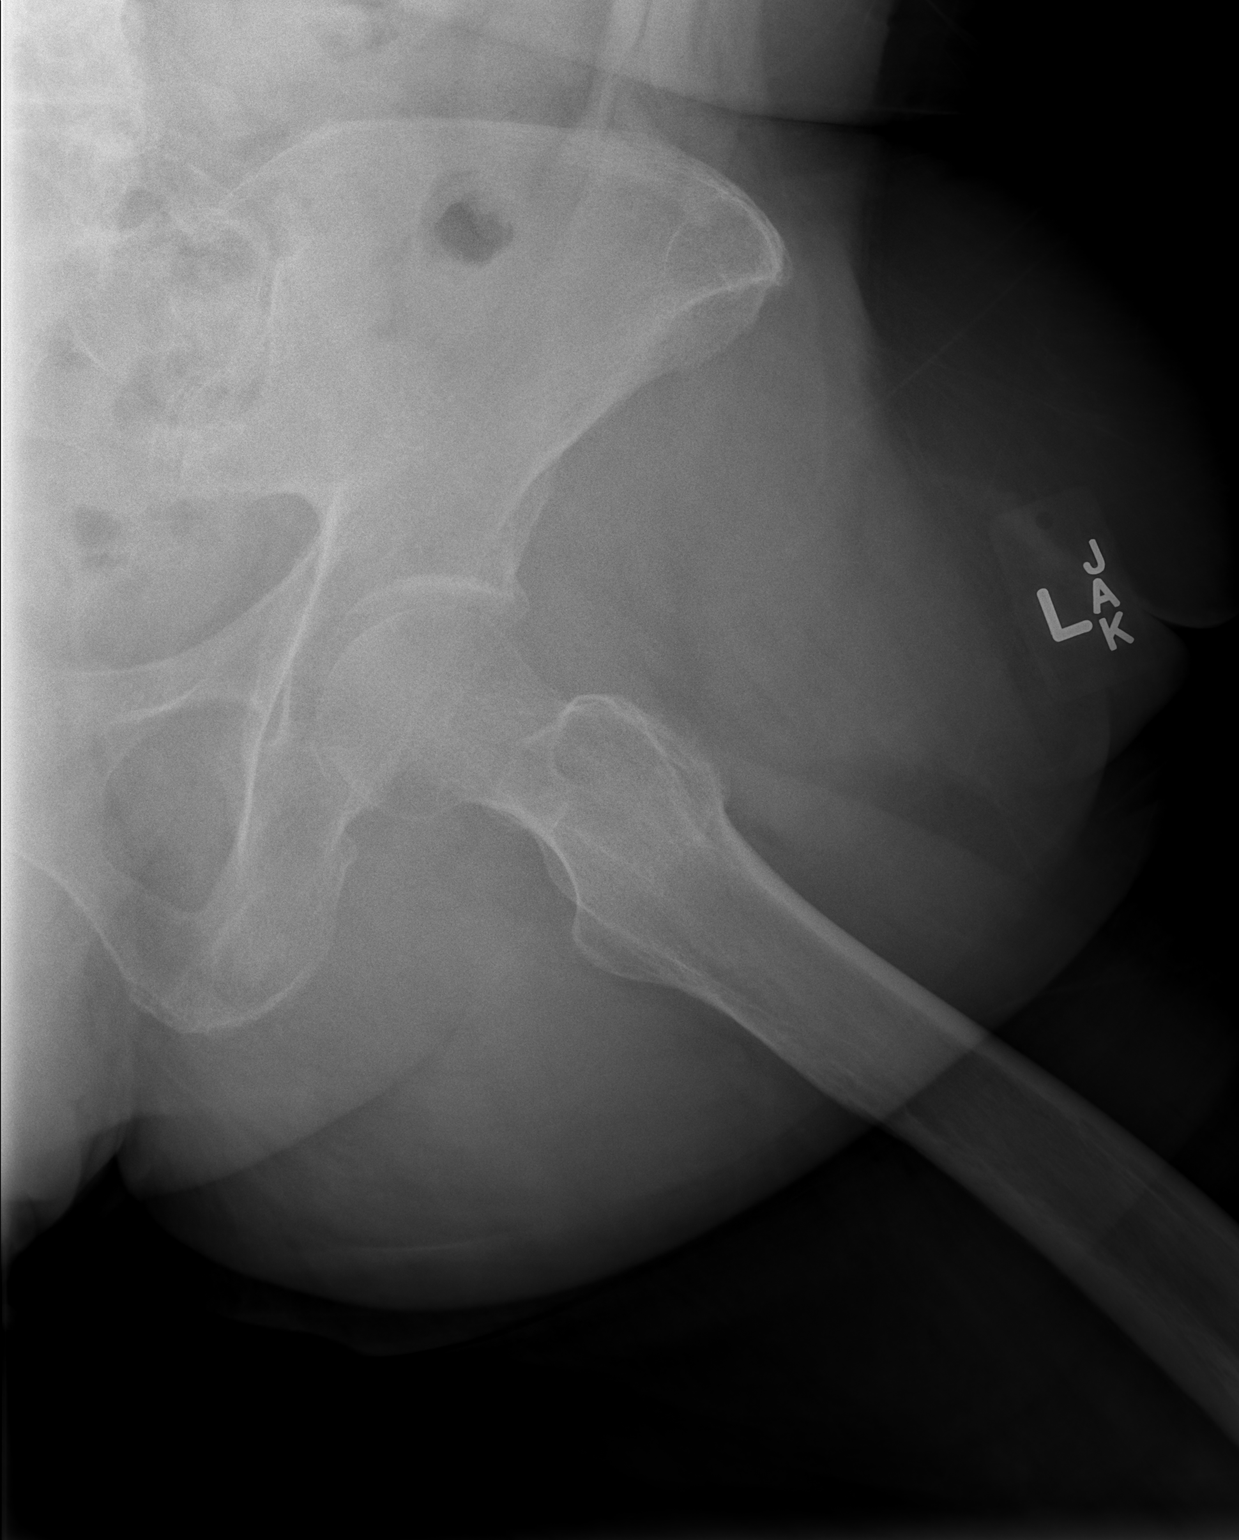

[w hip frog right]
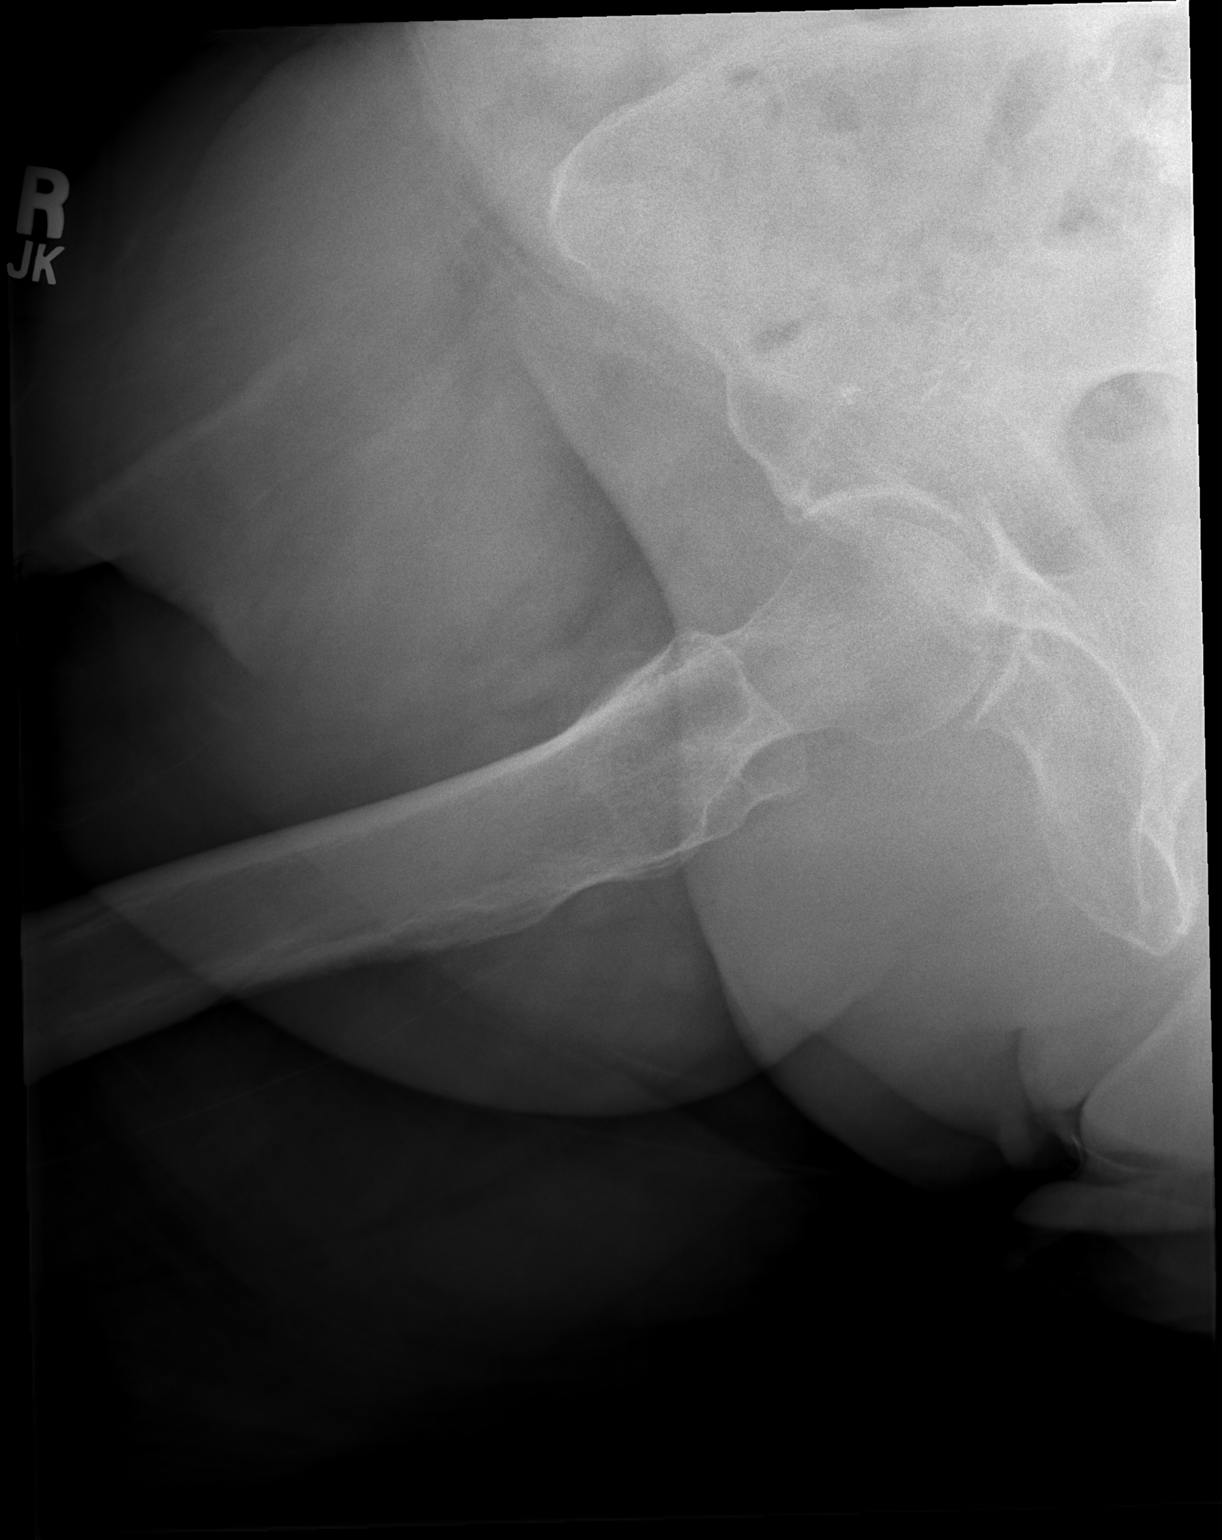

[3 of 3 positions shown; findings below may reference images not displayed]

FINDINGS: Bones of the pelvis are congruent and intact. Mild degenerative
changes in the lower lumbar spine, SI joints and symphysis pubis.
Mild bilateral acetabular degenerative changes are present. Features
on the right are similar to comparison radiographs. Postsurgical
changes are again seen in the pelvis. Soft tissues are otherwise
unremarkable.
IMPRESSION: No acute osseous abnormality.

Mild degenerative changes in both hips and throughout the pelvic
girdle.

## 2021-05-22 DIAGNOSIS — M4802 Spinal stenosis, cervical region: Secondary | ICD-10-CM | POA: Diagnosis not present

## 2021-05-22 DIAGNOSIS — M150 Primary generalized (osteo)arthritis: Secondary | ICD-10-CM | POA: Diagnosis not present

## 2021-05-22 DIAGNOSIS — M47817 Spondylosis without myelopathy or radiculopathy, lumbosacral region: Secondary | ICD-10-CM | POA: Diagnosis not present

## 2021-05-22 DIAGNOSIS — M5412 Radiculopathy, cervical region: Secondary | ICD-10-CM | POA: Diagnosis not present

## 2021-06-06 DIAGNOSIS — M542 Cervicalgia: Secondary | ICD-10-CM | POA: Diagnosis not present

## 2021-06-06 DIAGNOSIS — M48062 Spinal stenosis, lumbar region with neurogenic claudication: Secondary | ICD-10-CM | POA: Diagnosis not present

## 2021-06-06 DIAGNOSIS — M4316 Spondylolisthesis, lumbar region: Secondary | ICD-10-CM | POA: Diagnosis not present

## 2021-06-06 DIAGNOSIS — M4712 Other spondylosis with myelopathy, cervical region: Secondary | ICD-10-CM | POA: Diagnosis not present

## 2021-06-20 ENCOUNTER — Other Ambulatory Visit: Payer: Self-pay | Admitting: Internal Medicine

## 2021-06-20 DIAGNOSIS — Z1231 Encounter for screening mammogram for malignant neoplasm of breast: Secondary | ICD-10-CM

## 2021-06-21 ENCOUNTER — Other Ambulatory Visit: Payer: Self-pay | Admitting: Internal Medicine

## 2021-06-21 DIAGNOSIS — M5412 Radiculopathy, cervical region: Secondary | ICD-10-CM | POA: Diagnosis not present

## 2021-06-21 DIAGNOSIS — M47817 Spondylosis without myelopathy or radiculopathy, lumbosacral region: Secondary | ICD-10-CM | POA: Diagnosis not present

## 2021-06-21 DIAGNOSIS — M4802 Spinal stenosis, cervical region: Secondary | ICD-10-CM | POA: Diagnosis not present

## 2021-06-21 DIAGNOSIS — M150 Primary generalized (osteo)arthritis: Secondary | ICD-10-CM | POA: Diagnosis not present

## 2021-07-05 DIAGNOSIS — M4712 Other spondylosis with myelopathy, cervical region: Secondary | ICD-10-CM | POA: Diagnosis not present

## 2021-07-05 DIAGNOSIS — M4722 Other spondylosis with radiculopathy, cervical region: Secondary | ICD-10-CM | POA: Diagnosis not present

## 2021-07-05 HISTORY — PX: NECK SURGERY: SHX720

## 2021-07-20 ENCOUNTER — Other Ambulatory Visit: Payer: Self-pay | Admitting: Internal Medicine

## 2021-07-20 DIAGNOSIS — M5412 Radiculopathy, cervical region: Secondary | ICD-10-CM | POA: Diagnosis not present

## 2021-07-20 DIAGNOSIS — M4802 Spinal stenosis, cervical region: Secondary | ICD-10-CM | POA: Diagnosis not present

## 2021-07-20 DIAGNOSIS — M47817 Spondylosis without myelopathy or radiculopathy, lumbosacral region: Secondary | ICD-10-CM | POA: Diagnosis not present

## 2021-07-20 DIAGNOSIS — M150 Primary generalized (osteo)arthritis: Secondary | ICD-10-CM | POA: Diagnosis not present

## 2021-07-26 ENCOUNTER — Ambulatory Visit
Admission: RE | Admit: 2021-07-26 | Discharge: 2021-07-26 | Disposition: A | Discharge status: Home / Self Care | Payer: BC Managed Care – PPO | Source: Ambulatory Visit | Attending: Internal Medicine | Admitting: Internal Medicine

## 2021-07-26 ENCOUNTER — Other Ambulatory Visit: Payer: Self-pay

## 2021-07-26 DIAGNOSIS — Z1231 Encounter for screening mammogram for malignant neoplasm of breast: Secondary | ICD-10-CM

## 2021-08-01 DIAGNOSIS — M4316 Spondylolisthesis, lumbar region: Secondary | ICD-10-CM | POA: Diagnosis not present

## 2021-08-01 DIAGNOSIS — M4712 Other spondylosis with myelopathy, cervical region: Secondary | ICD-10-CM | POA: Diagnosis not present

## 2021-08-01 DIAGNOSIS — M48062 Spinal stenosis, lumbar region with neurogenic claudication: Secondary | ICD-10-CM | POA: Diagnosis not present

## 2021-08-23 ENCOUNTER — Other Ambulatory Visit: Payer: Self-pay | Admitting: Internal Medicine

## 2021-09-14 ENCOUNTER — Other Ambulatory Visit: Payer: Self-pay | Admitting: Internal Medicine

## 2021-09-19 ENCOUNTER — Other Ambulatory Visit: Payer: 59 | Admitting: Internal Medicine

## 2021-09-19 ENCOUNTER — Other Ambulatory Visit: Payer: Self-pay

## 2021-09-19 DIAGNOSIS — I10 Essential (primary) hypertension: Secondary | ICD-10-CM

## 2021-09-19 DIAGNOSIS — R5383 Other fatigue: Secondary | ICD-10-CM

## 2021-09-19 DIAGNOSIS — R7302 Impaired glucose tolerance (oral): Secondary | ICD-10-CM | POA: Diagnosis not present

## 2021-09-19 DIAGNOSIS — E781 Pure hyperglyceridemia: Secondary | ICD-10-CM

## 2021-09-20 DIAGNOSIS — Z79891 Long term (current) use of opiate analgesic: Secondary | ICD-10-CM | POA: Diagnosis not present

## 2021-09-20 DIAGNOSIS — G894 Chronic pain syndrome: Secondary | ICD-10-CM | POA: Diagnosis not present

## 2021-09-20 LAB — CBC WITH DIFFERENTIAL/PLATELET
Absolute Monocytes: 796 cells/uL (ref 200–950)
Basophils Absolute: 39 cells/uL (ref 0–200)
Basophils Relative: 0.5 %
Eosinophils Absolute: 39 cells/uL (ref 15–500)
Eosinophils Relative: 0.5 %
HCT: 46.4 % — ABNORMAL HIGH (ref 35.0–45.0)
Hemoglobin: 15.6 g/dL — ABNORMAL HIGH (ref 11.7–15.5)
Lymphs Abs: 3611 cells/uL (ref 850–3900)
MCH: 33 pg (ref 27.0–33.0)
MCHC: 33.6 g/dL (ref 32.0–36.0)
MCV: 98.1 fL (ref 80.0–100.0)
MPV: 9.5 fL (ref 7.5–12.5)
Monocytes Relative: 10.2 %
Neutro Abs: 3315 cells/uL (ref 1500–7800)
Neutrophils Relative %: 42.5 %
Platelets: 283 10*3/uL (ref 140–400)
RBC: 4.73 10*6/uL (ref 3.80–5.10)
RDW: 12.5 % (ref 11.0–15.0)
Total Lymphocyte: 46.3 %
WBC: 7.8 10*3/uL (ref 3.8–10.8)

## 2021-09-20 LAB — COMPLETE METABOLIC PANEL WITH GFR
AG Ratio: 1.8 (calc) (ref 1.0–2.5)
ALT: 24 U/L (ref 6–29)
AST: 20 U/L (ref 10–35)
Albumin: 4.4 g/dL (ref 3.6–5.1)
Alkaline phosphatase (APISO): 86 U/L (ref 37–153)
BUN/Creatinine Ratio: 35 (calc) — ABNORMAL HIGH (ref 6–22)
BUN: 26 mg/dL — ABNORMAL HIGH (ref 7–25)
CO2: 27 mmol/L (ref 20–32)
Calcium: 9.4 mg/dL (ref 8.6–10.4)
Chloride: 103 mmol/L (ref 98–110)
Creat: 0.74 mg/dL (ref 0.50–1.05)
Globulin: 2.5 g/dL (calc) (ref 1.9–3.7)
Glucose, Bld: 110 mg/dL — ABNORMAL HIGH (ref 65–99)
Potassium: 4.4 mmol/L (ref 3.5–5.3)
Sodium: 139 mmol/L (ref 135–146)
Total Bilirubin: 0.4 mg/dL (ref 0.2–1.2)
Total Protein: 6.9 g/dL (ref 6.1–8.1)
eGFR: 90 mL/min/{1.73_m2} (ref >=60)

## 2021-09-20 LAB — HEMOGLOBIN A1C
Hgb A1c MFr Bld: 5.4 % of total Hgb (ref <5.7)
Mean Plasma Glucose: 108 mg/dL
eAG (mmol/L): 6 mmol/L

## 2021-09-20 LAB — LIPID PANEL
Cholesterol: 171 mg/dL (ref <200)
HDL: 39 mg/dL — ABNORMAL LOW (ref >=50)
LDL Cholesterol (Calc): 96 mg/dL (calc)
Non-HDL Cholesterol (Calc): 132 mg/dL (calc) — ABNORMAL HIGH (ref <130)
Total CHOL/HDL Ratio: 4.4 (calc) (ref <5.0)
Triglycerides: 239 mg/dL — ABNORMAL HIGH (ref <150)

## 2021-09-20 LAB — TSH: TSH: 6.09 mIU/L — ABNORMAL HIGH (ref 0.40–4.50)

## 2021-09-21 ENCOUNTER — Other Ambulatory Visit: Payer: Self-pay

## 2021-09-21 ENCOUNTER — Ambulatory Visit (INDEPENDENT_AMBULATORY_CARE_PROVIDER_SITE_OTHER): Payer: 59 | Admitting: Internal Medicine

## 2021-09-21 ENCOUNTER — Encounter: Payer: Self-pay | Admitting: Internal Medicine

## 2021-09-21 VITALS — BP 122/74 | HR 83 | Temp 99.0°F | Ht 63.25 in | Wt 206.0 lb

## 2021-09-21 DIAGNOSIS — Z23 Encounter for immunization: Secondary | ICD-10-CM

## 2021-09-21 DIAGNOSIS — R7302 Impaired glucose tolerance (oral): Secondary | ICD-10-CM

## 2021-09-21 DIAGNOSIS — Z0000 Encounter for general adult medical examination without abnormal findings: Secondary | ICD-10-CM | POA: Diagnosis not present

## 2021-09-21 DIAGNOSIS — F411 Generalized anxiety disorder: Secondary | ICD-10-CM

## 2021-09-21 DIAGNOSIS — Z9889 Other specified postprocedural states: Secondary | ICD-10-CM

## 2021-09-21 DIAGNOSIS — I10 Essential (primary) hypertension: Secondary | ICD-10-CM

## 2021-09-21 DIAGNOSIS — E8881 Metabolic syndrome: Secondary | ICD-10-CM | POA: Diagnosis not present

## 2021-09-21 DIAGNOSIS — M5416 Radiculopathy, lumbar region: Secondary | ICD-10-CM

## 2021-09-21 DIAGNOSIS — F17200 Nicotine dependence, unspecified, uncomplicated: Secondary | ICD-10-CM

## 2021-09-21 DIAGNOSIS — Z6836 Body mass index (BMI) 36.0-36.9, adult: Secondary | ICD-10-CM

## 2021-09-21 DIAGNOSIS — M7918 Myalgia, other site: Secondary | ICD-10-CM

## 2021-09-21 DIAGNOSIS — Z8711 Personal history of peptic ulcer disease: Secondary | ICD-10-CM

## 2021-09-21 DIAGNOSIS — K580 Irritable bowel syndrome with diarrhea: Secondary | ICD-10-CM

## 2021-09-21 DIAGNOSIS — E785 Hyperlipidemia, unspecified: Secondary | ICD-10-CM

## 2021-09-21 DIAGNOSIS — Z8739 Personal history of other diseases of the musculoskeletal system and connective tissue: Secondary | ICD-10-CM

## 2021-09-21 DIAGNOSIS — G8929 Other chronic pain: Secondary | ICD-10-CM

## 2021-09-21 LAB — POCT URINALYSIS DIPSTICK
Bilirubin, UA: NEGATIVE
Blood, UA: NEGATIVE
Glucose, UA: NEGATIVE
Ketones, UA: NEGATIVE
Leukocytes, UA: NEGATIVE
Nitrite, UA: NEGATIVE
Protein, UA: NEGATIVE
Spec Grav, UA: 1.01 (ref 1.010–1.025)
Urobilinogen, UA: 0.2 E.U./dL
pH, UA: 5 (ref 5.0–8.0)

## 2021-09-21 NOTE — Progress Notes (Signed)
Subjective:    Patient ID: Gabriella [name], female    DOB: 1955-08-24, 66 y.o.   MRN: [id number]  HPI 66 year old Female seen for health maintenance exam and evaluation of medical issues.  She has a history of impaired glucose tolerance, obesity, osteoarthritis, hyperlipidemia, hypertension, chronic back pain and ankle pain.  She is followed at [facility] pain management for chronic pain.  History of irritable bowel syndrome treated with Bentyl.  Reminded about diabetic eye exam.  Not really able to exercise much due to chronic musculoskeletal pain.  In December 2017 had right reverse shoulder arthroplasty by Dr. Rennis Chris.  She saw Dr. Zachery Dakins at [facility] regarding chronic left ankle pain and foot a number of years ago.  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  Surgery was not advised.  Continues to smoke and has smoked for well over 30 years.  No alcohol consumption.    She was hospitalized for perforated duodenal ulcer requiring surgery in 2008.  History of debridement of anterior cruciate ligament right knee at [facility].  Family history: Mother died with complications of dementia with history of severe hypertension.  Father deceased with lung cancer.  Total of 5 brothers.  2 brothers have heart murmurs.  Has been trying to watch her weight and he is being more physically active.        Labs reviewed.  Fasting glucose is 110.  Creatinine is normal.  TSH is elevated 6.09.  Triglycerides are elevated at 239 and previously were 150 in August 2022.  Hemoglobin A1c 5.4%.  Needs Tdap -given today.  It seems that she is developing hypothyroidism with elevated TSH but she does not want to start thyroid replacement medication.  She will return in 6 months at which time TSH will be rechecked along with lipid panel and  liver functions.  Saw Dr. 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  She underwent C4-C5 and C5-C6 anterior cervical discectomy, fusion and plating July 05, 2021.  Radicular symptoms have markedly improved.  She still continues however with back pain and right leg pain.    Review of Systems mammogram done December 2022 and normal. Colonoscopy discussed.     Objective:   Physical Exam Vital signs reviewed.  Blood pressure stable at 122/74 BMI 36.20 weight 206 pounds.  Has lost 2 pounds since August 2022. Skin: Warm and dry.  No cervical adenopathy or thyromegaly.  No carotid bruits.  Cardiac exam regular rate and rhythm without ectopy.  Breast are without masses.  Chest is clear.  Abdomen obese soft nondistended without hepatosplenomegaly masses or tenderness.  No pitting edema of the lower extremities.      Assessment & Plan:  History of cervical disc disease status post surgery December 2022  History of right lumbar radiculopathy  Elevated TSH-suspect hypothyroidism but she does not want to be on thyroid replacement medication  Chronic musculoskeletal pain-treated with chronic pain medication per [facility] pain management.  Also takes Neurontin 800 mg 4 times a day.  Hyperlipidemia-he is supposed to be taking Zocor 40 mg daily.  History of gout treated with allopurinol 300 mg daily  Hypertension treated with amlodipine 10 mg daily and Lasix 40 mg daily she also takes metoprolol XL 50 mg daily and olmesartan 40  mg daily  GE reflux treated with Protonix 40 mg daily  Takes Valium for musculoskeletal pain and anxiety 5 mg every 12 hours  Plan: She does not want to consider thyroid replacement at this time but agrees to return in 6 months for follow-up.  Continue current medications.

## 2021-10-31 DIAGNOSIS — M4712 Other spondylosis with myelopathy, cervical region: Secondary | ICD-10-CM | POA: Diagnosis not present

## 2021-10-31 DIAGNOSIS — M4316 Spondylolisthesis, lumbar region: Secondary | ICD-10-CM | POA: Diagnosis not present

## 2021-10-31 DIAGNOSIS — Z6834 Body mass index (BMI) 34.0-34.9, adult: Secondary | ICD-10-CM | POA: Diagnosis not present

## 2021-10-31 DIAGNOSIS — I10 Essential (primary) hypertension: Secondary | ICD-10-CM | POA: Diagnosis not present

## 2021-10-31 DIAGNOSIS — M5412 Radiculopathy, cervical region: Secondary | ICD-10-CM | POA: Diagnosis not present

## 2021-12-08 ENCOUNTER — Other Ambulatory Visit: Payer: Self-pay | Admitting: Internal Medicine

## 2021-12-31 ENCOUNTER — Other Ambulatory Visit: Payer: Self-pay | Admitting: Internal Medicine

## 2022-02-01 ENCOUNTER — Other Ambulatory Visit: Payer: Self-pay | Admitting: Internal Medicine

## 2022-02-08 ENCOUNTER — Other Ambulatory Visit: Payer: Self-pay | Admitting: Internal Medicine

## 2022-02-12 ENCOUNTER — Other Ambulatory Visit: Payer: Self-pay | Admitting: Internal Medicine

## 2022-02-26 ENCOUNTER — Other Ambulatory Visit: Payer: Self-pay | Admitting: Internal Medicine

## 2022-03-20 ENCOUNTER — Other Ambulatory Visit: Payer: Medicare Other

## 2022-03-20 DIAGNOSIS — E785 Hyperlipidemia, unspecified: Secondary | ICD-10-CM

## 2022-03-20 DIAGNOSIS — R7302 Impaired glucose tolerance (oral): Secondary | ICD-10-CM

## 2022-03-22 ENCOUNTER — Ambulatory Visit (INDEPENDENT_AMBULATORY_CARE_PROVIDER_SITE_OTHER): Payer: Medicare Other | Admitting: Internal Medicine

## 2022-03-22 ENCOUNTER — Encounter: Payer: Self-pay | Admitting: Internal Medicine

## 2022-03-22 VITALS — BP 128/74 | HR 65 | Temp 97.9°F

## 2022-03-22 DIAGNOSIS — F17200 Nicotine dependence, unspecified, uncomplicated: Secondary | ICD-10-CM

## 2022-03-22 DIAGNOSIS — G8929 Other chronic pain: Secondary | ICD-10-CM

## 2022-03-22 DIAGNOSIS — M5416 Radiculopathy, lumbar region: Secondary | ICD-10-CM | POA: Diagnosis not present

## 2022-03-22 DIAGNOSIS — M7918 Myalgia, other site: Secondary | ICD-10-CM

## 2022-03-22 DIAGNOSIS — R7302 Impaired glucose tolerance (oral): Secondary | ICD-10-CM

## 2022-03-22 DIAGNOSIS — Z8659 Personal history of other mental and behavioral disorders: Secondary | ICD-10-CM

## 2022-03-22 DIAGNOSIS — Z8711 Personal history of peptic ulcer disease: Secondary | ICD-10-CM | POA: Diagnosis not present

## 2022-03-22 DIAGNOSIS — E781 Pure hyperglyceridemia: Secondary | ICD-10-CM

## 2022-03-22 DIAGNOSIS — I10 Essential (primary) hypertension: Secondary | ICD-10-CM | POA: Diagnosis not present

## 2022-03-22 DIAGNOSIS — E786 Lipoprotein deficiency: Secondary | ICD-10-CM

## 2022-03-22 NOTE — Patient Instructions (Addendum)
Vaccines discussed. Declines vaccines at the present time.  Labs are stable.  Needs to watch diet.  Unable to exercise much due to back pain.  Considering back surgery in the near future.  Medical issues including hypertension, hyperlipidemia, impaired glucose tolerance are stable at this time.  She is seen at [facility] pain management for chronic pain medications.

## 2022-03-22 NOTE — Progress Notes (Signed)
   Subjective:    Patient ID: Gabriella [name], female    DOB: 13-Dec-1955, 66 y.o.   MRN: [id number]  HPI 66 year old Female seen for 39-monthfollow up for hypertriglyceridemia and DM. Triglycerides still elevated at 209 but Hgb AIC  is stable and within normal limits at 5.6%.  In February 2023 hemoglobin A1c was 5.4%.  Diet discussed. Says she is trying to watch her diet.  [place] Then will be having L3-4-5 surgery by Dr. JArnoldo Moraleperhaps in October or November. Is on chronic pain management.  She says she has has chronic radiculopathy both LEs.  Liver functions were checked in connection with hypertriglyceridemia on statin therapy.  ALT is slightly elevated at 30 normal being between 6 and 29.  Alkaline phosphatase and AST are normal.  Bilirubin is normal.  Still having issues with her daughter who lives out of state and has a history of drug abuse.  Patient has history of impaired glucose tolerance, hypertension, obesity, osteoarthritis, hyperlipidemia, chronic back pain and ankle pain.  [facility].  History of irritable bowel syndrome treated with Bentyl.  In December 2017 had right reverse shoulder arthroplasty by Dr. SOnnie Graham  History of chronic left ankle pain and [facility].  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  Surgery was not advised.  Is smoked for well over 30 years.  Does not consume alcohol.  Was hospitalized for perforated duodenal ulcer requiring surgery urgently in 2008.  [facility]  Family history: Mother died with complications of dementia with history of severe hypertension.  Father  deceased with lung cancer.  Total of 5 brothers.  2 brothers have history of heart murmurs.      Review of Systems no chest pain or SOB     Objective:   Physical Exam  BP 128/74 pulse 65 regular Pulse 97% Neck is supple without JVD thyromegaly or carotid bruits.  Chest is clear to auscultation.  Cardiac exam: Regular rate and rhythm without ectopy.  Trace lower extremity edema.     Assessment & Plan:   Lumbar tradiculopathy-[facility]  History of cervical discectomy C4-C5 and C5-C6 with fusion and plating in 2022.  History of elevated TSH in February 2023.  We failed to repeat this today and she will be called back to the office.  TSH at that time was 6.09  Impaired glucose tolerance-hemoglobin A1c stable and within normal limits at 5.6%.  Hyperlipidemia treated with simvastatin 40 mg daily  Essential hypertension-stable on current regimen  History of gout treated with allopurinol  GE reflux treated with Protonix  Anxiety state treated with Valium 5 mg every 12 hours if needed for anxiety.  Plan: Her health maintenance exam is due in 6 months.  DM  HTN- stable

## 2022-03-26 ENCOUNTER — Telehealth: Payer: Self-pay | Admitting: Internal Medicine

## 2022-03-26 ENCOUNTER — Other Ambulatory Visit: Payer: Self-pay

## 2022-03-26 DIAGNOSIS — R5383 Other fatigue: Secondary | ICD-10-CM

## 2022-03-26 NOTE — Telephone Encounter (Signed)
Added TSH to existing labs to see if it could be resulted instead of patient having to come back.

## 2022-03-27 LAB — HEPATIC FUNCTION PANEL
AG Ratio: 1.6 (calc) (ref 1.0–2.5)
ALT: 30 U/L — ABNORMAL HIGH (ref 6–29)
AST: 25 U/L (ref 10–35)
Albumin: 4.3 g/dL (ref 3.6–5.1)
Alkaline phosphatase (APISO): 82 U/L (ref 37–153)
Bilirubin, Direct: 0.1 mg/dL (ref 0.0–0.2)
Globulin: 2.7 g/dL (calc) (ref 1.9–3.7)
Indirect Bilirubin: 0.4 mg/dL (calc) (ref 0.2–1.2)
Total Bilirubin: 0.5 mg/dL (ref 0.2–1.2)
Total Protein: 7 g/dL (ref 6.1–8.1)

## 2022-03-27 LAB — HEMOGLOBIN A1C
Hgb A1c MFr Bld: 5.6 % of total Hgb (ref <5.7)
Mean Plasma Glucose: 114 mg/dL
eAG (mmol/L): 6.3 mmol/L

## 2022-03-27 LAB — LIPID PANEL
Cholesterol: 147 mg/dL (ref <200)
HDL: 41 mg/dL — ABNORMAL LOW (ref >=50)
LDL Cholesterol (Calc): 76 mg/dL (calc)
Non-HDL Cholesterol (Calc): 106 mg/dL (calc) (ref <130)
Total CHOL/HDL Ratio: 3.6 (calc) (ref <5.0)
Triglycerides: 209 mg/dL — ABNORMAL HIGH (ref <150)

## 2022-03-27 LAB — TSH: TSH: 2.21 mIU/L (ref 0.40–4.50)

## 2022-04-07 ENCOUNTER — Other Ambulatory Visit: Payer: Self-pay | Admitting: Internal Medicine

## 2022-05-02 IMAGING — MG DIGITAL SCREENING BILAT W/ CAD
4 series · 4 of 4 positions shown · non-contrast
Comparison: Previous exam(s).

CLINICAL DATA: Screening.

EXAM:
DIGITAL SCREENING BILATERAL MAMMOGRAM WITH CAD

[L MLO]
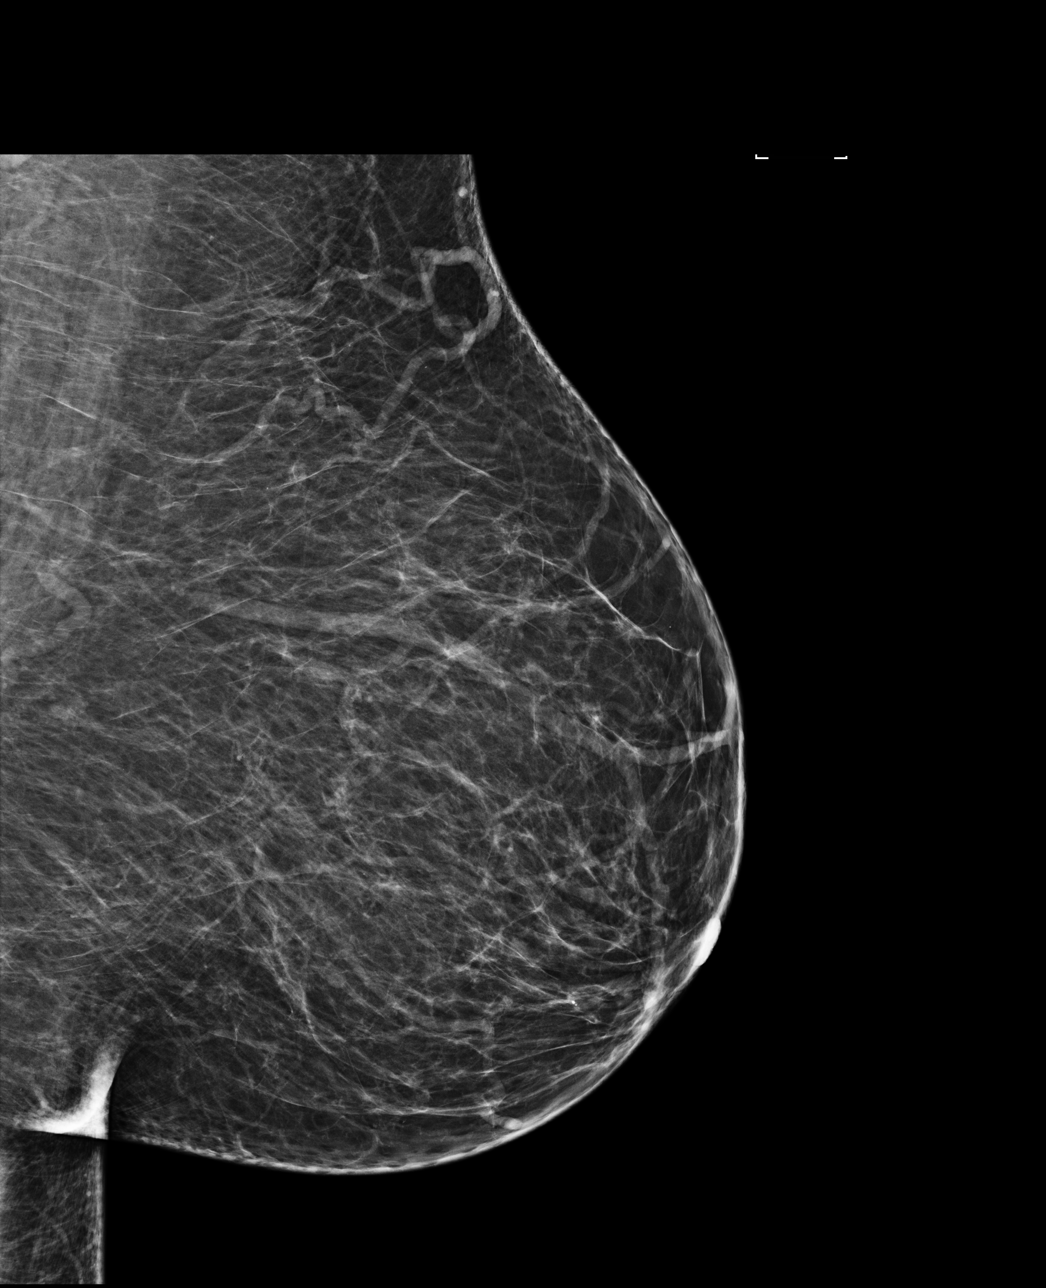

[R MLO]
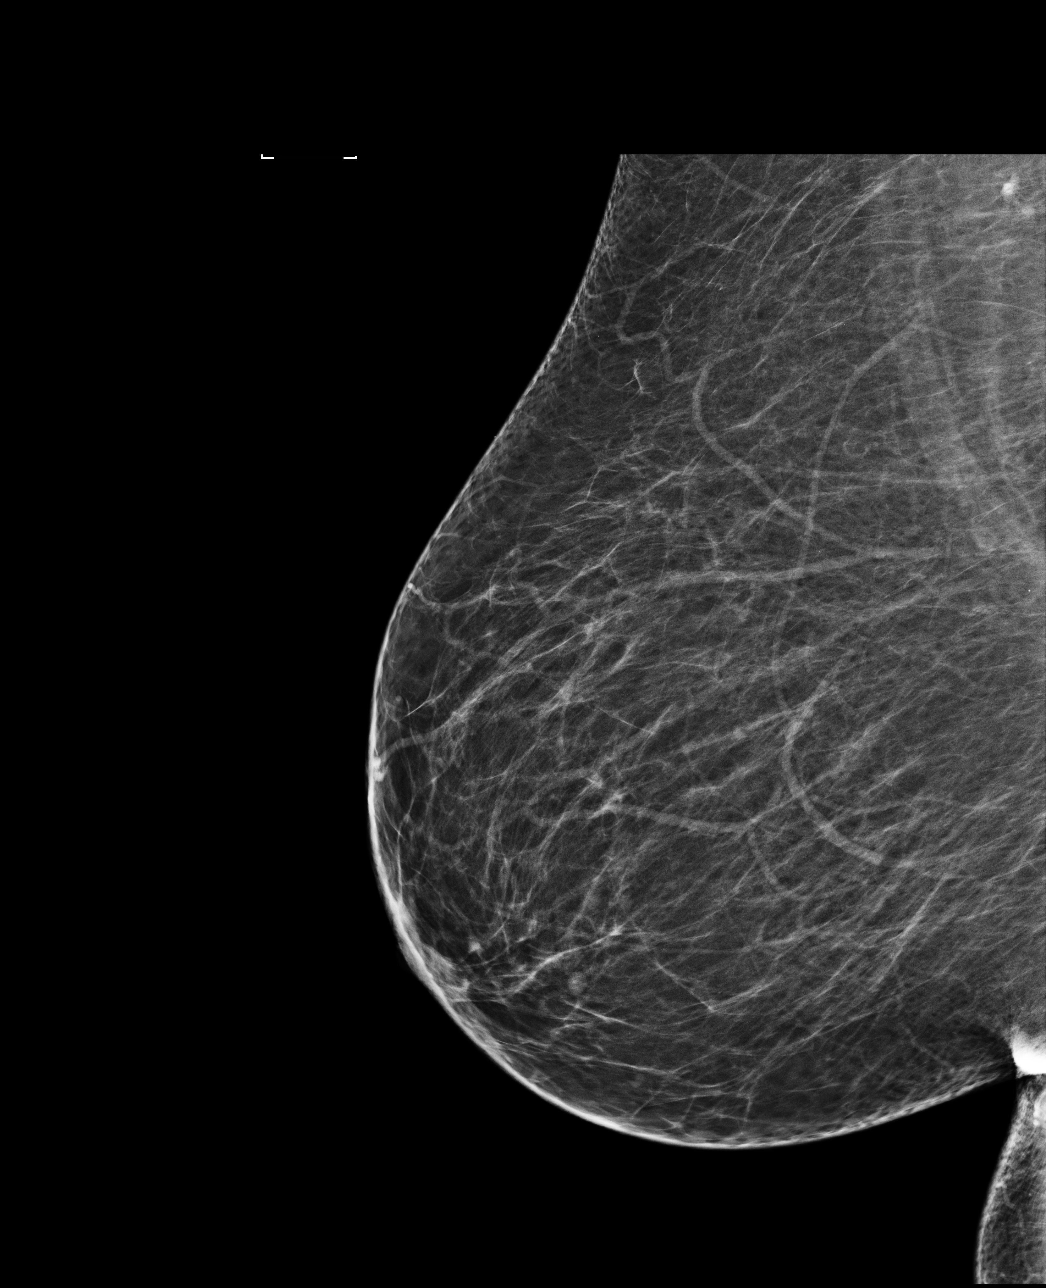

[L CC]
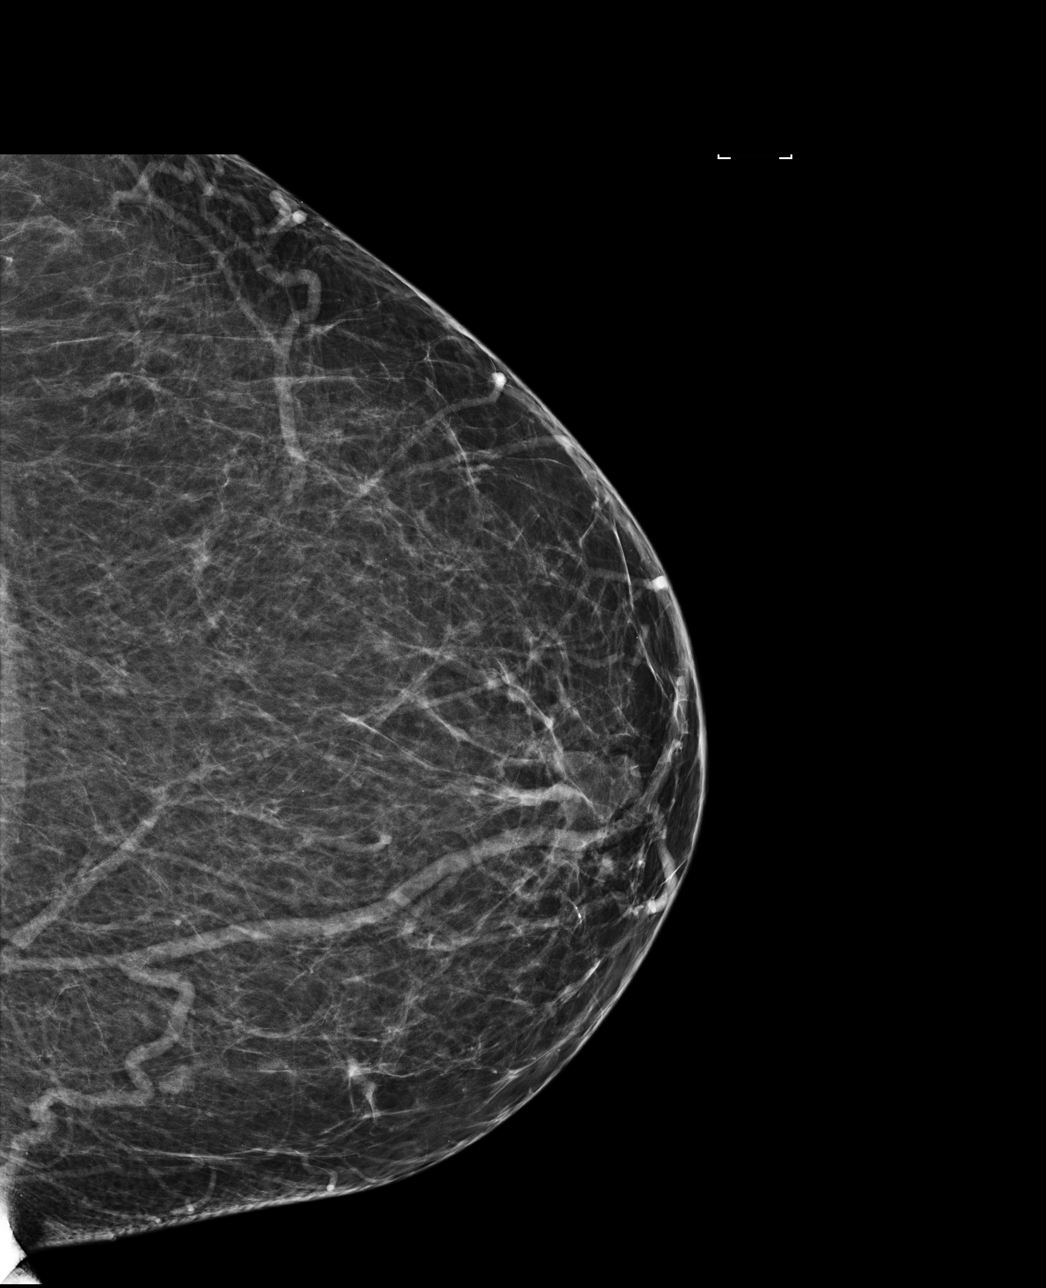

[R CC]
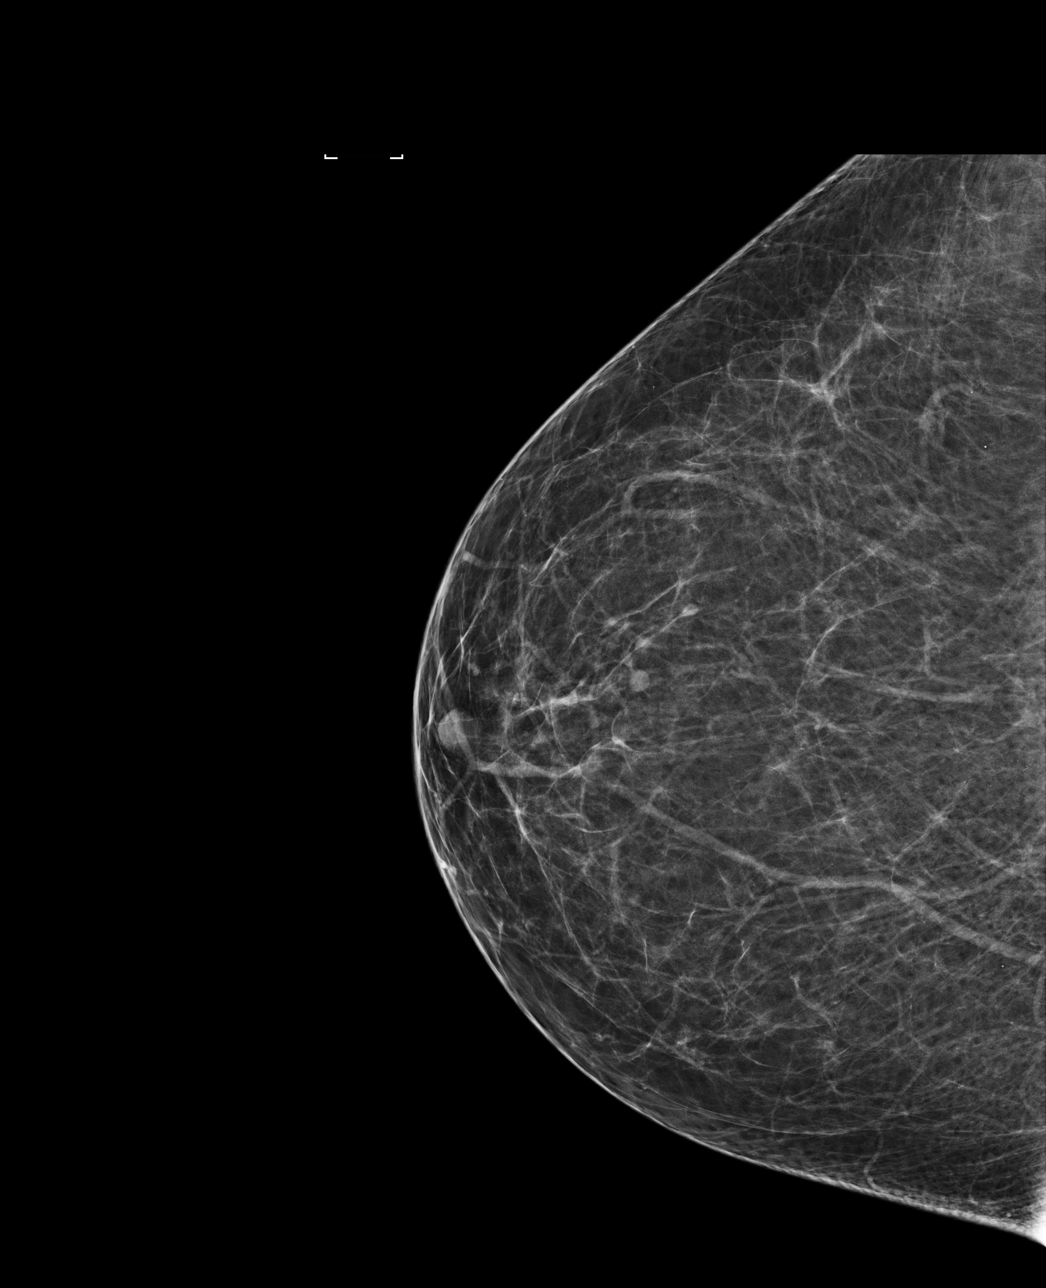

[4 of 4 positions shown; findings below may reference images not displayed]

ACR Breast Density Category b: There are scattered areas of
fibroglandular density.
FINDINGS: There are no findings suspicious for malignancy. Images were
processed with CAD.
IMPRESSION: No mammographic evidence of malignancy. A result letter of this
screening mammogram will be mailed directly to the patient.

RECOMMENDATION:
Screening mammogram in one year. (Code:AS-G-LCT)

BI-RADS CATEGORY  1: Negative.

## 2022-06-01 ENCOUNTER — Other Ambulatory Visit: Payer: Self-pay | Admitting: Student

## 2022-06-01 DIAGNOSIS — M48062 Spinal stenosis, lumbar region with neurogenic claudication: Secondary | ICD-10-CM

## 2022-06-14 ENCOUNTER — Other Ambulatory Visit: Payer: Self-pay | Admitting: Internal Medicine

## 2022-06-18 ENCOUNTER — Other Ambulatory Visit: Payer: Self-pay | Admitting: Internal Medicine

## 2022-06-18 DIAGNOSIS — Z1231 Encounter for screening mammogram for malignant neoplasm of breast: Secondary | ICD-10-CM

## 2022-06-24 ENCOUNTER — Ambulatory Visit
Admission: RE | Admit: 2022-06-24 | Discharge: 2022-06-24 | Disposition: A | Discharge status: Home / Self Care | Payer: Self-pay | Source: Ambulatory Visit | Attending: Student | Admitting: Student

## 2022-06-24 DIAGNOSIS — M48062 Spinal stenosis, lumbar region with neurogenic claudication: Secondary | ICD-10-CM

## 2022-07-04 ENCOUNTER — Other Ambulatory Visit: Payer: Self-pay | Admitting: Neurosurgery

## 2022-07-10 ENCOUNTER — Other Ambulatory Visit: Payer: Self-pay | Admitting: Internal Medicine

## 2022-07-11 ENCOUNTER — Other Ambulatory Visit: Payer: Self-pay | Admitting: Internal Medicine

## 2022-07-20 LAB — FECAL OCCULT BLOOD, IMMUNOCHEMICAL: IFOBT: NEGATIVE

## 2022-08-01 ENCOUNTER — Ambulatory Visit
Admission: RE | Admit: 2022-08-01 | Discharge: 2022-08-01 | Disposition: A | Discharge status: Home / Self Care | Payer: Medicare Other | Source: Ambulatory Visit | Attending: Internal Medicine | Admitting: Internal Medicine

## 2022-08-01 ENCOUNTER — Encounter: Payer: Self-pay | Admitting: Internal Medicine

## 2022-08-01 DIAGNOSIS — Z1231 Encounter for screening mammogram for malignant neoplasm of breast: Secondary | ICD-10-CM

## 2022-08-03 NOTE — Pre-Procedure Instructions (Signed)
Surgical Instructions    Your procedure is scheduled on Thursday, January 11.  Report to [facility] Main Entrance "A" at 5:30 A.M., then check in with the Admitting office.  Call this number if you have problems the morning of surgery:  [phone]   If you have any questions prior to your surgery date call [phone]: Open Monday-Friday 8am-4pm If you experience any cold or flu symptoms such as cough, fever, chills, shortness of breath, etc. between now and your scheduled surgery, please notify us at the above number     Remember:  Do not eat or drink after midnight the night before your surgery     Take these medicines the morning of surgery with A SIP OF WATER:  allopurinol (ZYLOPRIM)  amLODipine (NORVASC)  metoprolol succinate (TOPROL-XL) pantoprazole (PROTONIX)   cyclobenzaprine (FLEXERIL) if needed diazepam (VALIUM)  if needed gabapentin (NEURONTIN)  if needed HYDROcodone-acetaminophen [facility])  if needed  Follow your surgeon's instructions on when to stop Aspirin.  If no instructions were given by your surgeon then you will need to call the office to get those instructions.    As of today, STOP taking any (unless otherwise instructed by your surgeon) Aleve, Naproxen, Ibuprofen, Motrin, Advil, Goody's, BC's, all herbal medications, fish oil, and all vitamins.  WHAT DO I DO ABOUT MY DIABETES MEDICATION?   Do not take oral diabetes medicines (pills) the morning of surgery.   The day of surgery, do not take other diabetes injectables, including Byetta (exenatide), Bydureon (exenatide ER), Victoza (liraglutide), or Trulicity (dulaglutide).  If your CBG is greater than 220 mg/dL, you may take  of your sliding scale (correction) dose of insulin.   HOW TO MANAGE YOUR DIABETES BEFORE AND AFTER SURGERY  Why is it important to control my blood sugar before and after surgery? Improving blood sugar levels before and after surgery helps healing and can limit problems. A way  of improving blood sugar control is eating a healthy diet by:  Eating less sugar and carbohydrates  Increasing activity/exercise  Talking with your doctor about reaching your blood sugar goals High blood sugars (greater than 180 mg/dL) can raise your risk of infections and slow your recovery, so you will need to focus on controlling your diabetes during the weeks before surgery. Make sure that the doctor who takes care of your diabetes knows about your planned surgery including the date and location.  How do I manage my blood sugar before surgery? Check your blood sugar at least 4 times a day, starting 2 days before surgery, to make sure that the level is not too high or low.  Check your blood sugar the morning of your surgery when you wake up and every 2 hours until you get to the Short Stay unit.  If your blood sugar is less than 70 mg/dL, you will need to treat for low blood sugar: Do not take insulin. Treat a low blood sugar (less than 70 mg/dL) with  cup of clear juice (cranberry or apple), 4 glucose tablets, OR glucose gel. Recheck blood sugar in 15 minutes after treatment (to make sure it is greater than 70 mg/dL). If your blood sugar is not greater than 70 mg/dL on recheck, call [phone] for further instructions. Report your blood sugar to the short stay nurse when you get to Short Stay.  If you are admitted to the hospital after surgery: Your blood sugar will be checked by the staff and you will probably be given insulin after surgery (instead  of oral diabetes medicines) to make sure you have good blood sugar levels. The goal for blood sugar control after surgery is 80-180 mg/dL.   [facility] is not responsible for any belongings or valuables.    Do NOT Smoke (Tobacco/Vaping)  24 hours prior to your procedure  If you use a CPAP at night, you may bring your mask for your overnight stay.   Contacts, glasses, hearing aids, dentures or partials may not be worn into surgery,  please bring cases for these belongings   For patients admitted to the hospital, discharge time will be determined by your treatment team.   Patients discharged the day of surgery will not be allowed to drive home, and someone needs to stay with them for 24 hours.   SURGICAL WAITING ROOM VISITATION Patients having surgery or a procedure may have no more than 2 support people in the waiting area - these visitors may rotate.   Children under the age of 52 must have an adult with them who is not the patient. If the patient needs to stay at the hospital during part of their recovery, the visitor guidelines for inpatient rooms apply. Pre-op nurse will coordinate an appropriate time for 1 support person to accompany patient in pre-op.  This support person may not rotate.   Please refer to [email or web address] for the visitor guidelines for Inpatients (after your surgery is over and you are in a regular room).    Special instructions:    Oral Hygiene is also important to reduce your risk of infection.  Remember - BRUSH YOUR TEETH THE MORNING OF SURGERY WITH YOUR REGULAR TOOTHPASTE   [facility]- Preparing For Surgery  Before surgery, you can play an important role. Because skin is not sterile, your skin needs to be as free of germs as possible. You can reduce the number of germs on your skin by washing with CHG (chlorahexidine gluconate) Soap before surgery.  CHG is an antiseptic cleaner which kills germs and bonds with the skin to continue killing germs even after washing.     Please do not use if you have an allergy to CHG or antibacterial soaps. If your skin becomes reddened/irritated stop using the CHG.  Do not shave (including legs and underarms) for at least 48 hours prior to first CHG shower. It is OK to shave your face.  Please follow these instructions carefully.     Shower the NIGHT BEFORE SURGERY and the MORNING OF SURGERY with CHG  Soap.   If you chose to wash your hair, wash your hair first as usual with your normal shampoo. After you shampoo, rinse your hair and body thoroughly to remove the shampoo.  Then [employer] and genitals (private parts) with your normal soap and rinse thoroughly to remove soap.  After that Use CHG Soap as you would any other liquid soap. You can apply CHG directly to the skin and wash gently with a scrungie or a clean washcloth.   Apply the CHG Soap to your body ONLY FROM THE NECK DOWN.  Do not use on open wounds or open sores. Avoid contact with your eyes, ears, mouth and genitals (private parts). Wash Face and genitals (private parts)  with your normal soap.   Wash thoroughly, paying special attention to the area where your surgery will be performed.  Thoroughly rinse your body with warm water from the neck down.  DO NOT shower/wash with your normal soap after using and rinsing off the CHG Soap.  Pat yourself dry with a CLEAN TOWEL.  Wear CLEAN PAJAMAS to bed the night before surgery  Place CLEAN SHEETS on your bed the night before your surgery  DO NOT SLEEP WITH PETS.   Day of Surgery:  Take a shower with CHG soap. Wear Clean/Comfortable clothing the morning of surgery Do not wear jewelry or makeup. Do not wear lotions, powders, perfumes/cologne or deodorant. Do not shave 48 hours prior to surgery.  Men may shave face and neck. Do not bring valuables to the hospital. Do not wear nail polish, gel polish, artificial nails, or any other type of covering on natural nails (fingers and toes) If you have artificial nails or gel coating that need to be removed by a nail salon, please have this removed prior to surgery. Artificial nails or gel coating may interfere with anesthesia's ability to adequately monitor your vital signs. Remember to brush your teeth WITH YOUR REGULAR TOOTHPASTE.    If you received a COVID test during your pre-op visit, it is requested that you wear a mask when  out in public, stay away from anyone that may not be feeling well, and notify your surgeon if you develop symptoms. If you have been in contact with anyone that has tested positive in the last 10 days, please notify your surgeon.    Please read over the following fact sheets that you were given.

## 2022-08-06 ENCOUNTER — Encounter (HOSPITAL_COMMUNITY): Payer: Self-pay

## 2022-08-06 ENCOUNTER — Other Ambulatory Visit: Payer: Self-pay

## 2022-08-06 ENCOUNTER — Encounter (HOSPITAL_COMMUNITY)
Admission: RE | Admit: 2022-08-06 | Discharge: 2022-08-06 | Disposition: A | Discharge status: Home / Self Care | Payer: Medicare Other | Source: Ambulatory Visit | Attending: Neurosurgery | Admitting: Neurosurgery

## 2022-08-06 VITALS — BP 102/69 | HR 58 | Temp 97.9°F | Resp 18 | Ht 65.0 in | Wt 215.0 lb

## 2022-08-06 DIAGNOSIS — Z8673 Personal history of transient ischemic attack (TIA), and cerebral infarction without residual deficits: Secondary | ICD-10-CM | POA: Insufficient documentation

## 2022-08-06 DIAGNOSIS — Z720 Tobacco use: Secondary | ICD-10-CM | POA: Insufficient documentation

## 2022-08-06 DIAGNOSIS — I10 Essential (primary) hypertension: Secondary | ICD-10-CM | POA: Diagnosis not present

## 2022-08-06 DIAGNOSIS — E669 Obesity, unspecified: Secondary | ICD-10-CM | POA: Insufficient documentation

## 2022-08-06 DIAGNOSIS — Z7982 Long term (current) use of aspirin: Secondary | ICD-10-CM | POA: Diagnosis not present

## 2022-08-06 DIAGNOSIS — E785 Hyperlipidemia, unspecified: Secondary | ICD-10-CM | POA: Insufficient documentation

## 2022-08-06 DIAGNOSIS — Z01818 Encounter for other preprocedural examination: Secondary | ICD-10-CM

## 2022-08-06 HISTORY — DX: Transient cerebral ischemic attack, unspecified: G45.9

## 2022-08-06 HISTORY — DX: Unspecified osteoarthritis, unspecified site: M19.90

## 2022-08-06 LAB — BASIC METABOLIC PANEL
Anion gap: 7 (ref 5–15)
BUN: 15 mg/dL (ref 8–23)
CO2: 26 mmol/L (ref 22–32)
Calcium: 8.7 mg/dL — ABNORMAL LOW (ref 8.9–10.3)
Chloride: 106 mmol/L (ref 98–111)
Creatinine, Ser: 0.62 mg/dL (ref 0.44–1.00)
GFR, Estimated: >60 mL/min (ref >60)
Glucose, Bld: 134 mg/dL — ABNORMAL HIGH (ref 70–99)
Potassium: 3.9 mmol/L (ref 3.5–5.1)
Sodium: 139 mmol/L (ref 135–145)

## 2022-08-06 LAB — CBC
HCT: 45.8 % (ref 36.0–46.0)
Hemoglobin: 14.7 g/dL (ref 12.0–15.0)
MCH: 33.2 pg (ref 26.0–34.0)
MCHC: 32.1 g/dL (ref 30.0–36.0)
MCV: 103.4 fL — ABNORMAL HIGH (ref 80.0–100.0)
Platelets: 276 10*3/uL (ref 150–400)
RBC: 4.43 MIL/uL (ref 3.87–5.11)
RDW: 13.8 % (ref 11.5–15.5)
WBC: 6.8 10*3/uL (ref 4.0–10.5)
nRBC: 0 % (ref 0.0–0.2)

## 2022-08-06 LAB — TYPE AND SCREEN
ABO/RH(D): O POS
Antibody Screen: NEGATIVE

## 2022-08-06 LAB — SURGICAL PCR SCREEN
MRSA, PCR: NEGATIVE
Staphylococcus aureus: NEGATIVE

## 2022-08-06 NOTE — Progress Notes (Signed)
PCP - Tedra [place] Cardiologist - denies  PPM/ICD - denies   Chest x-ray - N/A EKG - 08/06/2022  Stress Test - denies ECHO - 10/06/20 Cardiac Cath - denies  Sleep Study - denies   Fasting Blood Sugar - pt reports that she used to be pre-DM, but lost 80 pounds. Patient's last Hgb A1c was 5.6. Pt states she checks her blood sugar at home still 4-5 times a week. Pt reports normal reading around 117. Pt states that anything less than 100 she feels shaky and like her blood sugar is "too low".   Last dose of GLP1 agonist-  N/A  Blood Thinner Instructions: N/A Aspirin Instructions: pt reports that she was told by her surgeon to stop taking ASA and fish oil 1 week prior to surgery.   ERAS Protcol - NPO order   COVID TEST-  N/A   Anesthesia review: review EKG  Patient denies shortness of breath, fever, cough and chest pain at PAT appointment   All instructions explained to the patient, with a verbal understanding of the material. Patient agrees to go over the instructions while at home for a better understanding. Patient also instructed to self quarantine after being tested for COVID-19. The opportunity to ask questions was provided.

## 2022-08-07 NOTE — Anesthesia Preprocedure Evaluation (Addendum)
Anesthesia Evaluation  Patient identified by MRN, date of birth, ID band Patient awake    Reviewed: Allergy & Precautions, H&P , NPO status , Patient's Chart, lab work & pertinent test results  History of Anesthesia Complications (+) PONV and history of anesthetic complications  Airway Mallampati: II  TM Distance: >3 FB Neck ROM: Full    Dental no notable dental hx. (+) Teeth Intact, Dental Advisory Given   Pulmonary Current Smoker and Patient abstained from smoking.   Pulmonary exam normal breath sounds clear to auscultation       Cardiovascular hypertension, Pt. on medications and Pt. on home beta blockers  Rhythm:Regular Rate:Normal     Neuro/Psych  Headaches  negative psych ROS   GI/Hepatic Neg liver ROS, PUD,,,  Endo/Other  diabetes  Morbid obesity  Renal/GU negative Renal ROS  negative genitourinary   Musculoskeletal  (+) Arthritis , Osteoarthritis,    Abdominal   Peds  Hematology negative hematology ROS (+)   Anesthesia Other Findings   Reproductive/Obstetrics negative OB ROS                             Anesthesia Physical Anesthesia Plan  ASA: 3  Anesthesia Plan: General   Post-op Pain Management: Tylenol PO (pre-op)* and Ketamine IV*   Induction: Intravenous  PONV Risk Score and Plan: 4 or greater and Ondansetron, Dexamethasone, Propofol infusion and Midazolam  Airway Management Planned: Oral ETT  Additional Equipment: Arterial line  Intra-op Plan:   Post-operative Plan: Extubation in OR  Informed Consent: I have reviewed the patients History and Physical, chart, labs and discussed the procedure including the risks, benefits and alternatives for the proposed anesthesia with the patient or authorized representative who has indicated his/her understanding and acceptance.     Dental advisory given  Plan Discussed with: CRNA  Anesthesia Plan Comments: (PAT note  by Karoline Caldwell, PA-C:  Evaluated by neurology in 2022 for possible TIAs. Last seen 01/23/21. Per note, "-Unclear exact etiology of spell, but no recurrent spells since initial in Jan 2022,doeshave vascular risk factorsHTN, HLD,obesity, tobacco abuse,DD included:Posterior circulation TIA, medication side effect(polypharmacy hydrocodone Valium gabapentin) -MRI of the brain was normal -MRA of the head was normal -MRA of the neck showed small shallow plaque, but no significant stenosis -Echocardiogram was normal -Recommend continue aspirin 81 mg daily, let us know of any further spells,if spell occurs, check BP, glucose;continue routine follow-up with PCP, management of vascular risk factors -Follow-up in our office on an as-needed basis."  Preop labs reviewed, unremarkable.   EKG 08/06/22: Sinus rhythm with 1st degree A-V block. Rate 60. Left axis deviation. Inferior infarct , age undetermined. Anterolateral infarct , age undetermined.  TTE 10/06/20: 1. Left ventricular ejection fraction, by estimation, is 60 to 65%. The  left ventricle has normal function. The left ventricle has no regional  wall motion abnormalities. Left ventricular diastolic parameters are  indeterminate.  2. Right ventricular systolic function is normal. The right ventricular  size is normal. Tricuspid regurgitation signal is inadequate for assessing  PA pressure.  3. The mitral valve is normal in structure. No evidence of mitral valve  regurgitation.  4. The aortic valve was not well visualized. Aortic valve regurgitation  is not visualized. No aortic stenosis is present.  5. The inferior vena cava is normal in size with greater than 50%  respiratory variability, suggesting right atrial pressure of 3 mmHg.   )  Anesthesia Quick Evaluation  

## 2022-08-07 NOTE — Progress Notes (Signed)
Anesthesia Chart Review:  Evaluated by neurology in 2022 for possible TIAs. Last seen 01/23/21. Per note, "-Unclear exact etiology of spell, but no recurrent spells since initial in Jan 2022, does have vascular risk factors HTN, HLD, obesity, tobacco abuse, DD included: Posterior circulation TIA, medication side effect (polypharmacy hydrocodone Valium gabapentin) -MRI of the brain was normal -MRA of the head was normal -MRA of the neck showed small shallow plaque, but no significant stenosis -Echocardiogram was normal -Recommend continue aspirin 81 mg daily, let us know of any further spells, if spell occurs, check BP, glucose; continue routine follow-up with PCP, management of vascular risk factors -Follow-up in our office on an as-needed basis."  Preop labs reviewed, unremarkable.   EKG 08/06/22: Sinus rhythm with 1st degree A-V block. Rate 60. Left axis deviation. Inferior infarct , age undetermined. Anterolateral infarct , age undetermined.  TTE 10/06/20:  1. Left ventricular ejection fraction, by estimation, is 60 to 65%. The  left ventricle has normal function. The left ventricle has no regional  wall motion abnormalities. Left ventricular diastolic parameters are  indeterminate.   2. Right ventricular systolic function is normal. The right ventricular  size is normal. Tricuspid regurgitation signal is inadequate for assessing  PA pressure.   3. The mitral valve is normal in structure. No evidence of mitral valve  regurgitation.   4. The aortic valve was not well visualized. Aortic valve regurgitation  is not visualized. No aortic stenosis is present.   5. The inferior vena cava is normal in size with greater than 50%  respiratory variability, suggesting right atrial pressure of 3 mmHg.     Wynonia Musty [facility] Short Stay Center/Anesthesiology Phone [phone] 08/07/2022 12:49 PM

## 2022-08-08 ENCOUNTER — Other Ambulatory Visit (HOSPITAL_COMMUNITY): Payer: Self-pay

## 2022-08-08 MED ORDER — MORPHINE SULFATE 15 MG PO TABS
15.0000 mg | ORAL_TABLET | ORAL | 0 refills | Status: DC | PRN
  Filled 2022-08-08: qty 180, 30d supply, fill #0

## 2022-08-14 ENCOUNTER — Ambulatory Visit: Payer: Medicare Other

## 2022-08-14 ENCOUNTER — Telehealth: Payer: Self-pay | Admitting: Internal Medicine

## 2022-08-14 ENCOUNTER — Encounter: Payer: Self-pay | Admitting: Internal Medicine

## 2022-08-14 ENCOUNTER — Ambulatory Visit (INDEPENDENT_AMBULATORY_CARE_PROVIDER_SITE_OTHER): Payer: Medicare Other | Admitting: Internal Medicine

## 2022-08-14 VITALS — BP 124/78 | HR 63 | Temp 98.1°F | Ht 65.0 in | Wt 211.8 lb

## 2022-08-14 DIAGNOSIS — Z8659 Personal history of other mental and behavioral disorders: Secondary | ICD-10-CM | POA: Diagnosis not present

## 2022-08-14 DIAGNOSIS — I10 Essential (primary) hypertension: Secondary | ICD-10-CM | POA: Diagnosis not present

## 2022-08-14 DIAGNOSIS — N760 Acute vaginitis: Secondary | ICD-10-CM | POA: Diagnosis not present

## 2022-08-14 DIAGNOSIS — B9689 Other specified bacterial agents as the cause of diseases classified elsewhere: Secondary | ICD-10-CM

## 2022-08-14 DIAGNOSIS — N898 Other specified noninflammatory disorders of vagina: Secondary | ICD-10-CM | POA: Diagnosis not present

## 2022-08-14 LAB — POCT WET PREP (WET MOUNT): Clue Cells Wet Prep Whiff POC: POSITIVE

## 2022-08-14 MED ORDER — METRONIDAZOLE 500 MG PO TABS: 500.0000 mg | ORAL_TABLET | Freq: Two times a day (BID) | ORAL | 0 refills | Status: AC

## 2022-08-14 NOTE — Patient Instructions (Addendum)
You have been diagnosed with Bacterial vaginosis.  Please take Flagyl (metronidazole) 500 mg twice daily for 7 days followed by a douche.  Follow-up here February 15 for health maintenance exam

## 2022-08-14 NOTE — Progress Notes (Signed)
   Subjective:    Patient ID: Humberto Leep Jamar, female    DOB: 1956/05/23, 67 y.o.   MRN: [id number]  HPI 67 year old Female with impaired glucose tolerance, obesity, osteoarthritis, hyperlipidemia, hypertension, anxiety: Chronic back pain and ankle pain requiring chronic pain management treatment seen today with complaint of vaginal discharge that is odorous.  She has upcoming health maintenance exam soon but patient felt that this problem needed prompt attention.   History of anxiety treated with as needed Valium.  History of Trichomonas vaginalis in November 2021.  Husband was treated and has had no recurrence.  Review of Systems no dysuria, fever, chills, back pain     Objective:   Physical Exam  Blood pressure 124/78, pulse 63, temperature 98.1 degrees, pulse oximetry 95%, weight 211 pounds 12.8 ounces, height 5 feet 5 inches, BMI 35.25 Skin: Warm and dry.  Patient is slightly anxious about this issue.  GU: Normal female genitalia.  There is a watery discharge that is grayish.  Wet prep is positive for clue cells.     Assessment & Plan:  Bacterial vaginosis send blood  History of anxiety treated with Valium  Essential hypertension-stable  History of impaired glucose tolerance treated with diet  Chronic musculoskeletal pain treated at Pain management clinic with hydrocodone APAP and Neurontin      Plan: Flagyl 500 mg by mouth twice daily for 7 days followed by douche x 1.  She will be seen for health maintenance exam

## 2022-08-14 NOTE — Telephone Encounter (Signed)
Gabriella [name] [phone]  [name] called to say she has vaginal odor and discharge for about a week. I schedule her to come in at 12:00

## 2022-08-29 ENCOUNTER — Other Ambulatory Visit: Payer: Self-pay | Admitting: Neurosurgery

## 2022-09-05 ENCOUNTER — Other Ambulatory Visit: Payer: Self-pay | Admitting: Internal Medicine

## 2022-09-05 ENCOUNTER — Other Ambulatory Visit (HOSPITAL_COMMUNITY): Payer: Self-pay

## 2022-09-05 MED ORDER — HYDROMORPHONE HCL 2 MG PO TABS
2.0000 mg | ORAL_TABLET | ORAL | 0 refills | Status: DC | PRN
  Filled 2022-09-05: qty 42, 7d supply, fill #0

## 2022-09-05 MED ORDER — HYDROCODONE-ACETAMINOPHEN 10-325 MG PO TABS
1.0000 | ORAL_TABLET | ORAL | 0 refills | Status: DC | PRN
  Filled 2022-09-05: qty 180, 30d supply, fill #0

## 2022-09-07 NOTE — Progress Notes (Signed)
Annual Wellness Visit    Patient Care Team: Elby Showers, MD as PCP - General  Visit Date: 09/13/22   Chief Complaint  Patient presents with   Annual Exam    Subjective:   Patient: Gabriella [name], Female    DOB: 1956/04/05, 67 y.o.   MRN: [id number]  Gabriella [name] is a 67 y.o. Female who presents today for her Annual Wellness Visit. She has a history of diabetes mellitus, hypertension, hypokalemia, obesity, dependent edema, cancer, transient ischemic attack, peptic ulcer disease, metrorrhagia, hypertriglyceridemia, headaches.  Undergoing posterior interbody lumbar fusion L23, L34, L45 on 09/20/22.   History of diabetes mellitus. HGBA1C at 6.0 on 09/11/22.  History of hypertension treated with Norvasc 10 mg daily, Toprol-XL 50 mg daily with or immediately following meal, Benicar 40 mg daily. Blood pressure at 130/80 on 09/13/22.  History of hyperlipidemia treated with Zocor 40 mg daily at bedtime. HDL low at 37 on 09/11/22, lipid panel normal otherwise.  History of dependent edema treated with Lasix 40 mg daily.  History of Vitamin A deficiency treated with Vitamin A 10,000 units daily.  History of GERD treated with Protonix 40 mg daily.  History of nerve pain treated with Neurontin 800 mg 3 times daily as needed.  History of muscle spasms treated with Flexeril 5 mg three times daily as needed.  Denies swelling in lower extremities. CBC normal.  Pap smear last completed 06/09/20. Results negative for intraepithelial lesion or malignancy Recommended repeat in 2024.   Mammogram last completed 08/01/22. Results show no mammographic evidence of malignancy. Recommended repeat in 2026.  Recently had a negative hemoccult during home Medicare wellness visit.  DEXA scan last completed 11/17/09. Results show diagnostic category is normal by WHO criteria. T-score of -2.5 or lower at the spine or hip. Recommended repeat in 2023.   Past Medical History:  Diagnosis Date    Arthritis    Cancer ([place])    FACIAL CANCER    Dependent edema    Diabetes mellitus    pt reports she was pre-DM in the past but lost weight and is no longer DM   Headache    Hypertension    Hypertriglyceridemia    Hypokalemia    Metrorrhagia    Obesity    Peptic ulcer disease    PONV (postoperative nausea and vomiting)    SUI (stress urinary incontinence, female)    TIA (transient ischemic attack)    "I might've had a TIA in summer 2022"   Tobacco abuse      Family History  Problem Relation Age of Onset   Hypertension Mother    Dementia Mother    Lung cancer Father    Breast cancer Neg Hx      Social History   Social History Narrative   Social history: This is her second marriage.  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  They have chickens and seasonal vegetables.   Continues to smoke and has smoked for well over 30 years.  No alcohol consumption. Right-handed.   2 cups caffeine daily.       Family history: Mother died with complications of dementia with history of severe hypertension.  Father deceased with lung cancer.  Time of 5 brothers.  2 brothers have heart murmurs.         Review of Systems  Constitutional:  Negative for chills, fever, malaise/fatigue and weight loss.  HENT:  Negative for hearing loss, sinus pain and sore throat.  Respiratory:  Negative for cough and hemoptysis.   Cardiovascular:  Negative for chest pain, palpitations, leg swelling and PND.  Gastrointestinal:  Negative for abdominal pain, constipation, diarrhea, heartburn, nausea and vomiting.  Genitourinary:  Negative for dysuria, frequency and urgency.  Musculoskeletal:  Negative for back pain, myalgias and neck pain.  Skin:  Negative for itching and rash.  Neurological:  Negative for dizziness, tingling, seizures and headaches.  Endo/Heme/Allergies:  Negative for polydipsia.  Psychiatric/Behavioral:  Negative for depression. The patient is not nervous/anxious.       Objective:    Vitals: BP 130/80   Pulse 73   Temp 98.2 F (36.8 C) (Tympanic)   Ht 5' 3"$  (1.6 m)   Wt 213 lb 12.8 oz (97 kg)   SpO2 97%   BMI 37.87 kg/m   Physical Exam Vitals and nursing note reviewed.  Constitutional:      General: She is not in acute distress.    Appearance: Normal appearance. She is not ill-appearing or toxic-appearing.  HENT:     Head: Normocephalic and atraumatic.     Right Ear: Hearing, tympanic membrane, ear canal and external ear normal.     Left Ear: Hearing, tympanic membrane, ear canal and external ear normal.     Mouth/Throat:     Mouth: Mucous membranes are dry.     Pharynx: Oropharynx is clear.  Eyes:     Extraocular Movements: Extraocular movements intact.     Pupils: Pupils are equal, round, and reactive to light.  Neck:     Thyroid: No thyroid mass, thyromegaly or thyroid tenderness.     Vascular: No carotid bruit.  Cardiovascular:     Rate and Rhythm: Normal rate and regular rhythm. No extrasystoles are present.    Pulses: Normal pulses.          Dorsalis pedis pulses are 2+ on the right side and 2+ on the left side.       Posterior tibial pulses are 2+ on the right side and 2+ on the left side.     Heart sounds: Normal heart sounds. No murmur heard.    No friction rub. No gallop.  Pulmonary:     Effort: Pulmonary effort is normal.     Breath sounds: Normal breath sounds. No decreased breath sounds, wheezing, rhonchi or rales.  Chest:     Chest wall: No mass.  Abdominal:     Palpations: Abdomen is soft. There is no hepatomegaly, splenomegaly or mass.     Tenderness: There is no abdominal tenderness.     Hernia: No hernia is present.  Musculoskeletal:     Cervical back: Normal range of motion.     Right lower leg: No edema.     Left lower leg: No edema.  Lymphadenopathy:     Cervical: No cervical adenopathy.     Upper Body:     Right upper body: No supraclavicular adenopathy.     Left upper body: No supraclavicular adenopathy.  Skin:     General: Skin is warm and dry.     Comments: Bilateral knee scars from ACL repairs.  Neurological:     General: No focal deficit present.     Mental Status: She is alert and oriented to person, place, and time. Mental status is at baseline.     Sensory: Sensation is intact.     Motor: Motor function is intact. No weakness.     Deep Tendon Reflexes: Reflexes are normal and symmetric.  Psychiatric:  Attention and Perception: Attention normal.        Mood and Affect: Mood normal.        Speech: Speech normal.        Behavior: Behavior normal.        Thought Content: Thought content normal.        Cognition and Memory: Cognition normal.        Judgment: Judgment normal.      Most recent functional status assessment:    09/13/2022    3:09 PM  In your present state of health, do you have any difficulty performing the following activities:  Hearing? 0  Vision? 0  Difficulty concentrating or making decisions? 0  Walking or climbing stairs? 1  Dressing or bathing? 0  Doing errands, shopping? 0  Preparing Food and eating ? N  Using the Toilet? N  In the past six months, have you accidently leaked urine? N  Do you have problems with loss of bowel control? N  Managing your Medications? N  Managing your Finances? N  Housekeeping or managing your Housekeeping? N   Most recent fall risk assessment:    09/13/2022    3:09 PM  [place] in the past year? 0  Number falls in past yr: 0  Injury with Fall? 0  Risk for fall due to : No Fall Risks  Follow up Falls prevention discussed    Most recent depression screenings:    09/13/2022    3:09 PM 08/14/2022   12:13 PM  PHQ 2/9 Scores  PHQ - 2 Score 0 0   Most recent cognitive screening:    09/13/2022    3:11 PM  6CIT Screen  What Year? 0 points  What month? 0 points  What time? 0 points  Count back from 20 0 points  Months in reverse 0 points  Repeat phrase 0 points  Total Score 0 points     Results:    Studies obtained and personally reviewed by me:  Pap smear last completed 06/09/20. Results negative for intraepithelial lesion or malignancy Recommended repeat in 2024.   Mammogram last completed 08/01/22. Results show no mammographic evidence of malignancy. Recommended repeat in 2026.  DEXA scan last completed 11/17/09. Results show diagnostic category is normal by WHO criteria. T-score of -2.5 or lower at the spine or hip. Recommended repeat in 2023.   Labs:       Component Value Date/Time   NA 144 09/11/2022 1007   K 4.3 09/11/2022 1007   CL 105 09/11/2022 1007   CO2 28 09/11/2022 1007   GLUCOSE 117 (H) 09/11/2022 1007   BUN 12 09/11/2022 1007   CREATININE 0.59 09/11/2022 1007   CALCIUM 9.5 09/11/2022 1007   PROT 6.7 09/11/2022 1007   ALBUMIN 4.5 11/06/2016 0942   AST 29 09/11/2022 1007   ALT 30 (H) 09/11/2022 1007   ALKPHOS 84 11/06/2016 0942   BILITOT 0.4 09/11/2022 1007   GFRNONAA >60 08/06/2022 1336   GFRNONAA 100 08/30/2020 1235   GFRAA 116 08/30/2020 1235     Lab Results  Component Value Date   WBC 5.5 09/11/2022   HGB 15.2 09/11/2022   HCT 44.4 09/11/2022   MCV 97.6 09/11/2022   PLT 260 09/11/2022    Lab Results  Component Value Date   CHOL 131 09/11/2022   HDL 37 (L) 09/11/2022   LDLCALC 71 09/11/2022   TRIG 144 09/11/2022   CHOLHDL 3.5 09/11/2022    Lab Results  Component Value Date   HGBA1C 6.0 (H) 09/11/2022     Lab Results  Component Value Date   TSH 0.82 09/11/2022    Assessment & Plan:   Diabetes Mellitus: She is managing with healthy diet and physical activity. HGBA1C at 6.0 on 09/11/22. Will continue to monitor.  Hypertension: treated with Norvasc 10 mg daily, Toprol-XL 50 mg daily with or immediately following meal, Benicar 40 mg daily. Blood pressure at 130/80 on 09/13/22.  Hyperlipidemia: treated with Zocor 40 mg daily at bedtime. HDL low at 37 on 09/11/22, lipid panel normal otherwise.  Dependent Edema: treated with Lasix 40 mg  daily. Denies swelling in lower extremities.  Vitamin A Deficiency: treated with Vitamin A 10,000 units daily.  GERD: treated with Protonix 40 mg daily.  Nerve Pain: treated with Neurontin 800 mg 3 times daily as needed.  Muscle Spasms: treated with Flexeril 5 mg three times daily as needed.  Health Maintenance: CBC normal.  Pap smear: Last completed 06/09/20. Results negative for intraepithelial lesion or malignancy Recommended repeat in 2024. Pelvic exam deferred today.   Mammogram: Last completed 08/01/22. Results show no mammographic evidence of malignancy. Recommended repeat in 2026.  Recently had a negative hemoccult during home Medicare wellness visit.  DEXA scan: Last completed 11/17/09. Results show diagnostic category is normal by WHO criteria. T-score of -2.5 or lower at the spine or hip. Recommended repeat in 2023.  Vaccine Counseling: Administered flu vaccine. Advised to receive pneumococcal 20 vaccine here next week ahead of her surgery. UTD on tetanus, shingles, Covid-19 vaccines.      Annual wellness visit done today including the all of the following: Reviewed patient's Family Medical History Reviewed and updated list of patient's medical providers Assessment of cognitive impairment was done Assessed patient's functional ability Established a written schedule for health screening [place] Completed and Reviewed  Discussed health benefits of physical activity, and encouraged her to engage in regular exercise appropriate for her age and condition.        I,Alexander Ruley,acting as a Education administrator for Elby Showers, MD.,have documented all relevant documentation on the behalf of Elby Showers, MD,as directed by  Elby Showers, MD while in the presence of Elby Showers, MD.  I, Elby Showers, MD, have reviewed all documentation for this visit. The documentation on 09/17/22 for the exam, diagnosis, procedures, and orders are all accurate and  complete.

## 2022-09-10 NOTE — Progress Notes (Signed)
Surgical Instructions    Your procedure is scheduled on Thursday, 09/20/22.  Report to [facility] Main Entrance "A" at 5:30 A.M., then check in with the Admitting office.  Call this number if you have problems the morning of surgery:  [phone]   If you have any questions prior to your surgery date call [phone]: Open Monday-Friday 8am-4pm If you experience any cold or flu symptoms such as cough, fever, chills, shortness of breath, etc. between now and your scheduled surgery, please notify us at the above number     Remember:  Do not eat or drink after midnight the night before your surgery     Take these medicines the morning of surgery with A SIP OF WATER:  allopurinol (ZYLOPRIM)  amLODipine (NORVASC)  diazepam (VALIUM)  metoprolol succinate (TOPROL-XL)  pantoprazole (PROTONIX)   IF NEEDED: cyclobenzaprine (FLEXERIL)  gabapentin (NEURONTIN)  HYDROcodone-acetaminophen ([place]) as prescribed morphine (MSIR) as prescribed HYDROmorphone (DILAUDID) as prescribed  As of today, STOP taking any Aspirin (unless otherwise instructed by your surgeon) Aleve, Naproxen, Ibuprofen, Motrin, Advil, Goody's, BC's, all herbal medications, fish oil, and all vitamins.  WHAT DO I DO ABOUT MY DIABETES MEDICATION?   Do not take oral diabetes medicines (pills) the morning of surgery.  The day of surgery, do not take other diabetes injectables, including Byetta (exenatide), Bydureon (exenatide ER), Victoza (liraglutide), or Trulicity (dulaglutide).  If your CBG is greater than 220 mg/dL, you may take  of your sliding scale (correction) dose of insulin.   HOW TO MANAGE YOUR DIABETES BEFORE AND AFTER SURGERY  Why is it important to control my blood sugar before and after surgery? Improving blood sugar levels before and after surgery helps healing and can limit problems. A way of improving blood sugar control is eating a healthy diet by:  Eating less sugar and carbohydrates  Increasing  activity/exercise  Talking with your doctor about reaching your blood sugar goals High blood sugars (greater than 180 mg/dL) can raise your risk of infections and slow your recovery, so you will need to focus on controlling your diabetes during the weeks before surgery. Make sure that the doctor who takes care of your diabetes knows about your planned surgery including the date and location.  How do I manage my blood sugar before surgery? Check your blood sugar at least 4 times a day, starting 2 days before surgery, to make sure that the level is not too high or low.  Check your blood sugar the morning of your surgery when you wake up and every 2 hours until you get to the Short Stay unit.  If your blood sugar is less than 70 mg/dL, you will need to treat for low blood sugar: Do not take insulin. Treat a low blood sugar (less than 70 mg/dL) with  cup of clear juice (cranberry or apple), 4 glucose tablets, OR glucose gel. Recheck blood sugar in 15 minutes after treatment (to make sure it is greater than 70 mg/dL). If your blood sugar is not greater than 70 mg/dL on recheck, call [phone] for further instructions. Report your blood sugar to the short stay nurse when you get to Short Stay.  If you are admitted to the hospital after surgery: Your blood sugar will be checked by the staff and you will probably be given insulin after surgery (instead of oral diabetes medicines) to make sure you have good blood sugar levels. The goal for blood sugar control after surgery is 80-180 mg/dL.  Do not wear jewelry or makeup. Do not wear lotions, powders, perfumes or deodorant. Do not shave 48 hours prior to surgery.   Do not bring valuables to the hospital. Do not wear nail polish, gel polish, artificial nails, or any other type of covering on natural nails (fingers and toes) If you have artificial nails or gel coating that need to be removed by a nail salon, please have this removed  prior to surgery. Artificial nails or gel coating may interfere with anesthesia's ability to adequately monitor your vital signs.  [facility] is not responsible for any belongings or valuables.    Do NOT Smoke (Tobacco/Vaping)  24 hours prior to your procedure  If you use a CPAP at night, you may bring your mask for your overnight stay.   Contacts, glasses, hearing aids, dentures or partials may not be worn into surgery, please bring cases for these belongings   For patients admitted to the hospital, discharge time will be determined by your treatment team.   Patients discharged the day of surgery will not be allowed to drive home, and someone needs to stay with them for 24 hours.   SURGICAL WAITING ROOM VISITATION Patients having surgery or a procedure may have no more than 2 support people in the waiting area - these visitors may rotate.   Children under the age of 66 must have an adult with them who is not the patient. If the patient needs to stay at the hospital during part of their recovery, the visitor guidelines for inpatient rooms apply. Pre-op nurse will coordinate an appropriate time for 1 support person to accompany patient in pre-op.  This support person may not rotate.   Please refer to [email or web address] for the visitor guidelines for Inpatients (after your surgery is over and you are in a regular room).    Special instructions:    Oral Hygiene is also important to reduce your risk of infection.  Remember - BRUSH YOUR TEETH THE MORNING OF SURGERY WITH YOUR REGULAR TOOTHPASTE   [place]- Preparing For Surgery  Before surgery, you can play an important role. Because skin is not sterile, your skin needs to be as free of germs as possible. You can reduce the number of germs on your skin by washing with CHG (chlorahexidine gluconate) Soap before surgery.  CHG is an antiseptic cleaner which kills germs and bonds with the  skin to continue killing germs even after washing.     Please do not use if you have an allergy to CHG or antibacterial soaps. If your skin becomes reddened/irritated stop using the CHG.  Do not shave (including legs and underarms) for at least 48 hours prior to first CHG shower. It is OK to shave your face.  Please follow these instructions carefully.     Shower the NIGHT BEFORE SURGERY and the MORNING OF SURGERY with CHG Soap.   If you chose to wash your hair, wash your hair first as usual with your normal shampoo. After you shampoo, rinse your hair and body thoroughly to remove the shampoo.  Then [employer] and genitals (private parts) with your normal soap and rinse thoroughly to remove soap.  After that Use CHG Soap as you would any other liquid soap. You can apply CHG directly to the skin and wash gently with a scrungie or a clean washcloth.   Apply the CHG Soap to your body ONLY FROM THE NECK DOWN.  Do not use on open wounds or open sores.  Avoid contact with your eyes, ears, mouth and genitals (private parts). Wash Face and genitals (private parts)  with your normal soap.   Wash thoroughly, paying special attention to the area where your surgery will be performed.  Thoroughly rinse your body with warm water from the neck down.  DO NOT shower/wash with your normal soap after using and rinsing off the CHG Soap.  Pat yourself dry with a CLEAN TOWEL.  Wear CLEAN PAJAMAS to bed the night before surgery  Place CLEAN SHEETS on your bed the night before your surgery  DO NOT SLEEP WITH PETS.   Day of Surgery: Take a shower with CHG soap. Wear Clean/Comfortable clothing the morning of surgery Do not apply any deodorants/lotions.   Remember to brush your teeth WITH YOUR REGULAR TOOTHPASTE.    If you received a COVID test during your pre-op visit, it is requested that you wear a mask when out in public, stay away from anyone that may not be feeling well, and notify your surgeon if  you develop symptoms. If you have been in contact with anyone that has tested positive in the last 10 days, please notify your surgeon.    Please read over the following fact sheets that you were given.

## 2022-09-11 ENCOUNTER — Encounter (HOSPITAL_COMMUNITY): Payer: Self-pay

## 2022-09-11 ENCOUNTER — Other Ambulatory Visit: Payer: Medicare Other

## 2022-09-11 ENCOUNTER — Encounter (HOSPITAL_COMMUNITY)
Admission: RE | Admit: 2022-09-11 | Discharge: 2022-09-11 | Disposition: A | Discharge status: Home / Self Care | Payer: Medicare Other | Source: Ambulatory Visit | Attending: Neurosurgery | Admitting: Neurosurgery

## 2022-09-11 ENCOUNTER — Other Ambulatory Visit: Payer: Self-pay

## 2022-09-11 VITALS — BP 129/65 | HR 66 | Temp 97.9°F | Resp 18 | Ht 65.0 in | Wt 214.0 lb

## 2022-09-11 DIAGNOSIS — E785 Hyperlipidemia, unspecified: Secondary | ICD-10-CM

## 2022-09-11 DIAGNOSIS — E119 Type 2 diabetes mellitus without complications: Secondary | ICD-10-CM | POA: Diagnosis not present

## 2022-09-11 DIAGNOSIS — Z01812 Encounter for preprocedural laboratory examination: Secondary | ICD-10-CM | POA: Insufficient documentation

## 2022-09-11 DIAGNOSIS — I10 Essential (primary) hypertension: Secondary | ICD-10-CM

## 2022-09-11 DIAGNOSIS — Z01818 Encounter for other preprocedural examination: Secondary | ICD-10-CM

## 2022-09-11 DIAGNOSIS — R5383 Other fatigue: Secondary | ICD-10-CM

## 2022-09-11 LAB — SURGICAL PCR SCREEN
MRSA, PCR: NEGATIVE
Staphylococcus aureus: NEGATIVE

## 2022-09-11 LAB — TYPE AND SCREEN
ABO/RH(D): O POS
Antibody Screen: NEGATIVE

## 2022-09-11 NOTE — Progress Notes (Signed)
Pt's surgery rescheduled d/t insurance approval. No medical/surgical history changes since last PAT appt.  Pharmacy call center called to verify meds during pt's appt.  Labs completed this AM at PCP office--not resulted yet. Will forward chart to anesthesia review pending labs.  T&S/Surgical PCR repeated today.    PCP - Tedra [place] Cardiologist - denies   PPM/ICD - denies     Chest x-ray - N/A EKG - 08/06/2022  Stress Test - denies ECHO - 10/06/20 Cardiac Cath - denies   Sleep Study - denies     Last dose of GLP1 agonist-  N/A   Blood Thinner Instructions: N/A Aspirin Instructions: pt reports that she was told by her surgeon to stop taking ASA and fish oil 1 week prior to surgery.    ERAS Protcol - NPO order    COVID TEST-  N/A    Anesthesia review: Pending labs   Patient denies shortness of breath, fever, cough and chest pain at PAT appointment     All instructions explained to the patient, with a verbal understanding of the material. Patient agrees to go over the instructions while at home for a better understanding. Patient also instructed to self quarantine after being tested for COVID-19. The opportunity to ask questions was provided.

## 2022-09-11 NOTE — Progress Notes (Signed)
Surgical Instructions    Your procedure is scheduled on Thursday, 09/20/22.  Report to [facility] Main Entrance "A" at 5:30 A.M., then check in with the Admitting office.  Call this number if you have problems the morning of surgery:  [phone]   If you have any questions prior to your surgery date call [phone]: Open Monday-Friday 8am-4pm If you experience any cold or flu symptoms such as cough, fever, chills, shortness of breath, etc. between now and your scheduled surgery, please notify us at the above number     Remember:  Do not eat or drink after midnight the night before your surgery     Take these medicines the morning of surgery with A SIP OF WATER:  allopurinol (ZYLOPRIM)  amLODipine (NORVASC)  diazepam (VALIUM)  metoprolol succinate (TOPROL-XL)  pantoprazole (PROTONIX)   IF NEEDED: cyclobenzaprine (FLEXERIL)  gabapentin (NEURONTIN)  HYDROcodone-acetaminophen ([place]) as prescribed   As of today, STOP taking any Aspirin (unless otherwise instructed by your surgeon) Aleve, Naproxen, Ibuprofen, Motrin, Advil, Goody's, BC's, all herbal medications, fish oil, and all vitamins.  WHAT DO I DO ABOUT MY DIABETES MEDICATION?   Do not take oral diabetes medicines (pills) the morning of surgery.   HOW TO MANAGE YOUR DIABETES BEFORE AND AFTER SURGERY  Why is it important to control my blood sugar before and after surgery? Improving blood sugar levels before and after surgery helps healing and can limit problems. A way of improving blood sugar control is eating a healthy diet by:  Eating less sugar and carbohydrates  Increasing activity/exercise  Talking with your doctor about reaching your blood sugar goals High blood sugars (greater than 180 mg/dL) can raise your risk of infections and slow your recovery, so you will need to focus on controlling your diabetes during the weeks before surgery. Make sure that the doctor who takes care of your diabetes knows about your  planned surgery including the date and location.  How do I manage my blood sugar before surgery? Check your blood sugar at least 4 times a day, starting 2 days before surgery, to make sure that the level is not too high or low.  Check your blood sugar the morning of your surgery when you wake up and every 2 hours until you get to the Short Stay unit.  If your blood sugar is less than 70 mg/dL, you will need to treat for low blood sugar: Do not take insulin. Treat a low blood sugar (less than 70 mg/dL) with  cup of clear juice (cranberry or apple), 4 glucose tablets, OR glucose gel. Recheck blood sugar in 15 minutes after treatment (to make sure it is greater than 70 mg/dL). If your blood sugar is not greater than 70 mg/dL on recheck, call [phone] for further instructions. Report your blood sugar to the short stay nurse when you get to Short Stay.  If you are admitted to the hospital after surgery: Your blood sugar will be checked by the staff and you will probably be given insulin after surgery (instead of oral diabetes medicines) to make sure you have good blood sugar levels. The goal for blood sugar control after surgery is 80-180 mg/dL.           Do not wear jewelry or makeup. Do not wear lotions, powders, perfumes or deodorant. Do not shave 48 hours prior to surgery.   Do not bring valuables to the hospital. Do not wear nail polish, gel polish, artificial nails, or any other type of  covering on natural nails (fingers and toes) If you have artificial nails or gel coating that need to be removed by a nail salon, please have this removed prior to surgery. Artificial nails or gel coating may interfere with anesthesia's ability to adequately monitor your vital signs.  [facility] is not responsible for any belongings or valuables.    Do NOT Smoke (Tobacco/Vaping)  24 hours prior to your procedure  If you use a CPAP at night, you may bring your mask for your overnight stay.    Contacts, glasses, hearing aids, dentures or partials may not be worn into surgery, please bring cases for these belongings   For patients admitted to the hospital, discharge time will be determined by your treatment team.   Patients discharged the day of surgery will not be allowed to drive home, and someone needs to stay with them for 24 hours.   SURGICAL WAITING ROOM VISITATION Patients having surgery or a procedure may have no more than 2 support people in the waiting area - these visitors may rotate.   Children under the age of 70 must have an adult with them who is not the patient. If the patient needs to stay at the hospital during part of their recovery, the visitor guidelines for inpatient rooms apply. Pre-op nurse will coordinate an appropriate time for 1 support person to accompany patient in pre-op.  This support person may not rotate.   Please refer to [email or web address] for the visitor guidelines for Inpatients (after your surgery is over and you are in a regular room).    Special instructions:    Oral Hygiene is also important to reduce your risk of infection.  Remember - BRUSH YOUR TEETH THE MORNING OF SURGERY WITH YOUR REGULAR TOOTHPASTE   [place]- Preparing For Surgery  Before surgery, you can play an important role. Because skin is not sterile, your skin needs to be as free of germs as possible. You can reduce the number of germs on your skin by washing with CHG (chlorahexidine gluconate) Soap before surgery.  CHG is an antiseptic cleaner which kills germs and bonds with the skin to continue killing germs even after washing.     Please do not use if you have an allergy to CHG or antibacterial soaps. If your skin becomes reddened/irritated stop using the CHG.  Do not shave (including legs and underarms) for at least 48 hours prior to first CHG shower. It is OK to shave your face.  Please follow these instructions  carefully.     Shower the NIGHT BEFORE SURGERY and the MORNING OF SURGERY with CHG Soap.   If you chose to wash your hair, wash your hair first as usual with your normal shampoo. After you shampoo, rinse your hair and body thoroughly to remove the shampoo.  Then [employer] and genitals (private parts) with your normal soap and rinse thoroughly to remove soap.  After that Use CHG Soap as you would any other liquid soap. You can apply CHG directly to the skin and wash gently with a scrungie or a clean washcloth.   Apply the CHG Soap to your body ONLY FROM THE NECK DOWN.  Do not use on open wounds or open sores. Avoid contact with your eyes, ears, mouth and genitals (private parts). Wash Face and genitals (private parts)  with your normal soap.   Wash thoroughly, paying special attention to the area where your surgery will be performed.  Thoroughly rinse your body with warm  water from the neck down.  DO NOT shower/wash with your normal soap after using and rinsing off the CHG Soap.  Pat yourself dry with a CLEAN TOWEL.  Wear CLEAN PAJAMAS to bed the night before surgery  Place CLEAN SHEETS on your bed the night before your surgery  DO NOT SLEEP WITH PETS.   Day of Surgery: Take a shower with CHG soap. Wear Clean/Comfortable clothing the morning of surgery Do not apply any deodorants/lotions.   Remember to brush your teeth WITH YOUR REGULAR TOOTHPASTE.    If you received a COVID test during your pre-op visit, it is requested that you wear a mask when out in public, stay away from anyone that may not be feeling well, and notify your surgeon if you develop symptoms. If you have been in contact with anyone that has tested positive in the last 10 days, please notify your surgeon.    Please read over the following fact sheets that you were given.

## 2022-09-12 LAB — LIPID PANEL
Cholesterol: 131 mg/dL (ref <200)
HDL: 37 mg/dL — ABNORMAL LOW (ref >=50)
LDL Cholesterol (Calc): 71 mg/dL (calc)
Non-HDL Cholesterol (Calc): 94 mg/dL (calc) (ref <130)
Total CHOL/HDL Ratio: 3.5 (calc) (ref <5.0)
Triglycerides: 144 mg/dL (ref <150)

## 2022-09-12 LAB — CBC WITH DIFFERENTIAL/PLATELET
Absolute Monocytes: 523 cells/uL (ref 200–950)
Basophils Absolute: 33 cells/uL (ref 0–200)
Basophils Relative: 0.6 %
Eosinophils Absolute: 22 cells/uL (ref 15–500)
Eosinophils Relative: 0.4 %
HCT: 44.4 % (ref 35.0–45.0)
Hemoglobin: 15.2 g/dL (ref 11.7–15.5)
Lymphs Abs: 1898 cells/uL (ref 850–3900)
MCH: 33.4 pg — ABNORMAL HIGH (ref 27.0–33.0)
MCHC: 34.2 g/dL (ref 32.0–36.0)
MCV: 97.6 fL (ref 80.0–100.0)
MPV: 9.6 fL (ref 7.5–12.5)
Monocytes Relative: 9.5 %
Neutro Abs: 3025 cells/uL (ref 1500–7800)
Neutrophils Relative %: 55 %
Platelets: 260 10*3/uL (ref 140–400)
RBC: 4.55 10*6/uL (ref 3.80–5.10)
RDW: 12.8 % (ref 11.0–15.0)
Total Lymphocyte: 34.5 %
WBC: 5.5 10*3/uL (ref 3.8–10.8)

## 2022-09-12 LAB — COMPLETE METABOLIC PANEL WITH GFR
AG Ratio: 1.4 (calc) (ref 1.0–2.5)
ALT: 30 U/L — ABNORMAL HIGH (ref 6–29)
AST: 29 U/L (ref 10–35)
Albumin: 3.9 g/dL (ref 3.6–5.1)
Alkaline phosphatase (APISO): 76 U/L (ref 37–153)
BUN: 12 mg/dL (ref 7–25)
CO2: 28 mmol/L (ref 20–32)
Calcium: 9.5 mg/dL (ref 8.6–10.4)
Chloride: 105 mmol/L (ref 98–110)
Creat: 0.59 mg/dL (ref 0.50–1.05)
Globulin: 2.8 g/dL (calc) (ref 1.9–3.7)
Glucose, Bld: 117 mg/dL — ABNORMAL HIGH (ref 65–99)
Potassium: 4.3 mmol/L (ref 3.5–5.3)
Sodium: 144 mmol/L (ref 135–146)
Total Bilirubin: 0.4 mg/dL (ref 0.2–1.2)
Total Protein: 6.7 g/dL (ref 6.1–8.1)
eGFR: 100 mL/min/{1.73_m2} (ref >=60)

## 2022-09-12 LAB — TSH: TSH: 0.82 mIU/L (ref 0.40–4.50)

## 2022-09-12 LAB — HEMOGLOBIN A1C
Hgb A1c MFr Bld: 6 % of total Hgb — ABNORMAL HIGH (ref <5.7)
Mean Plasma Glucose: 126 mg/dL
eAG (mmol/L): 7 mmol/L

## 2022-09-12 LAB — MICROALBUMIN / CREATININE URINE RATIO
Creatinine, Urine: 45 mg/dL (ref 20–275)
Microalb Creat Ratio: 96 mcg/mg creat — ABNORMAL HIGH (ref <30)
Microalb, Ur: 4.3 mg/dL

## 2022-09-12 LAB — GLUCOSE, CAPILLARY: Glucose-Capillary: 130 mg/dL — ABNORMAL HIGH (ref 70–99)

## 2022-09-13 ENCOUNTER — Encounter: Payer: Self-pay | Admitting: Internal Medicine

## 2022-09-13 ENCOUNTER — Ambulatory Visit (INDEPENDENT_AMBULATORY_CARE_PROVIDER_SITE_OTHER): Payer: Medicare Other | Admitting: Internal Medicine

## 2022-09-13 VITALS — BP 130/80 | HR 73 | Temp 98.2°F | Ht 63.0 in | Wt 213.8 lb

## 2022-09-13 DIAGNOSIS — Z23 Encounter for immunization: Secondary | ICD-10-CM

## 2022-09-13 DIAGNOSIS — M5416 Radiculopathy, lumbar region: Secondary | ICD-10-CM | POA: Diagnosis not present

## 2022-09-13 DIAGNOSIS — Z0000 Encounter for general adult medical examination without abnormal findings: Secondary | ICD-10-CM

## 2022-09-13 DIAGNOSIS — R7302 Impaired glucose tolerance (oral): Secondary | ICD-10-CM

## 2022-09-13 DIAGNOSIS — M7918 Myalgia, other site: Secondary | ICD-10-CM

## 2022-09-13 DIAGNOSIS — E786 Lipoprotein deficiency: Secondary | ICD-10-CM

## 2022-09-13 DIAGNOSIS — Z8711 Personal history of peptic ulcer disease: Secondary | ICD-10-CM | POA: Diagnosis not present

## 2022-09-13 DIAGNOSIS — E781 Pure hyperglyceridemia: Secondary | ICD-10-CM

## 2022-09-13 DIAGNOSIS — I10 Essential (primary) hypertension: Secondary | ICD-10-CM | POA: Diagnosis not present

## 2022-09-13 DIAGNOSIS — Z8659 Personal history of other mental and behavioral disorders: Secondary | ICD-10-CM | POA: Diagnosis not present

## 2022-09-13 DIAGNOSIS — F17200 Nicotine dependence, unspecified, uncomplicated: Secondary | ICD-10-CM

## 2022-09-13 DIAGNOSIS — Z6837 Body mass index (BMI) 37.0-37.9, adult: Secondary | ICD-10-CM

## 2022-09-13 DIAGNOSIS — G8929 Other chronic pain: Secondary | ICD-10-CM

## 2022-09-13 DIAGNOSIS — Z9889 Other specified postprocedural states: Secondary | ICD-10-CM

## 2022-09-13 DIAGNOSIS — Z8739 Personal history of other diseases of the musculoskeletal system and connective tissue: Secondary | ICD-10-CM

## 2022-09-13 LAB — POCT URINALYSIS DIPSTICK
Bilirubin, UA: NEGATIVE
Blood, UA: NEGATIVE
Glucose, UA: NEGATIVE
Ketones, UA: NEGATIVE
Leukocytes, UA: NEGATIVE
Nitrite, UA: NEGATIVE
Protein, UA: POSITIVE
Spec Grav, UA: 1.01
Urobilinogen, UA: 0.2 U/dL
pH, UA: 7.5

## 2022-09-17 ENCOUNTER — Ambulatory Visit (INDEPENDENT_AMBULATORY_CARE_PROVIDER_SITE_OTHER): Payer: Medicare Other

## 2022-09-17 VITALS — BP 138/80 | HR 86 | Temp 97.8°F | Ht 63.0 in | Wt 213.0 lb

## 2022-09-17 DIAGNOSIS — Z23 Encounter for immunization: Secondary | ICD-10-CM

## 2022-09-17 NOTE — Patient Instructions (Addendum)
Cleared for upcoming lumbar surgery. Medical issues are stable.Has received flu and pneumococcal vaccines this month. Return here in 6 months.

## 2022-09-20 ENCOUNTER — Other Ambulatory Visit: Payer: Self-pay

## 2022-09-20 ENCOUNTER — Inpatient Hospital Stay (HOSPITAL_COMMUNITY)
Admission: RE | Admit: 2022-09-20 | Discharge: 2022-09-26 | DRG: 454 | Disposition: A | Discharge status: Home Health | Payer: Medicare Other | Attending: Neurosurgery | Admitting: Neurosurgery

## 2022-09-20 ENCOUNTER — Inpatient Hospital Stay (HOSPITAL_COMMUNITY): Payer: Medicare Other

## 2022-09-20 ENCOUNTER — Inpatient Hospital Stay (HOSPITAL_COMMUNITY): Payer: Medicare Other | Admitting: Anesthesiology

## 2022-09-20 ENCOUNTER — Inpatient Hospital Stay (HOSPITAL_COMMUNITY): Payer: Medicare Other | Admitting: Physician Assistant

## 2022-09-20 ENCOUNTER — Inpatient Hospital Stay (HOSPITAL_COMMUNITY)
Admission: RE | Disposition: A | Discharge status: Home Health | Payer: Self-pay | Source: Home / Self Care | Attending: Neurosurgery

## 2022-09-20 ENCOUNTER — Encounter (HOSPITAL_COMMUNITY): Payer: Self-pay | Admitting: Neurosurgery

## 2022-09-20 DIAGNOSIS — M5116 Intervertebral disc disorders with radiculopathy, lumbar region: Secondary | ICD-10-CM | POA: Diagnosis present

## 2022-09-20 DIAGNOSIS — I10 Essential (primary) hypertension: Secondary | ICD-10-CM | POA: Diagnosis present

## 2022-09-20 DIAGNOSIS — Z79899 Other long term (current) drug therapy: Secondary | ICD-10-CM

## 2022-09-20 DIAGNOSIS — E781 Pure hyperglyceridemia: Secondary | ICD-10-CM | POA: Diagnosis present

## 2022-09-20 DIAGNOSIS — G9741 Accidental puncture or laceration of dura during a procedure: Secondary | ICD-10-CM | POA: Diagnosis not present

## 2022-09-20 DIAGNOSIS — M4156 Other secondary scoliosis, lumbar region: Secondary | ICD-10-CM | POA: Diagnosis present

## 2022-09-20 DIAGNOSIS — Z8711 Personal history of peptic ulcer disease: Secondary | ICD-10-CM | POA: Diagnosis not present

## 2022-09-20 DIAGNOSIS — M4310 Spondylolisthesis, site unspecified: Principal | ICD-10-CM

## 2022-09-20 DIAGNOSIS — M4316 Spondylolisthesis, lumbar region: Secondary | ICD-10-CM | POA: Diagnosis present

## 2022-09-20 DIAGNOSIS — Z6837 Body mass index (BMI) 37.0-37.9, adult: Secondary | ICD-10-CM

## 2022-09-20 DIAGNOSIS — Z8249 Family history of ischemic heart disease and other diseases of the circulatory system: Secondary | ICD-10-CM | POA: Diagnosis not present

## 2022-09-20 DIAGNOSIS — Z801 Family history of malignant neoplasm of trachea, bronchus and lung: Secondary | ICD-10-CM

## 2022-09-20 DIAGNOSIS — Z7982 Long term (current) use of aspirin: Secondary | ICD-10-CM | POA: Diagnosis not present

## 2022-09-20 DIAGNOSIS — Y838 Other surgical procedures as the cause of abnormal reaction of the patient, or of later complication, without mention of misadventure at the time of the procedure: Secondary | ICD-10-CM | POA: Diagnosis not present

## 2022-09-20 DIAGNOSIS — M48062 Spinal stenosis, lumbar region with neurogenic claudication: Secondary | ICD-10-CM | POA: Diagnosis present

## 2022-09-20 DIAGNOSIS — F17210 Nicotine dependence, cigarettes, uncomplicated: Secondary | ICD-10-CM | POA: Diagnosis present

## 2022-09-20 DIAGNOSIS — E119 Type 2 diabetes mellitus without complications: Secondary | ICD-10-CM

## 2022-09-20 DIAGNOSIS — Z8673 Personal history of transient ischemic attack (TIA), and cerebral infarction without residual deficits: Secondary | ICD-10-CM

## 2022-09-20 DIAGNOSIS — M1990 Unspecified osteoarthritis, unspecified site: Secondary | ICD-10-CM | POA: Diagnosis present

## 2022-09-20 LAB — POCT I-STAT 7, (LYTES, BLD GAS, ICA,H+H)
Acid-Base Excess: 0 mmol/L (ref 0.0–2.0)
Acid-base deficit: 5 mmol/L — ABNORMAL HIGH (ref 0.0–2.0)
Acid-base deficit: 3 mmol/L — ABNORMAL HIGH (ref 0.0–2.0)
Bicarbonate: 20.2 mmol/L (ref 20.0–28.0)
Bicarbonate: 24.9 mmol/L (ref 20.0–28.0)
Bicarbonate: 23.1 mmol/L (ref 20.0–28.0)
Calcium, Ion: 1.03 mmol/L — ABNORMAL LOW (ref 1.15–1.40)
Calcium, Ion: 1.01 mmol/L — ABNORMAL LOW (ref 1.15–1.40)
Calcium, Ion: 1.03 mmol/L — ABNORMAL LOW (ref 1.15–1.40)
HCT: 34 % — ABNORMAL LOW (ref 36.0–46.0)
HCT: 35 % — ABNORMAL LOW (ref 36.0–46.0)
HCT: 37 % (ref 36.0–46.0)
Hemoglobin: 11.6 g/dL — ABNORMAL LOW (ref 12.0–15.0)
Hemoglobin: 11.9 g/dL — ABNORMAL LOW (ref 12.0–15.0)
Hemoglobin: 12.6 g/dL (ref 12.0–15.0)
O2 Saturation: 100 %
O2 Saturation: 100 %
O2 Saturation: 100 %
Patient temperature: 35.8
Patient temperature: 36
Patient temperature: 36.1
Potassium: 2.9 mmol/L — ABNORMAL LOW (ref 3.5–5.1)
Potassium: 3.7 mmol/L (ref 3.5–5.1)
Potassium: 4 mmol/L (ref 3.5–5.1)
Sodium: 143 mmol/L (ref 135–145)
Sodium: 140 mmol/L (ref 135–145)
Sodium: 140 mmol/L (ref 135–145)
TCO2: 21 mmol/L — ABNORMAL LOW (ref 22–32)
TCO2: 26 mmol/L (ref 22–32)
TCO2: 24 mmol/L (ref 22–32)
pCO2 arterial: 35.2 mmHg (ref 32–48)
pCO2 arterial: 39.6 mmHg (ref 32–48)
pCO2 arterial: 40.7 mmHg (ref 32–48)
pH, Arterial: 7.362 (ref 7.35–7.45)
pH, Arterial: 7.402 (ref 7.35–7.45)
pH, Arterial: 7.359 (ref 7.35–7.45)
pO2, Arterial: 215 mmHg — ABNORMAL HIGH (ref 83–108)
pO2, Arterial: 202 mmHg — ABNORMAL HIGH (ref 83–108)
pO2, Arterial: 193 mmHg — ABNORMAL HIGH (ref 83–108)

## 2022-09-20 LAB — GLUCOSE, CAPILLARY
Glucose-Capillary: 93 mg/dL (ref 70–99)
Glucose-Capillary: 177 mg/dL — ABNORMAL HIGH (ref 70–99)

## 2022-09-20 SURGERY — POSTERIOR LUMBAR FUSION 3 LEVEL
Anesthesia: General

## 2022-09-20 MED ORDER — HYDROMORPHONE HCL 1 MG/ML IJ SOLN
0.2500 mg | INTRAMUSCULAR | Status: DC | PRN
  Administered 2022-09-20 (×2): 0.5 mg via INTRAVENOUS
  Administered 2022-09-20 (×2): 0.25 mg via INTRAVENOUS

## 2022-09-20 MED ORDER — PHENYLEPHRINE 80 MCG/ML (10ML) SYRINGE FOR IV PUSH (FOR BLOOD PRESSURE SUPPORT)
PREFILLED_SYRINGE | INTRAVENOUS | Status: AC
  Filled 2022-09-20: qty 10

## 2022-09-20 MED ORDER — EPHEDRINE 5 MG/ML INJ
INTRAVENOUS | Status: AC
  Filled 2022-09-20: qty 5

## 2022-09-20 MED ORDER — ACETAMINOPHEN 500 MG PO TABS
1000.0000 mg | ORAL_TABLET | Freq: Four times a day (QID) | ORAL | Status: AC
  Administered 2022-09-20 – 2022-09-21 (×4): 1000 mg via ORAL
  Filled 2022-09-20 (×4): qty 2

## 2022-09-20 MED ORDER — THROMBIN 5000 UNITS EX SOLR
CUTANEOUS | Status: AC
  Filled 2022-09-20: qty 5000

## 2022-09-20 MED ORDER — LACTATED RINGERS IV SOLN: INTRAVENOUS | Status: DC

## 2022-09-20 MED ORDER — BUPIVACAINE-EPINEPHRINE (PF) 0.25% -1:200000 IJ SOLN
INTRAMUSCULAR | Status: AC
  Filled 2022-09-20: qty 30

## 2022-09-20 MED ORDER — MENTHOL 3 MG MT LOZG: 1.0000 | LOZENGE | OROMUCOSAL | Status: DC | PRN

## 2022-09-20 MED ORDER — SODIUM CHLORIDE 0.9% FLUSH
3.0000 mL | Freq: Two times a day (BID) | INTRAVENOUS | Status: DC
  Administered 2022-09-20 – 2022-09-26 (×12): 3 mL via INTRAVENOUS

## 2022-09-20 MED ORDER — POTASSIUM CHLORIDE CRYS ER 20 MEQ PO TBCR
20.0000 meq | EXTENDED_RELEASE_TABLET | Freq: Three times a day (TID) | ORAL | Status: DC
  Administered 2022-09-21 – 2022-09-26 (×16): 20 meq via ORAL
  Filled 2022-09-20 (×16): qty 1

## 2022-09-20 MED ORDER — SUGAMMADEX SODIUM 200 MG/2ML IV SOLN
INTRAVENOUS | Status: DC | PRN
  Administered 2022-09-20: 400 mg via INTRAVENOUS

## 2022-09-20 MED ORDER — HYDROMORPHONE HCL 2 MG PO TABS
2.0000 mg | ORAL_TABLET | ORAL | Status: DC | PRN
  Administered 2022-09-20: 2 mg via ORAL
  Administered 2022-09-21 – 2022-09-23 (×7): 4 mg via ORAL
  Administered 2022-09-23: 2 mg via ORAL
  Administered 2022-09-23 – 2022-09-25 (×3): 4 mg via ORAL
  Filled 2022-09-20 (×6): qty 2
  Filled 2022-09-20 (×2): qty 1
  Filled 2022-09-20 (×4): qty 2

## 2022-09-20 MED ORDER — SODIUM CHLORIDE 0.9% FLUSH: 3.0000 mL | INTRAVENOUS | Status: DC | PRN

## 2022-09-20 MED ORDER — THROMBIN 5000 UNITS EX SOLR
OROMUCOSAL | Status: DC | PRN
  Administered 2022-09-20 (×3): 5 mL via TOPICAL

## 2022-09-20 MED ORDER — GABAPENTIN 400 MG PO CAPS
800.0000 mg | ORAL_CAPSULE | Freq: Every day | ORAL | Status: DC
  Administered 2022-09-21 – 2022-09-26 (×6): 800 mg via ORAL
  Filled 2022-09-20 (×6): qty 2

## 2022-09-20 MED ORDER — IRBESARTAN 300 MG PO TABS
300.0000 mg | ORAL_TABLET | Freq: Every day | ORAL | Status: DC
  Administered 2022-09-21 – 2022-09-26 (×6): 300 mg via ORAL
  Filled 2022-09-20 (×6): qty 1

## 2022-09-20 MED ORDER — SODIUM CHLORIDE 0.9 % IV SOLN
250.0000 mL | INTRAVENOUS | Status: DC
  Administered 2022-09-20: 250 mL via INTRAVENOUS

## 2022-09-20 MED ORDER — POTASSIUM CHLORIDE 10 MEQ/100ML IV SOLN
INTRAVENOUS | Status: DC | PRN
  Administered 2022-09-20 (×2): 10 meq via INTRAVENOUS

## 2022-09-20 MED ORDER — ALLOPURINOL 300 MG PO TABS
300.0000 mg | ORAL_TABLET | Freq: Every day | ORAL | Status: DC
  Administered 2022-09-21 – 2022-09-26 (×6): 300 mg via ORAL
  Filled 2022-09-20 (×6): qty 1

## 2022-09-20 MED ORDER — 0.9 % SODIUM CHLORIDE (POUR BTL) OPTIME
TOPICAL | Status: DC | PRN
  Administered 2022-09-20 (×2): 1000 mL

## 2022-09-20 MED ORDER — ALBUMIN HUMAN 5 % IV SOLN: INTRAVENOUS | Status: DC | PRN

## 2022-09-20 MED ORDER — MORPHINE SULFATE (PF) 4 MG/ML IV SOLN
4.0000 mg | INTRAVENOUS | Status: DC | PRN
  Administered 2022-09-21 – 2022-09-22 (×4): 4 mg via INTRAVENOUS
  Filled 2022-09-20 (×4): qty 1

## 2022-09-20 MED ORDER — ROCURONIUM BROMIDE 10 MG/ML (PF) SYRINGE
PREFILLED_SYRINGE | INTRAVENOUS | Status: AC
  Filled 2022-09-20: qty 10

## 2022-09-20 MED ORDER — ONDANSETRON HCL 4 MG/2ML IJ SOLN
INTRAMUSCULAR | Status: AC
  Filled 2022-09-20: qty 2

## 2022-09-20 MED ORDER — MIDAZOLAM HCL 2 MG/2ML IJ SOLN
INTRAMUSCULAR | Status: AC
  Filled 2022-09-20: qty 2

## 2022-09-20 MED ORDER — ACETAMINOPHEN 325 MG PO TABS
650.0000 mg | ORAL_TABLET | ORAL | Status: DC | PRN
  Administered 2022-09-21 – 2022-09-22 (×3): 650 mg via ORAL
  Filled 2022-09-20 (×3): qty 2

## 2022-09-20 MED ORDER — PROPOFOL 1000 MG/100ML IV EMUL
INTRAVENOUS | Status: AC
  Filled 2022-09-20: qty 100

## 2022-09-20 MED ORDER — PROPOFOL 10 MG/ML IV BOLUS
INTRAVENOUS | Status: AC
  Filled 2022-09-20: qty 20

## 2022-09-20 MED ORDER — ZOLPIDEM TARTRATE 5 MG PO TABS
5.0000 mg | ORAL_TABLET | Freq: Every evening | ORAL | Status: DC | PRN
  Administered 2022-09-20 – 2022-09-24 (×3): 5 mg via ORAL
  Filled 2022-09-20 (×4): qty 1

## 2022-09-20 MED ORDER — BUPIVACAINE-EPINEPHRINE 0.25% -1:200000 IJ SOLN
INTRAMUSCULAR | Status: DC | PRN
  Administered 2022-09-20: 10 mL

## 2022-09-20 MED ORDER — ACETAMINOPHEN 500 MG PO TABS
1000.0000 mg | ORAL_TABLET | Freq: Once | ORAL | Status: AC
  Administered 2022-09-20: 1000 mg via ORAL
  Filled 2022-09-20: qty 2

## 2022-09-20 MED ORDER — CHLORHEXIDINE GLUCONATE 0.12 % MT SOLN
15.0000 mL | Freq: Once | OROMUCOSAL | Status: AC
  Administered 2022-09-20: 15 mL via OROMUCOSAL
  Filled 2022-09-20: qty 15

## 2022-09-20 MED ORDER — FENTANYL CITRATE (PF) 250 MCG/5ML IJ SOLN
INTRAMUSCULAR | Status: DC | PRN
  Administered 2022-09-20 (×5): 50 ug via INTRAVENOUS

## 2022-09-20 MED ORDER — LIDOCAINE 2% (20 MG/ML) 5 ML SYRINGE
INTRAMUSCULAR | Status: DC | PRN
  Administered 2022-09-20: 60 mg via INTRAVENOUS

## 2022-09-20 MED ORDER — PROPOFOL 10 MG/ML IV BOLUS
INTRAVENOUS | Status: DC | PRN
  Administered 2022-09-20: 80 mg via INTRAVENOUS
  Administered 2022-09-20: 25 ug/kg/min via INTRAVENOUS

## 2022-09-20 MED ORDER — DEXAMETHASONE SODIUM PHOSPHATE 10 MG/ML IJ SOLN
INTRAMUSCULAR | Status: DC | PRN
  Administered 2022-09-20: 10 mg via INTRAVENOUS

## 2022-09-20 MED ORDER — KETAMINE HCL 50 MG/5ML IJ SOSY
PREFILLED_SYRINGE | INTRAMUSCULAR | Status: AC
  Filled 2022-09-20: qty 5

## 2022-09-20 MED ORDER — PANTOPRAZOLE SODIUM 40 MG PO TBEC
40.0000 mg | DELAYED_RELEASE_TABLET | Freq: Every day | ORAL | Status: DC
  Administered 2022-09-20 – 2022-09-26 (×7): 40 mg via ORAL
  Filled 2022-09-20 (×7): qty 1

## 2022-09-20 MED ORDER — DOCUSATE SODIUM 100 MG PO CAPS
100.0000 mg | ORAL_CAPSULE | Freq: Two times a day (BID) | ORAL | Status: DC
  Administered 2022-09-20 – 2022-09-26 (×12): 100 mg via ORAL
  Filled 2022-09-20 (×12): qty 1

## 2022-09-20 MED ORDER — HYDROMORPHONE HCL 1 MG/ML IJ SOLN
INTRAMUSCULAR | Status: DC | PRN
  Administered 2022-09-20: .5 mg via INTRAVENOUS

## 2022-09-20 MED ORDER — BACITRACIN ZINC 500 UNIT/GM EX OINT
TOPICAL_OINTMENT | CUTANEOUS | Status: AC
  Filled 2022-09-20: qty 28.35

## 2022-09-20 MED ORDER — KETAMINE HCL 10 MG/ML IJ SOLN
INTRAMUSCULAR | Status: DC | PRN
  Administered 2022-09-20 (×2): 10 mg via INTRAVENOUS
  Administered 2022-09-20: 30 mg via INTRAVENOUS

## 2022-09-20 MED ORDER — BACITRACIN ZINC 500 UNIT/GM EX OINT
TOPICAL_OINTMENT | CUTANEOUS | Status: DC | PRN
  Administered 2022-09-20: 1 via TOPICAL

## 2022-09-20 MED ORDER — PHENOL 1.4 % MT LIQD: 1.0000 | OROMUCOSAL | Status: DC | PRN

## 2022-09-20 MED ORDER — OXYCODONE HCL 5 MG PO TABS
10.0000 mg | ORAL_TABLET | ORAL | Status: DC | PRN
  Administered 2022-09-20 – 2022-09-26 (×20): 10 mg via ORAL
  Filled 2022-09-20 (×20): qty 2

## 2022-09-20 MED ORDER — HYDROCODONE-ACETAMINOPHEN 10-325 MG PO TABS: 1.0000 | ORAL_TABLET | ORAL | Status: DC | PRN

## 2022-09-20 MED ORDER — CEFAZOLIN SODIUM 1 G IJ SOLR
INTRAMUSCULAR | Status: AC
  Filled 2022-09-20: qty 20

## 2022-09-20 MED ORDER — ONDANSETRON HCL 4 MG/2ML IJ SOLN
INTRAMUSCULAR | Status: DC | PRN
  Administered 2022-09-20: 4 mg via INTRAVENOUS

## 2022-09-20 MED ORDER — SIMVASTATIN 20 MG PO TABS
40.0000 mg | ORAL_TABLET | Freq: Every day | ORAL | Status: DC
  Administered 2022-09-21 – 2022-09-25 (×5): 40 mg via ORAL
  Filled 2022-09-20 (×5): qty 2

## 2022-09-20 MED ORDER — GABAPENTIN 400 MG PO CAPS
800.0000 mg | ORAL_CAPSULE | Freq: Every day | ORAL | Status: DC | PRN
  Administered 2022-09-21 – 2022-09-23 (×3): 800 mg via ORAL
  Filled 2022-09-20 (×3): qty 2

## 2022-09-20 MED ORDER — HYDROMORPHONE HCL 1 MG/ML IJ SOLN
INTRAMUSCULAR | Status: AC
  Filled 2022-09-20: qty 0.5

## 2022-09-20 MED ORDER — MIDAZOLAM HCL 2 MG/2ML IJ SOLN
INTRAMUSCULAR | Status: DC | PRN
  Administered 2022-09-20: 2 mg via INTRAVENOUS

## 2022-09-20 MED ORDER — SODIUM BICARBONATE 8.4 % IV SOLN
INTRAVENOUS | Status: AC
  Filled 2022-09-20: qty 50

## 2022-09-20 MED ORDER — ONDANSETRON HCL 4 MG/2ML IJ SOLN
4.0000 mg | Freq: Four times a day (QID) | INTRAMUSCULAR | Status: DC | PRN
  Filled 2022-09-20: qty 2

## 2022-09-20 MED ORDER — CEFAZOLIN SODIUM-DEXTROSE 2-4 GM/100ML-% IV SOLN
2.0000 g | Freq: Three times a day (TID) | INTRAVENOUS | Status: AC
  Administered 2022-09-20 – 2022-09-21 (×2): 2 g via INTRAVENOUS
  Filled 2022-09-20 (×2): qty 100

## 2022-09-20 MED ORDER — FENTANYL CITRATE (PF) 250 MCG/5ML IJ SOLN
INTRAMUSCULAR | Status: AC
  Filled 2022-09-20: qty 5

## 2022-09-20 MED ORDER — ACETAMINOPHEN 650 MG RE SUPP: 650.0000 mg | RECTAL | Status: DC | PRN

## 2022-09-20 MED ORDER — EPHEDRINE SULFATE-NACL 50-0.9 MG/10ML-% IV SOSY
PREFILLED_SYRINGE | INTRAVENOUS | Status: DC | PRN
  Administered 2022-09-20 (×5): 5 mg via INTRAVENOUS

## 2022-09-20 MED ORDER — CYCLOBENZAPRINE HCL 10 MG PO TABS
5.0000 mg | ORAL_TABLET | Freq: Three times a day (TID) | ORAL | Status: DC | PRN
  Administered 2022-09-21 – 2022-09-26 (×11): 5 mg via ORAL
  Filled 2022-09-20 (×11): qty 1

## 2022-09-20 MED ORDER — CYCLOBENZAPRINE HCL 10 MG PO TABS: 10.0000 mg | ORAL_TABLET | Freq: Three times a day (TID) | ORAL | Status: DC | PRN

## 2022-09-20 MED ORDER — CEFAZOLIN SODIUM-DEXTROSE 2-4 GM/100ML-% IV SOLN
2.0000 g | INTRAVENOUS | Status: AC
  Administered 2022-09-20 (×2): 2 g via INTRAVENOUS
  Filled 2022-09-20: qty 100

## 2022-09-20 MED ORDER — AMLODIPINE BESYLATE 10 MG PO TABS
10.0000 mg | ORAL_TABLET | Freq: Every day | ORAL | Status: DC
  Administered 2022-09-21 – 2022-09-26 (×6): 10 mg via ORAL
  Filled 2022-09-20 (×6): qty 1

## 2022-09-20 MED ORDER — ORAL CARE MOUTH RINSE: 15.0000 mL | Freq: Once | OROMUCOSAL | Status: AC

## 2022-09-20 MED ORDER — DEXAMETHASONE SODIUM PHOSPHATE 10 MG/ML IJ SOLN
INTRAMUSCULAR | Status: AC
  Filled 2022-09-20: qty 1

## 2022-09-20 MED ORDER — HYDROMORPHONE HCL 1 MG/ML IJ SOLN
INTRAMUSCULAR | Status: AC
  Filled 2022-09-20: qty 1

## 2022-09-20 MED ORDER — PHENYLEPHRINE HCL-NACL 20-0.9 MG/250ML-% IV SOLN
INTRAVENOUS | Status: DC | PRN
  Administered 2022-09-20: 40 ug/min via INTRAVENOUS

## 2022-09-20 MED ORDER — HEMOSTATIC AGENTS (NO CHARGE) OPTIME
TOPICAL | Status: DC | PRN
  Administered 2022-09-20: 1 via TOPICAL

## 2022-09-20 MED ORDER — CHLORHEXIDINE GLUCONATE CLOTH 2 % EX PADS: 6.0000 | MEDICATED_PAD | Freq: Once | CUTANEOUS | Status: DC

## 2022-09-20 MED ORDER — BISACODYL 10 MG RE SUPP
10.0000 mg | Freq: Every day | RECTAL | Status: DC | PRN
  Administered 2022-09-24: 10 mg via RECTAL
  Filled 2022-09-20: qty 1

## 2022-09-20 MED ORDER — LIDOCAINE 2% (20 MG/ML) 5 ML SYRINGE
INTRAMUSCULAR | Status: AC
  Filled 2022-09-20: qty 5

## 2022-09-20 MED ORDER — SODIUM CHLORIDE 0.9 % IV SOLN: INTRAVENOUS | Status: DC | PRN

## 2022-09-20 MED ORDER — FUROSEMIDE 40 MG PO TABS
40.0000 mg | ORAL_TABLET | Freq: Every day | ORAL | Status: DC
  Administered 2022-09-21 – 2022-09-26 (×6): 40 mg via ORAL
  Filled 2022-09-20 (×6): qty 1

## 2022-09-20 MED ORDER — ROCURONIUM BROMIDE 10 MG/ML (PF) SYRINGE
PREFILLED_SYRINGE | INTRAVENOUS | Status: DC | PRN
  Administered 2022-09-20: 10 mg via INTRAVENOUS
  Administered 2022-09-20: 20 mg via INTRAVENOUS
  Administered 2022-09-20: 100 mg via INTRAVENOUS
  Administered 2022-09-20 (×2): 20 mg via INTRAVENOUS

## 2022-09-20 MED ORDER — OXYCODONE HCL 5 MG PO TABS
5.0000 mg | ORAL_TABLET | ORAL | Status: DC | PRN
  Administered 2022-09-24: 5 mg via ORAL
  Filled 2022-09-20: qty 1

## 2022-09-20 MED ORDER — METOPROLOL SUCCINATE ER 50 MG PO TB24
50.0000 mg | ORAL_TABLET | Freq: Every day | ORAL | Status: DC
  Administered 2022-09-21 – 2022-09-26 (×6): 50 mg via ORAL
  Filled 2022-09-20 (×6): qty 1

## 2022-09-20 MED ORDER — PHENYLEPHRINE 80 MCG/ML (10ML) SYRINGE FOR IV PUSH (FOR BLOOD PRESSURE SUPPORT)
PREFILLED_SYRINGE | INTRAVENOUS | Status: DC | PRN
  Administered 2022-09-20: 160 ug via INTRAVENOUS
  Administered 2022-09-20 (×2): 80 ug via INTRAVENOUS
  Administered 2022-09-20 (×2): 160 ug via INTRAVENOUS

## 2022-09-20 MED ORDER — ONDANSETRON HCL 4 MG PO TABS: 4.0000 mg | ORAL_TABLET | Freq: Four times a day (QID) | ORAL | Status: DC | PRN

## 2022-09-20 SURGICAL SUPPLY — 75 items
APL SKNCLS STERI-STRIP NONHPOA (GAUZE/BANDAGES/DRESSINGS) ×1
BAG COUNTER SPONGE SURGICOUNT (BAG) ×1 IMPLANT
BAG SPNG CNTER NS LX DISP (BAG) ×2
BASKET BONE COLLECTION (BASKET) ×1 IMPLANT
BENZOIN TINCTURE PRP APPL 2/3 (GAUZE/BANDAGES/DRESSINGS) ×1 IMPLANT
BLADE CLIPPER SURG (BLADE) IMPLANT
BUR MATCHSTICK NEURO 3.0 LAGG (BURR) ×1 IMPLANT
BUR PRECISION FLUTE 6.0 (BURR) ×1 IMPLANT
CAGE ALTERA 10X31X9-13 15D (Cage) IMPLANT
CAGE SABLE 10X22 6-12 0D (Cage) IMPLANT
CANISTER SUCT 3000ML PPV (MISCELLANEOUS) ×1 IMPLANT
CAP LOCK DLX THRD (Cap) IMPLANT
CNTNR URN SCR LID CUP LEK RST (MISCELLANEOUS) ×1 IMPLANT
CONT SPEC 4OZ STRL OR WHT (MISCELLANEOUS) ×1
COVER BACK TABLE 60X90IN (DRAPES) ×1 IMPLANT
DRAPE C-ARM 42X72 X-RAY (DRAPES) ×2 IMPLANT
DRAPE HALF SHEET 40X57 (DRAPES) ×1 IMPLANT
DRAPE LAPAROTOMY 100X72X124 (DRAPES) ×1 IMPLANT
DRAPE SURG 17X23 STRL (DRAPES) ×4 IMPLANT
DRSG OPSITE POSTOP 4X6 (GAUZE/BANDAGES/DRESSINGS) ×1 IMPLANT
DRSG OPSITE POSTOP 4X8 (GAUZE/BANDAGES/DRESSINGS) IMPLANT
ELECT BLADE 4.0 EZ CLEAN MEGAD (MISCELLANEOUS) ×1
ELECT REM PT RETURN 9FT ADLT (ELECTROSURGICAL) ×1
ELECTRODE BLDE 4.0 EZ CLN MEGD (MISCELLANEOUS) ×1 IMPLANT
ELECTRODE REM PT RTRN 9FT ADLT (ELECTROSURGICAL) ×1 IMPLANT
GAUZE 4X4 16PLY ~~LOC~~+RFID DBL (SPONGE) ×1 IMPLANT
GLOVE BIO SURGEON STRL SZ 6 (GLOVE) ×1 IMPLANT
GLOVE BIO SURGEON STRL SZ8 (GLOVE) ×2 IMPLANT
GLOVE BIO SURGEON STRL SZ8.5 (GLOVE) ×2 IMPLANT
GLOVE BIOGEL PI IND STRL 6.5 (GLOVE) ×1 IMPLANT
GLOVE BIOGEL PI IND STRL 7.0 (GLOVE) IMPLANT
GLOVE BIOGEL PI IND STRL 7.5 (GLOVE) IMPLANT
GLOVE EXAM NITRILE XL STR (GLOVE) IMPLANT
GLOVE SURG SS PI 7.0 STRL IVOR (GLOVE) IMPLANT
GOWN STRL REUS W/ TWL LRG LVL3 (GOWN DISPOSABLE) ×1 IMPLANT
GOWN STRL REUS W/ TWL XL LVL3 (GOWN DISPOSABLE) ×2 IMPLANT
GOWN STRL REUS W/TWL 2XL LVL3 (GOWN DISPOSABLE) IMPLANT
GOWN STRL REUS W/TWL LRG LVL3 (GOWN DISPOSABLE) ×2
GOWN STRL REUS W/TWL XL LVL3 (GOWN DISPOSABLE) ×4
GRAFT DURAGEN MATRIX 1WX1L (Tissue) IMPLANT
GRAFT TRINITY ELITE LGE HUMAN (Tissue) IMPLANT
HEMOSTAT POWDER KIT SURGIFOAM (HEMOSTASIS) ×1 IMPLANT
KIT BASIN OR (CUSTOM PROCEDURE TRAY) ×1 IMPLANT
KIT GRAFTMAG DEL NEURO DISP (NEUROSURGERY SUPPLIES) IMPLANT
KIT TURNOVER KIT B (KITS) ×1 IMPLANT
NDL HYPO 21X1.5 SAFETY (NEEDLE) IMPLANT
NEEDLE HYPO 21X1.5 SAFETY (NEEDLE) ×1 IMPLANT
NEEDLE HYPO 22GX1.5 SAFETY (NEEDLE) ×1 IMPLANT
NS IRRIG 1000ML POUR BTL (IV SOLUTION) ×1 IMPLANT
PACK LAMINECTOMY NEURO (CUSTOM PROCEDURE TRAY) ×1 IMPLANT
PAD ARMBOARD 7.5X6 YLW CONV (MISCELLANEOUS) ×3 IMPLANT
PATTIES SURGICAL .5 X.5 (GAUZE/BANDAGES/DRESSINGS) IMPLANT
PATTIES SURGICAL .5 X1 (DISPOSABLE) IMPLANT
PATTIES SURGICAL 1X1 (DISPOSABLE) IMPLANT
PUTTY DBM 5CC CALC GRAN (Putty) IMPLANT
ROD CURVED TI 6.35X90 (Rod) IMPLANT
SCREW PA DLX CREO 7.5X45 (Screw) IMPLANT
SCREW PA DLX CREO 7.5X50 (Screw) IMPLANT
SCREW PA DLX CREO 7.5X55 (Screw) IMPLANT
SPACER ALTERA 10X31 8-12MM-8 (Spacer) IMPLANT
SPIKE FLUID TRANSFER (MISCELLANEOUS) ×1 IMPLANT
SPONGE NEURO XRAY DETECT 1X3 (DISPOSABLE) IMPLANT
SPONGE SURGIFOAM ABS GEL 100 (HEMOSTASIS) IMPLANT
SPONGE SURGIFOAM ABS GEL SZ50 (HEMOSTASIS) IMPLANT
SPONGE T-LAP 4X18 ~~LOC~~+RFID (SPONGE) IMPLANT
STRIP CLOSURE SKIN 1/2X4 (GAUZE/BANDAGES/DRESSINGS) ×1 IMPLANT
SUT PROLENE 6 0 BV (SUTURE) IMPLANT
SUT VIC AB 1 CT1 18XBRD ANBCTR (SUTURE) ×2 IMPLANT
SUT VIC AB 1 CT1 8-18 (SUTURE) ×2
SUT VIC AB 2-0 CP2 18 (SUTURE) ×2 IMPLANT
SYR 20ML LL LF (SYRINGE) IMPLANT
TOWEL GREEN STERILE (TOWEL DISPOSABLE) ×1 IMPLANT
TOWEL GREEN STERILE FF (TOWEL DISPOSABLE) ×1 IMPLANT
TRAY FOLEY MTR SLVR 16FR STAT (SET/KITS/TRAYS/PACK) ×1 IMPLANT
WATER STERILE IRR 1000ML POUR (IV SOLUTION) ×1 IMPLANT

## 2022-09-20 NOTE — H&P (Signed)
Subjective: The patient is a 67 year old white female who has complained of back and left greater than right leg pain consistent with neurogenic claudication/lumbar radiculopathy.  She has failed medical management and was worked up with a lumbar MRI which demonstrated adult degenerative scoliosis, spondylolisthesis, etc.  I discussed the various treatment options with her.  She has decided proceed with surgery.  Past Medical History:  Diagnosis Date   Arthritis    Cancer ([place])    FACIAL CANCER    Dependent edema    Diabetes mellitus    pt reports she was pre-DM in the past but lost weight and is no longer DM   Headache    Hypertension    Hypertriglyceridemia    Hypokalemia    Metrorrhagia    Obesity    Peptic ulcer disease    PONV (postoperative nausea and vomiting)    SUI (stress urinary incontinence, female)    TIA (transient ischemic attack)    "I might've had a TIA in summer 2022"   Tobacco abuse     Past Surgical History:  Procedure Laterality Date   ANTERIOR CRUCIATE LIGAMENT REPAIR     BILATERAL  92+95   arthroscopic knee surgery  05/2003   right knee   CYST EXCISION     OVARY      NECK SURGERY  07/05/2021   perforated duodenal ulcer  10/2006   RADIOACTIVE SEED IMPLANT     fluid drained from right breast - pt denies having radioactive seed placed   REVERSE SHOULDER ARTHROPLASTY Right 07/05/2016   Procedure: REVERSE SHOULDER ARTHROPLASTY;  Surgeon: Justice Britain, MD;  Location: [facility];  Service: Orthopedics;  Laterality: Right;   TUBAL LIGATION     67 yo    No Known Allergies  Social History   Tobacco Use   Smoking status: Every Day    Packs/day: 1.00    Types: Cigarettes    Last attempt to quit: 07/30/2008    Years since quitting: 14.1   Smokeless tobacco: Never  Substance Use Topics   Alcohol use: No    Alcohol/week: 0.0 standard drinks of alcohol    Family History  Problem Relation Age of Onset   Hypertension Mother    Dementia Mother    Lung cancer  Father    Breast cancer Neg Hx    Prior to Admission medications   Medication Sig Start Date End Date Taking? Authorizing Provider  allopurinol (ZYLOPRIM) 300 MG tablet TAKE 1 TABLET BY MOUTH EVERY DAY 09/05/22  Yes Elby Showers, MD  amLODipine (NORVASC) 10 MG tablet TAKE 1 TABLET BY MOUTH EVERY DAY 02/12/22  Yes Baxley, Cresenciano Lick, MD  aspirin EC 81 MG tablet Take 81 mg by mouth daily.   Yes [provider]  Cholecalciferol 100 MCG (4000 UT) CAPS Take 4,000 Units by mouth daily.   Yes [provider]  cyclobenzaprine (FLEXERIL) 5 MG tablet Take 5 mg by mouth 3 (three) times daily as needed for muscle spasms. 02/08/22  Yes [provider]  diazepam (VALIUM) 5 MG tablet TAKE 1 TABLET (5 MG TOTAL) BY MOUTH EVERY 12 (TWELVE) HOURS AS NEEDED. FOR ANXIETY Patient taking differently: Take 5 mg by mouth daily. 06/14/22  Yes Baxley, Cresenciano Lick, MD  diclofenac Sodium (VOLTAREN) 1 % GEL Apply 1 Application topically 4 (four) times daily as needed (pain).   Yes [provider]  furosemide (LASIX) 40 MG tablet TAKE 1 TABLET BY MOUTH EVERY DAY 02/08/22  Yes Baxley, Cresenciano Lick,  MD  gabapentin (NEURONTIN) 400 MG capsule Take 800 mg by mouth See admin instructions. Take 800 mg daily, may take a second 800 mg dose as needed for pain 08/21/20  Yes [provider]  HYDROcodone-acetaminophen (NORCO) 10-325 MG tablet Take 1 tablet by mouth every 4 (four) hours as needed for pain. Stop MSIR. 09/05/22  Yes   KLOR-CON M20 20 MEQ tablet TAKE 1 TABLET BY MOUTH 3 TIMES DAILY. 04/07/22  Yes Baxley, Cresenciano Lick, MD  metoprolol succinate (TOPROL-XL) 50 MG 24 hr tablet TAKE 1 TABLET BY MOUTH EVERY DAY WITH OR IMMEDIATELY FOLLOWING A MEAL 07/11/22  Yes Baxley, Cresenciano Lick, MD  olmesartan (BENICAR) 40 MG tablet TAKE 1 TABLET BY MOUTH EVERY DAY 06/14/22  Yes Baxley, Cresenciano Lick, MD  Omega-3 Fatty Acids (FISH OIL) 1000 MG CAPS Take 2,000 mg by mouth daily.   Yes [provider]  pantoprazole (PROTONIX) 40 MG  tablet TAKE 1 TABLET BY MOUTH EVERY DAY 07/10/22  Yes Baxley, Cresenciano Lick, MD  Polyethyl Glycol-Propyl Glycol (SYSTANE OP) Place 1 drop into both eyes daily as needed (dry eyes).   Yes [provider]  simvastatin (ZOCOR) 40 MG tablet TAKE 1 TABLET BY MOUTH EVERYDAY AT BEDTIME 12/31/21  Yes Baxley, Cresenciano Lick, MD  vitamin A 10000 UNIT capsule Take 10,000 Units by mouth daily.   Yes [provider]  CONTOUR NEXT TEST test strip USE AS INSTRUCTED, TO TEST TWICE A DAY 09/14/21   Elby Showers, MD  HYDROmorphone (DILAUDID) 2 MG tablet Take 1 tablet (2 mg total) by mouth every 4 (four) hours as needed for pain. Take with Hydrocodone/Apap for post op pain control. 09/05/22     morphine (MSIR) 15 MG tablet Take 1 tablet (15 mg total) by mouth every 4 (four) hours as needed for pain (stop Hydrocodone/APAP 10/325) 08/08/22     ONE TOUCH ULTRA TEST test strip TEST 2 TIMES A DAY 06/28/16   Elby Showers, MD     Review of Systems  Positive ROS: As above  All other systems have been reviewed and were otherwise negative with the exception of those mentioned in the HPI and as above.  Objective: Vital signs in last 24 hours: Temp:  [98 F (36.7 C)] 98 F (36.7 C) (02/22 0612) Pulse Rate:  [88] 88 (02/22 0612) Resp:  [18] 18 (02/22 0612) BP: (135)/(81) 135/81 (02/22 0612) SpO2:  [95 %] 95 % (02/22 0612) Weight:  [96.1 kg] 96.1 kg (02/22 0614) Estimated body mass index is 37.53 kg/m as calculated from the following:   Height as of this encounter: 5' 3"$  (1.6 m).   Weight as of this encounter: 96.1 kg.   General Appearance: Alert Head: Normocephalic, without obvious abnormality, atraumatic Eyes: PERRL, conjunctiva/corneas clear, EOM's intact,    Ears: Normal  Throat: Normal  Neck: Supple, Back: unremarkable Lungs: Clear to auscultation bilaterally, respirations unlabored Heart: Regular rate and rhythm, no murmur, rub or gallop Abdomen: Soft, non-tender Extremities: Extremities normal,  atraumatic, no cyanosis or edema Skin: unremarkable  NEUROLOGIC:   Mental status: alert and oriented,Motor Exam - grossly normal Sensory Exam - grossly normal Reflexes:  Coordination - grossly normal Gait - grossly normal Balance - grossly normal Cranial Nerves: I: smell Not tested  II: visual acuity  OS: Normal  OD: Normal   II: visual fields Full to confrontation  II: pupils Equal, round, reactive to light  III,VII: ptosis None  III,IV,VI: extraocular muscles  Full ROM  V: mastication Normal  V: facial light touch sensation  Normal  V,VII: corneal reflex  Present  VII: facial muscle function - upper  Normal  VII: facial muscle function - lower Normal  VIII: hearing Not tested  IX: soft palate elevation  Normal  IX,X: gag reflex Present  XI: trapezius strength  5/5  XI: sternocleidomastoid strength 5/5  XI: neck flexion strength  5/5  XII: tongue strength  Normal    Data Review Lab Results  Component Value Date   WBC 5.5 09/11/2022   HGB 15.2 09/11/2022   HCT 44.4 09/11/2022   MCV 97.6 09/11/2022   PLT 260 09/11/2022   Lab Results  Component Value Date   NA 144 09/11/2022   K 4.3 09/11/2022   CL 105 09/11/2022   CO2 28 09/11/2022   BUN 12 09/11/2022   CREATININE 0.59 09/11/2022   GLUCOSE 117 (H) 09/11/2022   No results found for: "INR", "PROTIME"  Assessment/Plan: Adult degenerative scoliosis, lumbar degenerative disc disease, lumbar spinal stenosis, lumbago, lumbar radiculopathy, neurogenic claudication: I have discussed the situation with the patient.  I reviewed her imaging studies with her and pointed out the abnormalities.  We have discussed the various treatment options including surgery.  I have described the surgical treatment option of a L2-3, L3-4 and L4-5 decompression, instrumentation and fusion.  I have shown her surgical models.  We have discussed the risk, benefits, alternatives, expected postoperative course, and likelihood of achieving our goals  with surgery.  I have answered all her questions.  She has decided proceed with surgery.   [name] 09/20/2022 7:19 AM

## 2022-09-20 NOTE — Op Note (Signed)
Brief history: The patient is a 67 year old white female who is complaining of back and leg pain consistent with neurogenic claudication/lumbar radiculopathy.  She failed medical management and was worked up with a lumbar MRI and lumbar x-rays which demonstrated degenerative scoliosis, degenerative disease, stenosis, etc.  I discussed the various treatment options with her.  She has decided proceed with surgery.  Preoperative diagnosis: Lumbar degenerative disc disease, spinal stenosis compressing the L3, L4 and L5 nerve roots; lumbago; lumbar radiculopathy; neurogenic claudication  Postoperative diagnosis: The same  Procedure: Bilateral L2-3, L3-4 and L4-5 laminotomy/foraminotomies/medial facetectomy to decompress the bilateral L3, L4 and L5 nerve roots(the work required to do this was in addition to the work required to do the posterior lumbar interbody fusion because of the patient's spinal stenosis, facet arthropathy. Etc. requiring a wide decompression of the nerve roots.);  Left L2-3, L3-4 and L4-5 transforaminal lumbar interbody fusion with local morselized autograft bone and Zimmer DBM; insertion of interbody prosthesis at L2-3, L3-4 and L4-5 (globus peek expandable interbody prosthesis); posterior segmental instrumentation from L2 to L5 with globus titanium pedicle screws and rods; posterior lateral arthrodesis at L2-3, L3-4 and L4-5 with local morselized autograft bone and Zimmer DBM.  Surgeon: Dr. Earle Gell  Asst.: Dr. Duffy Rhody and Arnetha Massy, NP  Anesthesia: Gen. endotracheal  Estimated blood loss: 400 cc  Drains: None  Complications: Durotomy  Description of procedure: The patient was brought to the operating room by the anesthesia team. General endotracheal anesthesia was induced. The patient was turned to the prone position on the Wilson frame. The patient's lumbosacral region was then prepared with Betadine scrub and Betadine solution. Sterile drapes were  applied.  I then injected the area to be incised with Marcaine with epinephrine solution. I then used the scalpel to make a linear midline incision over the 2 3, 3 4 and L4-5 interspace. I then used electrocautery to perform a bilateral subperiosteal dissection exposing the spinous process and lamina of L2-3, L3-4 and L4-5. We then obtained intraoperative radiograph to confirm our location. We then inserted the Verstrac retractor to provide exposure.  I began the decompression by using the high speed drill to perform laminotomies at L2-3, L3-4 and L4-5 bilaterally. We then used the Kerrison punches to widen the laminotomy and removed the ligamentum flavum at L2-3, L3-4 and L4-5 bilaterally. We used the Kerrison punches to remove the medial facets at L2-3, L3-4 and L4-5 bilaterally, we removed the left L2-3, L3-4 and L4-5 facet. We performed wide foraminotomies about the bilateral L3, L4 and L5 nerve roots completing the decompression.  In the process performing the decompression I did create a durotomy at L2-3 on the right which I repaired with two 6-0 Prolene sutures.  I also created a durotomy at L5-S1 on the right which I repaired with Gelfoam and DuraGen.  We now turned our attention to the posterior lumbar interbody fusion. I used a scalpel to incise the intervertebral disc at L2-3, L3-4 and L4-5 bilaterally. I then performed a partial intervertebral discectomy at L2-3, L3-4 and L4-5 bilaterally using the pituitary forceps. We prepared the vertebral endplates at 2 3, [id number] and L4-5 bilaterally for the fusion by removing the soft tissues with the curettes. We then used the trial spacers to pick the appropriate sized interbody prosthesis. We prefilled his prosthesis with a combination of local morselized autograft bone that we obtained during the decompression as well as Zimmer DBM. We inserted the prefilled prosthesis into the interspace at L2-3, L3-4 and  L4-5 from the left, we then turned at L4-5 and L2-3  and expanded all 3 of the  prosthesis. There was a good snug fit of the prosthesis in the interspace.  While placing the prosthesis at L2-3 on the left one of the patty strings got stuck in the disc base.  I am expanded the prosthesis and removed the patty string in its entirety.  We then filled and the remainder of the intervertebral disc space with local morselized autograft bone and Zimmer DBM. This completed the posterior lumbar interbody arthrodesis.  During the decompression and insertion of the prosthesis the assistant protected the thecal sac and nerve roots with the D'Errico retractor.  We now turned attention to the instrumentation. Under fluoroscopic guidance we cannulated the bilateral L2, L3, L4 and L5 pedicles with the bone probe. We then removed the bone probe. We then tapped the pedicle with a 6.5 millimeter tap. We then removed the tap. We probed inside the tapped pedicle with a ball probe to rule out cortical breaches. We then inserted a 7.5 x 45, 50 and 55 millimeter pedicle screw into the L2, L3, L4 and L5 pedicles bilaterally under fluoroscopic guidance. We then palpated along the medial aspect of the pedicles to rule out cortical breaches. There were none. The nerve roots were not injured. We then connected the unilateral pedicle screws with a lordotic rod. We compressed the construct and secured the rod in place with the caps. We then tightened the caps appropriately. This completed the instrumentation from L2 and L5 bilaterally.  We now turned our attention to the posterior lateral arthrodesis at L2-3, L3-4 and L4-5. We used the high-speed drill to decorticate the remainder of the facets, pars, transverse process at 2 3, L3-4 and L4-5. We then applied a combination of local morselized autograft bone and Zimmer DBM over these decorticated posterior lateral structures. This completed the posterior lateral arthrodesis.  We then obtained hemostasis using bipolar electrocautery. We irrigated  the wound out with bacitracin solution. We inspected the thecal sac and nerve roots and noted they were well decompressed. We then removed the retractor.  We injected Exparel . We reapproximated patient's thoracolumbar fascia with interrupted #1 Vicryl suture. We reapproximated patient's subcutaneous tissue with interrupted 2-0 Vicryl suture. The reapproximated patient's skin with Steri-Strips and benzoin. The wound was then coated with bacitracin ointment. A sterile dressing was applied. The drapes were removed. The patient was subsequently returned to the supine position where they were extubated by the anesthesia team. He was then transported to the post anesthesia care unit in stable condition. All sponge instrument and needle counts were reportedly correct at the end of this case.

## 2022-09-20 NOTE — Anesthesia Postprocedure Evaluation (Signed)
Anesthesia Post Note  Patient: Hephzibah Stariha Gravley  Procedure(s) Performed: Posterior Lumbar Interbody Fusion,Interbody Prothesis,POSTERIOR INSTRUMENTATION Lumbar two-three,Lumbar three-four,Lumbar four-five     Patient location during evaluation: PACU Anesthesia Type: General Level of consciousness: awake and alert Pain management: pain level controlled Vital Signs Assessment: post-procedure vital signs reviewed and stable Respiratory status: spontaneous breathing, nonlabored ventilation, respiratory function stable and patient connected to nasal cannula oxygen Cardiovascular status: blood pressure returned to baseline and stable Postop Assessment: no apparent nausea or vomiting Anesthetic complications: no  There were no known notable events for this encounter.  Last Vitals:  Vitals:   09/20/22 1615 09/20/22 1630  BP: 106/68 111/72  Pulse: 79 81  Resp: 12 14  Temp:    SpO2: 99% 99%    Last Pain:  Vitals:   09/20/22 1630  TempSrc:   PainSc: 5                  Keyasha Miah,W. EDMOND

## 2022-09-20 NOTE — Anesthesia Procedure Notes (Signed)
Procedure Name: Intubation Date/Time: 09/20/2022 7:49 AM  Performed by: Ester Rink, CRNAPre-anesthesia Checklist: Patient identified, Emergency Drugs available, Suction available and Patient being monitored Patient Re-evaluated:Patient Re-evaluated prior to induction Oxygen Delivery Method: Circle system utilized Preoxygenation: Pre-oxygenation with 100% oxygen Induction Type: IV induction Ventilation: Mask ventilation without difficulty Laryngoscope Size: Mac and 4 Grade View: Grade I Tube type: Oral Tube size: 7.0 mm Number of attempts: 1 Airway Equipment and Method: Stylet and Oral airway Placement Confirmation: ETT inserted through vocal cords under direct vision, positive ETCO2 and breath sounds checked- equal and bilateral Secured at: 22 cm Tube secured with: Tape Dental Injury: Teeth and Oropharynx as per pre-operative assessment

## 2022-09-20 NOTE — Progress Notes (Signed)
Orthopedic Tech Progress Note Patient Details:  Gabriella [name] [birth year] [id number]  Patient ID: Humberto Leep Marquart, female   DOB: [birth year], 67 y.o.   MRN: [id number] Patient stated she already had an LSO that was provided by her MD. Vernona Rieger 09/20/2022, 6:32 PM

## 2022-09-20 NOTE — Transfer of Care (Signed)
Immediate Anesthesia Transfer of Care Note  Patient: Gabriella [name]  Procedure(s) Performed: Posterior Lumbar Interbody Fusion,Interbody Prothesis,POSTERIOR INSTRUMENTATION Lumbar two-three,Lumbar three-four,Lumbar four-five  Patient Location: PACU  Anesthesia Type:General  Level of Consciousness: drowsy and patient cooperative  Airway & Oxygen Therapy: Patient connected to face mask oxygen  Post-op Assessment: Report given to RN and Post -op Vital signs reviewed and stable  Post vital signs: Reviewed and stable  Last Vitals:  Vitals Value Taken Time  BP    Temp    Pulse 90 09/20/22 1520  Resp 12 09/20/22 1520  SpO2 100 % 09/20/22 1520  Vitals shown include unvalidated device data.  Last Pain:  Vitals:   09/20/22 0612  TempSrc: Oral  PainSc:       Patients Stated Pain Goal: 1 ([id number])  Complications: There were no known notable events for this encounter.

## 2022-09-21 LAB — CBC
HCT: 33.2 % — ABNORMAL LOW (ref 36.0–46.0)
Hemoglobin: 11.3 g/dL — ABNORMAL LOW (ref 12.0–15.0)
MCH: 33.7 pg (ref 26.0–34.0)
MCHC: 34 g/dL (ref 30.0–36.0)
MCV: 99.1 fL (ref 80.0–100.0)
Platelets: 205 10*3/uL (ref 150–400)
RBC: 3.35 MIL/uL — ABNORMAL LOW (ref 3.87–5.11)
RDW: 13.8 % (ref 11.5–15.5)
WBC: 9.6 10*3/uL (ref 4.0–10.5)
nRBC: 0 % (ref 0.0–0.2)

## 2022-09-21 LAB — BASIC METABOLIC PANEL
Anion gap: 8 (ref 5–15)
BUN: 15 mg/dL (ref 8–23)
CO2: 27 mmol/L (ref 22–32)
Calcium: 7.9 mg/dL — ABNORMAL LOW (ref 8.9–10.3)
Chloride: 104 mmol/L (ref 98–111)
Creatinine, Ser: 0.6 mg/dL (ref 0.44–1.00)
GFR, Estimated: >60 mL/min (ref >60)
Glucose, Bld: 143 mg/dL — ABNORMAL HIGH (ref 70–99)
Potassium: 4.1 mmol/L (ref 3.5–5.1)
Sodium: 139 mmol/L (ref 135–145)

## 2022-09-21 LAB — GLUCOSE, CAPILLARY: Glucose-Capillary: 155 mg/dL — ABNORMAL HIGH (ref 70–99)

## 2022-09-21 MED ORDER — CHLORHEXIDINE GLUCONATE CLOTH 2 % EX PADS
6.0000 | MEDICATED_PAD | Freq: Every day | CUTANEOUS | Status: DC
  Administered 2022-09-21 – 2022-09-26 (×6): 6 via TOPICAL

## 2022-09-21 MED FILL — Thrombin For Soln 5000 Unit: CUTANEOUS | Qty: 5000 | Status: AC

## 2022-09-21 NOTE — Progress Notes (Signed)
Subjective: The patient is alert and pleasant.  She is sitting up in bed.  She denies headaches.  Her back is appropriately sore.  Objective: Vital signs in last 24 hours: Temp:  [97.8 F (36.6 C)-100.1 F (37.8 C)] 98.8 F (37.1 C) (02/23 0747) Pulse Rate:  [77-107] 97 (02/23 0747) Resp:  [11-16] 15 (02/23 0747) BP: (92-114)/(49-76) 110/62 (02/23 0747) SpO2:  [90 %-100 %] 91 % (02/23 0747) Arterial Line BP: (108-144)/(56-65) 144/58 (02/22 1700) Estimated body mass index is 37.53 kg/m as calculated from the following:   Height as of this encounter: '5\' 3"'$  (1.6 m).   Weight as of this encounter: 96.1 kg.   Intake/Output from previous day: 02/22 0701 - 02/23 0700 In: 4600 [I.V.:3600; Blood:200; IV Piggyback:800] Out: 1950 [Urine:1500; Blood:450] Intake/Output this shift: Total I/O In: 0.4 [I.V.:0.4] Out: -   Physical exam the patient is alert and oriented.  Her dressing is clean and dry.  Her strength is normal.  Lab Results: Recent Labs    09/20/22 1413 09/21/22 0727  WBC  --  9.6  HGB 12.6 11.3*  HCT 37.0 33.2*  PLT  --  205   BMET Recent Labs    09/20/22 1413 09/21/22 0727  NA 140 139  K 4.0 4.1  CL  --  104  CO2  --  27  GLUCOSE  --  143*  BUN  --  15  CREATININE  --  0.60  CALCIUM  --  7.9*    Studies/Results: DG Lumbar Spine 1 View  Result Date: 09/20/2022 CLINICAL DATA:  Rule out retained foreign body. EXAM: LUMBAR SPINE - 1 VIEW COMPARISON:  Earlier radiographs, same date. FINDINGS: No unexpected radiopaque foreign body is identified. Fusion hardware appears to be in good position without complicating features. IMPRESSION: No unexpected radiopaque foreign body. Electronically Signed   By: Marijo Sanes M.D.   On: 09/20/2022 14:53   DG Lumbar Spine 2-3 Views  Result Date: 09/20/2022 CLINICAL DATA:  Elective surgery. EXAM: LUMBAR SPINE - 2-3 VIEW COMPARISON:  Preoperative imaging demonstrating 5 non-rib-bearing lumbar vertebra. FINDINGS: Two  fluoroscopic spot views of the lumbar spine obtained in frontal and lateral projections. Intrapedicular screws with interbody spacers from L2 through L5. Fluoroscopy time 27 seconds. Dose 41.55 mGy. IMPRESSION: Intraoperative fluoroscopy for L2-L5 fusion. Electronically Signed   By: Keith Rake M.D.   On: 09/20/2022 14:45   DG Lumbar Spine 1 View  Result Date: 09/20/2022 CLINICAL DATA:  Elective surgery. Intraoperative localization. EXAM: LUMBAR SPINE - 1 VIEW COMPARISON:  Preoperative radiograph 10/31/2021 demonstrating 5 non-rib-bearing lumbar vertebra. FINDINGS: Single cross-table lateral view of the lumbar spine obtained in the operating room. Low resolution image. Surgical instruments localize posteriorly projecting over the L3 spinous process. IMPRESSION: Surgical instruments posteriorly projecting over the L3 spinous process. Electronically Signed   By: Keith Rake M.D.   On: 09/20/2022 14:44   DG C-Arm 1-60 Min-No Report  Result Date: 09/20/2022 Fluoroscopy was utilized by the requesting physician.  No radiographic interpretation.   DG C-Arm 1-60 Min-No Report  Result Date: 09/20/2022 Fluoroscopy was utilized by the requesting physician.  No radiographic interpretation.    Assessment/Plan: Postop day 1: We will plan to keep her in bed until tomorrow secondary to her CSF leak.  I have instructed her to keep her head of bed less than 20 degrees.  I have answered all her questions.  I have again attempted to contact her husband.  I got his full voicemail.  LOS: 1  day     [name] 09/21/2022, 9:58 AM     Patient ID: Gabriella [name], female   DOB: 12-11-1955, 67 y.o.   MRN: [id number]

## 2022-09-21 NOTE — Progress Notes (Signed)
PT Cancellation Note  Patient Details Name: Gabriella [name] MRN: [id number] DOB: Feb 03, 1956   Cancelled Treatment:    Reason Eval/Treat Not Completed: Active bedrest order Will await increase in activity orders prior to PT evaluation. Will follow.   Gabriella [name] 09/21/2022, 7:17 AM Gabriella [name], PT, DPT Acute Rehabilitation Services Secure chat preferred Office [phone]

## 2022-09-21 NOTE — Progress Notes (Signed)
  Transition of Care [facility]) Screening Note   Patient Details  Name: Briselda Lippincott Blankenbaker Date of Birth: 1956-07-21   Transition of Care [facility]) CM/SW Contact:    Dawayne Patricia, RN Phone Number: 09/21/2022, 12:57 PM    Transition of Care Department [facility]) has reviewed patient and note pt s/p Bilateral L2-3, L3-4 and L4-5 laminotomy/foraminotomies/medial facetectomy. Pt remains on bedrest today with PT/OT evals pending. We will continue to monitor patient advancement through interdisciplinary progression rounds. If new patient transition needs arise, please place a TOC consult.

## 2022-09-22 LAB — GLUCOSE, CAPILLARY: Glucose-Capillary: 163 mg/dL — ABNORMAL HIGH (ref 70–99)

## 2022-09-22 NOTE — Evaluation (Signed)
Physical Therapy Evaluation Patient Details Name: Gabriella [name] MRN: [id number] DOB: 03-20-1956 Today's Date: 09/22/2022  History of Present Illness  67 y.o. female presents [facility] on 09/20/2022 for L2-5 PLIF. Pt with CSF leak post-op PMH includes OA, cancer, DM, HTN, TIA.  Clinical Impression  Pt presents to PT with deficits in functional mobility, gait, balance, endurance, strength, power, cognition. Pt is lethargic throughout session, falling asleep with history and intermittent when sitting up at edge of bed. Pt requires significant physical assistance for all functional mobility and demonstrates poor awareness of falls risk at this time. Pt will benefit from aggressive mobilization in an effort to reduce falls risk and caregiver burden. PT recommends SNF placement at this time, however pt may improve if she is more alert next session.       Recommendations for follow up therapy are one component of a multi-disciplinary discharge planning process, led by the attending physician.  Recommendations may be updated based on patient status, additional functional criteria and insurance authorization.  Follow Up Recommendations Skilled nursing-short term rehab (<3 hours/day) (pt may improve if more alert, however at a high risk for falls currently) Can patient physically be transported by private vehicle: No    Assistance Recommended at Discharge Frequent or constant Supervision/Assistance  Patient can return home with the following  Two people to help with walking and/or transfers;Two people to help with bathing/dressing/bathroom;Assistance with cooking/housework;Assistance with feeding;Direct supervision/assist for medications management;Direct supervision/assist for financial management;Assist for transportation;Help with stairs or ramp for entrance    Equipment Recommendations BSC/3in1;Rolling walker (2 wheels)  Recommendations for Other Services       Functional Status  Assessment Patient has had a recent decline in their functional status and demonstrates the ability to make significant improvements in function in a reasonable and predictable amount of time.     Precautions / Restrictions Precautions Precautions: Fall Required Braces or Orthoses: Spinal Brace Spinal Brace: Lumbar corset;Applied in sitting position (pt's brace is difficult to don, very tight. May need a new brace as this one would be very difficult for her to don independently) Restrictions Weight Bearing Restrictions: No      Mobility  Bed Mobility Overal bed mobility: Needs Assistance Bed Mobility: Rolling, Sidelying to Sit, Sit to Sidelying Rolling: Max assist Sidelying to sit: Max assist     Sit to sidelying: Max assist      Transfers Overall transfer level: Needs assistance Equipment used: Rolling walker (2 wheels), 1 person hand held assist Transfers: Sit to/from Stand Sit to Stand: Max assist           General transfer comment: unsuccessful attempts x2 with RW. Pt requires maxA with face to face transfer to stand    Ambulation/Gait Ambulation/Gait assistance:  (deferred 2/2 weakness and lethargy)                Stairs            Wheelchair Mobility    Modified Rankin (Stroke Patients Only)       Balance Overall balance assessment: Needs assistance Sitting-balance support: Feet supported, Single extremity supported, Bilateral upper extremity supported Sitting balance-Leahy Scale: Poor     Standing balance support: Bilateral upper extremity supported, Reliant on assistive device for balance Standing balance-Leahy Scale: Poor Standing balance comment: minA                             Pertinent Vitals/Pain Pain Assessment Pain  Assessment:  (pt is unable to provide a number due to intermittent lethargy)    Home Living Family/patient expects to be discharged to:: Private residence Living Arrangements: Spouse/significant  other Available Help at Discharge: Family;Available 24 hours/day Type of Home: House Home Access: Stairs to enter Entrance Stairs-Rails: None Entrance Stairs-Number of Steps: 6   Home Layout: One level Home Equipment: Rollator (4 wheels);Wheelchair - Brewing technologist - built in      Prior Function Prior Level of Function : Independent/Modified Independent                     Journalist, newspaper        Extremity/Trunk Assessment   Upper Extremity Assessment Upper Extremity Assessment: Generalized weakness    Lower Extremity Assessment Lower Extremity Assessment: Generalized weakness    Cervical / Trunk Assessment Cervical / Trunk Assessment: Back Surgery  Communication   Communication: No difficulties  Cognition Arousal/Alertness: Lethargic Behavior During Therapy: Flat affect Overall Cognitive Status: Impaired/Different from baseline Area of Impairment: Memory, Attention, Following commands, Safety/judgement, Awareness, Problem solving                   Current Attention Level: Focused Memory: Decreased recall of precautions, Decreased short-term memory Following Commands: Follows one step commands with increased time Safety/Judgement: Decreased awareness of safety, Decreased awareness of deficits Awareness: Intellectual Problem Solving: Slow processing, Decreased initiation General Comments: very limited by lethargy this session        General Comments General comments (skin integrity, edema, etc.): VSS on RA, pt lethargic throughout session    Exercises     Assessment/Plan    PT Assessment Patient needs continued PT services  PT Problem List Decreased strength;Decreased activity tolerance;Decreased balance;Decreased mobility;Decreased cognition;Decreased knowledge of use of DME;Decreased safety awareness;Decreased knowledge of precautions       PT Treatment Interventions DME instruction;Gait training;Stair training;Functional mobility  training;Therapeutic activities;Therapeutic exercise;Balance training;Neuromuscular re-education;Cognitive remediation;Patient/family education    PT Goals (Current goals can be found in the Care Plan section)  Acute Rehab PT Goals Patient Stated Goal: to walk to bathroom PT Goal Formulation: With patient Time For Goal Achievement: 10/06/22 Potential to Achieve Goals: Fair    Frequency Min 5X/week     Co-evaluation               AM-PAC PT "6 Clicks" Mobility  Outcome Measure Help needed turning from your back to your side while in a flat bed without using bedrails?: A Lot Help needed moving from lying on your back to sitting on the side of a flat bed without using bedrails?: A Lot Help needed moving to and from a bed to a chair (including a wheelchair)?: A Lot Help needed standing up from a chair using your arms (e.g., wheelchair or bedside chair)?: A Lot Help needed to walk in hospital room?: Total Help needed climbing 3-5 steps with a railing? : Total 6 Click Score: 10    End of Session Equipment Utilized During Treatment: Gait belt;Back brace Activity Tolerance: Patient limited by lethargy Patient left: in bed;with call bell/phone within reach;with bed alarm set Nurse Communication: Mobility status;Need for lift equipment PT Visit Diagnosis: Other abnormalities of gait and mobility (R26.89);Muscle weakness (generalized) (M62.81)    Time: [id number] PT Time Calculation (min) (ACUTE ONLY): 25 min   Charges:   PT Evaluation $PT Eval Low Complexity: Watts, PT, DPT Acute Rehabilitation Office [phone]   Thurmond Butts  Mechele Dawley 09/22/2022, 2:36 PM

## 2022-09-22 NOTE — Progress Notes (Signed)
   09/22/22 1944  Urine Characteristics  Urinary Incontinence No  Urine Color Amber  Urine Appearance Clear  Urinary Interventions Intermittent/Straight cath  Intermittent/Straight Cath (mL) 200 mL  Intermittent Catheter Size 16  Hygiene Peri care   Pt still d/t void since foley removal this am. Pt attempted to void in the BR but unable to. Pt in and out catheterize per protocol. Peri-care done prior to and afterwards. Pt only had 200 ml output with dark amber urine. Pt endorse inadequate po fluid intake. Pt educated and encourage to increase her oral intake. Pt in bed with spouse at bedside and call light within reach. Oncoming RN notified. Pt remains due to void again. Delia Heady RN

## 2022-09-22 NOTE — Progress Notes (Signed)
Subjective: The patient is alert and pleasant.  She is sore all over.  She wants to mobilize.  She has no headache.  Objective: Vital signs in last 24 hours: Temp:  [98.3 F (36.8 C)-102.5 F (39.2 C)] 98.8 F (37.1 C) (02/24 0700) Pulse Rate:  [80-109] 109 (02/23 2313) Resp:  [14-18] 14 (02/24 0700) BP: (101-154)/(52-75) 139/73 (02/24 0700) SpO2:  [90 %-97 %] 94 % (02/23 2313) Estimated body mass index is 37.53 kg/m as calculated from the following:   Height as of this encounter: '5\' 3"'$  (1.6 m).   Weight as of this encounter: 96.1 kg.   Intake/Output from previous day: 02/23 0701 - 02/24 0700 In: 480.4 [P.O.:480; I.V.:0.4] Out: 2600 [Urine:2600] Intake/Output this shift: No intake/output data recorded.  Physical exam the patient is alert and pleasant.  Her strength is normal.  Lab Results: Recent Labs    09/20/22 1413 09/21/22 0727  WBC  --  9.6  HGB 12.6 11.3*  HCT 37.0 33.2*  PLT  --  205   BMET Recent Labs    09/20/22 1413 09/21/22 0727  NA 140 139  K 4.0 4.1  CL  --  104  CO2  --  27  GLUCOSE  --  143*  BUN  --  15  CREATININE  --  0.60  CALCIUM  --  7.9*    Studies/Results: DG Lumbar Spine 1 View  Result Date: 09/20/2022 CLINICAL DATA:  Rule out retained foreign body. EXAM: LUMBAR SPINE - 1 VIEW COMPARISON:  Earlier radiographs, same date. FINDINGS: No unexpected radiopaque foreign body is identified. Fusion hardware appears to be in good position without complicating features. IMPRESSION: No unexpected radiopaque foreign body. Electronically Signed   By: Marijo Sanes M.D.   On: 09/20/2022 14:53   DG Lumbar Spine 2-3 Views  Result Date: 09/20/2022 CLINICAL DATA:  Elective surgery. EXAM: LUMBAR SPINE - 2-3 VIEW COMPARISON:  Preoperative imaging demonstrating 5 non-rib-bearing lumbar vertebra. FINDINGS: Two fluoroscopic spot views of the lumbar spine obtained in frontal and lateral projections. Intrapedicular screws with interbody spacers from L2  through L5. Fluoroscopy time 27 seconds. Dose 41.55 mGy. IMPRESSION: Intraoperative fluoroscopy for L2-L5 fusion. Electronically Signed   By: Keith Rake M.D.   On: 09/20/2022 14:45   DG Lumbar Spine 1 View  Result Date: 09/20/2022 CLINICAL DATA:  Elective surgery. Intraoperative localization. EXAM: LUMBAR SPINE - 1 VIEW COMPARISON:  Preoperative radiograph 10/31/2021 demonstrating 5 non-rib-bearing lumbar vertebra. FINDINGS: Single cross-table lateral view of the lumbar spine obtained in the operating room. Low resolution image. Surgical instruments localize posteriorly projecting over the L3 spinous process. IMPRESSION: Surgical instruments posteriorly projecting over the L3 spinous process. Electronically Signed   By: Keith Rake M.D.   On: 09/20/2022 14:44   DG C-Arm 1-60 Min-No Report  Result Date: 09/20/2022 Fluoroscopy was utilized by the requesting physician.  No radiographic interpretation.   DG C-Arm 1-60 Min-No Report  Result Date: 09/20/2022 Fluoroscopy was utilized by the requesting physician.  No radiographic interpretation.    Assessment/Plan: Postop day #2: We elevate her head of bed .  If she does not have a worsening headache at 45 degrees we can begin to mobilize her.  LOS: 2 days     [name] 09/22/2022, 8:54 AM     Patient ID: Gabriella [name], female   DOB: 1956/03/26, 67 y.o.   MRN: [id number]

## 2022-09-23 NOTE — Progress Notes (Signed)
Physical Therapy Treatment Patient Details Name: Gabriella [name] MRN: [id number] DOB: January 30, 1956 Today's Date: 09/23/2022   History of Present Illness 67 y.o. female presents to [facility] hospital on 09/20/2022 for L2-5 PLIF. Pt with CSF leak post-op PMH includes OA, cancer, DM, HTN, TIA.    PT Comments    Pt reporting moderate back and leg pain, as well as L thigh and buttocks numbness, upon PT arrival to room. Pt overall mobilizing better than previous evaluation, but continues to requires max safety cuing and up to moderate physical assist for transfers and gait. Pt ambulated to/from bathroom and to hallway before reporting legs felt increasingly weak and back pain was getting worse. PT feels pt would benefit from ST-SNF placement, but per pt's husband he will do "whatever it takes" to get pt home. PT will continue to progress pt as able.     Recommendations for follow up therapy are one component of a multi-disciplinary discharge planning process, led by the attending physician.  Recommendations may be updated based on patient status, additional functional criteria and insurance authorization.  Follow Up Recommendations  Skilled nursing-short term rehab (<3 hours/day) (pt and family request d/c home, therefore recommend HHPT and 24/7 care for pt) Can patient physically be transported by private vehicle: Yes   Assistance Recommended at Discharge Frequent or constant Supervision/Assistance  Patient can return home with the following A lot of help with walking and/or transfers;A lot of help with bathing/dressing/bathroom   Equipment Recommendations  BSC/3in1;Rolling walker (2 wheels)    Recommendations for Other Services       Precautions / Restrictions Precautions Precautions: Fall Required Braces or Orthoses: Spinal Brace Spinal Brace: Lumbar corset;Applied in sitting position (pt's brace is difficult to don, very tight. May need a new brace as this one would be very difficult for her to  don independently) Restrictions Weight Bearing Restrictions: No     Mobility  Bed Mobility Overal bed mobility: Needs Assistance Bed Mobility: Rolling, Sidelying to Sit, Sit to Sidelying Rolling: Mod assist Sidelying to sit: Mod assist     Sit to sidelying: Mod assist, +2 for physical assistance General bed mobility comments: mod +1-2 for log roll tehcnique, trunk rise and lower, LE lift back into bed.    Transfers Overall transfer level: Needs assistance Equipment used: Rolling walker (2 wheels) Transfers: Sit to/from Stand Sit to Stand: Mod assist, From elevated surface           General transfer comment: assist for power up, rise, steadying. STS x4, from EOB x3 and BSC over toilet x1.    Ambulation/Gait Ambulation/Gait assistance: Min assist Gait Distance (Feet): 25 Feet (+10 to reach bathroom) Assistive device: Rolling walker (2 wheels) Gait Pattern/deviations: Step-through pattern, Decreased stride length, Decreased dorsiflexion - left, Decreased dorsiflexion - right, Trunk flexed Gait velocity: decr     General Gait Details: assist to steady and guide RW around obstacles in room, cues for upright posture and maintaining back precautions during mobility   Stairs             Wheelchair Mobility    Modified Rankin (Stroke Patients Only)       Balance Overall balance assessment: Needs assistance Sitting-balance support: Feet supported, Single extremity supported, Bilateral upper extremity supported Sitting balance-Leahy Scale: Fair     Standing balance support: Bilateral upper extremity supported, Reliant on assistive device for balance Standing balance-Leahy Scale: Poor Standing balance comment: reliant on PT assist and RW  Cognition Arousal/Alertness: Awake/alert Behavior During Therapy: WFL for tasks assessed/performed Overall Cognitive Status: Impaired/Different from baseline Area of Impairment: Memory,  Attention, Following commands, Safety/judgement, Awareness, Problem solving                   Current Attention Level: Focused Memory: Decreased recall of precautions, Decreased short-term memory Following Commands: Follows one step commands with increased time Safety/Judgement: Decreased awareness of safety, Decreased awareness of deficits Awareness: Emergent Problem Solving: Slow processing, Decreased initiation General Comments: pt requires significant safety cues throughout mobility, pt at times disregards PT cues (i.e. PT instructed pt to keep holding onto the RW as opposed to letting go with one hand to reach for footboard when walking around the bed, pt disregards cue).        Exercises      General Comments        Pertinent Vitals/Pain Pain Assessment Pain Assessment: Faces Faces Pain Scale: Hurts even more Pain Location: back Pain Descriptors / Indicators: Sore Pain Intervention(s): Limited activity within patient's tolerance, Monitored during session, Repositioned    Home Living                          Prior Function            PT Goals (current goals can now be found in the care plan section) Acute Rehab PT Goals Patient Stated Goal: to walk to bathroom PT Goal Formulation: With patient Time For Goal Achievement: 10/06/22 Potential to Achieve Goals: Fair Progress towards PT goals: Progressing toward goals    Frequency    Min 5X/week      PT Plan Current plan remains appropriate    Co-evaluation              AM-PAC PT "6 Clicks" Mobility   Outcome Measure  Help needed turning from your back to your side while in a flat bed without using bedrails?: A Lot Help needed moving from lying on your back to sitting on the side of a flat bed without using bedrails?: A Lot Help needed moving to and from a bed to a chair (including a wheelchair)?: A Lot Help needed standing up from a chair using your arms (e.g., wheelchair or bedside  chair)?: A Lot Help needed to walk in hospital room?: A Lot Help needed climbing 3-5 steps with a railing? : A Lot 6 Click Score: 12    End of Session Equipment Utilized During Treatment: Back brace Activity Tolerance: Patient limited by pain;Patient limited by fatigue Patient left: in bed;with call bell/phone within reach;with bed alarm set;with family/visitor present Nurse Communication: Mobility status;Patient requests pain meds PT Visit Diagnosis: Other abnormalities of gait and mobility (R26.89);Muscle weakness (generalized) (M62.81)     Time: [id number] PT Time Calculation (min) (ACUTE ONLY): 45 min  Charges:  $Gait Training: 8-22 mins $Therapeutic Activity: 23-37 mins                     Stacie Glaze, PT DPT Acute Rehabilitation Services Pager [phone]  Office [phone]    [place] 09/23/2022, 1:52 PM

## 2022-09-23 NOTE — Progress Notes (Signed)
Subjective: The patient is alert and pleasant.  She is appropriately sore.  She denies headaches.  She has been ambulating short distances.  Objective: Vital signs in last 24 hours: Temp:  [98.1 F (36.7 C)-100.3 F (37.9 C)] 98.4 F (36.9 C) (02/25 0745) Pulse Rate:  [85-109] 109 (02/25 0748) Resp:  [14-18] 18 (02/25 0745) BP: (91-121)/(57-70) 113/70 (02/25 0745) SpO2:  [90 %-95 %] 95 % (02/25 0748) Estimated body mass index is 37.53 kg/m as calculated from the following:   Height as of this encounter: '5\' 3"'$  (1.6 m).   Weight as of this encounter: 96.1 kg.   Intake/Output from previous day: 02/24 0701 - 02/25 0700 In: -  Out: 1595 [Urine:1595] Intake/Output this shift: No intake/output data recorded.  Physical exam the patient is alert and pleasant.  She looks well.  Her strength is normal.  Her dressing has a small amount of blood staining.  Lab Results: Recent Labs    09/20/22 1413 09/21/22 0727  WBC  --  9.6  HGB 12.6 11.3*  HCT 37.0 33.2*  PLT  --  205   BMET Recent Labs    09/20/22 1413 09/21/22 0727  NA 140 139  K 4.0 4.1  CL  --  104  CO2  --  27  GLUCOSE  --  143*  BUN  --  15  CREATININE  --  0.60  CALCIUM  --  7.9*    Studies/Results: No results found.  Assessment/Plan: Postop day #3: The patient is progressing slowly but well.  We will continue to mobilize with PT.  Hopefully she will go home in the next day or 2.  LOS: 3 days     [name] 09/23/2022, 9:26 AM     Patient ID: Gabriella [name], female   DOB: 03/20/1956, 67 y.o.   MRN: [id number]

## 2022-09-24 NOTE — TOC Initial Note (Addendum)
Transition of Care (TOC) - Initial/Assessment Note  Marvetta Gibbons RN,BSN Transitions of Care Unit 4NP (Non Trauma)- RN Case Manager See Treatment Team for direct Phone #   Patient Details  Name: Gabriella [name] MRN: [id number] Date of Birth: October 07, 1955  Transition of Care [facility]) CM/SW Contact:    Dawayne Patricia, RN Phone Number: 09/24/2022, 12:05 PM  Clinical Narrative:                 Pt seen at bedside along with spouse present, discussed transition recommendations for SNF rehab vs Home w/ HH. Pt and spouse voiced that they prefer to go home with [facility]- however want to work with therapy here to get pt stronger in order to go home. Explained differences between SNF rehab vs HH therapy intensities. After discussion pt and spouse still want to return home w/ [facility] providing pt continues to progress with therapies here in order for spouse to be able to assist at home safely.   Pt voiced she already as RW at home as well as elevated toilet seat, discussed BSC and pt/spouse do not feel she will need BSC as her bathrooms are close to bedroom and other rooms she will be in.   Address, phone# and PCP all confirmed, choice offered for [facility] agencies per Medicare guidelines and list offered but declined- pt and spouse defer to CM to secure on pt's behalf with highest star rating as possible that will accept pt's insurance.   Pt to continue to work with PT and mobility, will follow for progress- will need [place] orders for [facility] referral prior to discharge  Expected Discharge Plan: [place] Barriers to Discharge: Continued Medical Work up   Patient Goals and CMS Choice Patient states their goals for this hospitalization and ongoing recovery are:: return home CMS Medicare.gov Compare Post Acute Care list provided to:: Patient Choice offered to / list presented to : Patient, Spouse      Expected Discharge Plan and Services   Discharge Planning Services: CM Consult Post Acute Care  Choice: [place] arrangements for the past 2 months: Single Family Home                                      Prior Living Arrangements/Services Living arrangements for the past 2 months: Single Family Home Lives with:: Spouse Patient language and need for interpreter reviewed:: Yes Do you feel safe going back to the place where you live?: Yes      Need for Family Participation in Patient Care: Yes (Comment) Care giver support system in place?: Yes (comment) Current home services: DME (rolling walker, elevated toilet seat,) Criminal Activity/Legal Involvement Pertinent to Current Situation/Hospitalization: No - Comment as needed  Activities of Daily Living      Permission Sought/Granted Permission sought to share information with : Facility Art therapist granted to share information with : Yes, Verbal Permission Granted     Permission granted to share info w AGENCY: HH        Emotional Assessment Appearance:: Appears stated age Attitude/Demeanor/Rapport: Engaged Affect (typically observed): Accepting, Appropriate Orientation: : Oriented to Self, Oriented to Place, Oriented to  Time, Oriented to Situation Alcohol / Substance Use: Not Applicable Psych Involvement: No (comment)  Admission diagnosis:  Spondylolisthesis [M43.10] Patient Active Problem List   Diagnosis Date Noted   Spondylolisthesis 09/20/2022   TIA (transient ischemic attack) 09/05/2020  Gait abnormality 09/05/2020   Diabetes mellitus without complication ([place]) 0000000   S/P reverse total shoulder arthroplasty, 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   Chronic back pain 10/05/2015   Musculoskeletal pain 02/23/2014   Proteinuria [id number]   Metabolic syndrome A999333   Foot pain 08/31/2012   Achilles tendinitis 08/31/2012   Glucose intolerance (impaired glucose tolerance) 05/27/2011   Hyperlipidemia 05/27/2011   Hypertension 05/27/2011   Obesity 05/27/2011   History of peptic  ulcer disease 05/27/2011   PCP:  Elby Showers, MD Pharmacy:   [id number] [place] [id number]: [phone]: [phone]    Social Determinants of Health (SDOH) Social History: SDOH Screenings   Depression (PHQ2-9): Low Risk  (09/17/2022)  Tobacco Use: High Risk (09/20/2022)   SDOH Interventions:     Readmission Risk Interventions     No data to display

## 2022-09-24 NOTE — Care Management Important Message (Signed)
Important Message  Patient Details  Name: Gabriella [name] MRN: [id number] Date of Birth: 1956/07/07   Medicare Important Message Given:  Yes     Hannah Beat 09/24/2022, 3:06 PM

## 2022-09-24 NOTE — Progress Notes (Signed)
Physical Therapy Treatment Patient Details Name: Gabriella [name] MRN: [id number] DOB: 12/22/1955 Today's Date: 09/24/2022   History of Present Illness 67 y.o. female presents to [facility] hospital on 09/20/2022 for L2-5 PLIF. Pt with CSF leak post-op PMH includes OA, cancer, DM, HTN, TIA.    PT Comments    Patient progressing with ambulation and mobility though using bed features did not need assist for supine to sit.  She has spouse to assist at home and is eager for home despite pain in hips and concern for no BM since surgery.  Given progression feel stable to return home with HHPT instead of SNF.  PT will continue to follow.    Recommendations for follow up therapy are one component of a multi-disciplinary discharge planning process, led by the attending physician.  Recommendations may be updated based on patient status, additional functional criteria and insurance authorization.  Follow Up Recommendations  Home health PT Can patient physically be transported by private vehicle: Yes   Assistance Recommended at Discharge Frequent or constant Supervision/Assistance  Patient can return home with the following A little help with walking and/or transfers;A little help with bathing/dressing/bathroom   Equipment Recommendations  BSC/3in1;Rolling walker (2 wheels)    Recommendations for Other Services       Precautions / Restrictions Precautions Precautions: Fall;Back Required Braces or Orthoses: Spinal Brace Spinal Brace: Lumbar corset;Applied in sitting position     Mobility  Bed Mobility Overal bed mobility: Needs Assistance Bed Mobility: Rolling, Sidelying to Sit Rolling: Supervision Sidelying to sit: Supervision     Sit to sidelying: Mod assist General bed mobility comments: increased time and use of bed features to sit up, but able to do without PT assist; to supine assist for legs onto bed after cues for positioning    Transfers Overall transfer level: Needs  assistance Equipment used: Rolling walker (2 wheels) Transfers: Sit to/from Stand Sit to Stand: Min assist, From elevated surface           General transfer comment: elevated height of bed and then from [facility] increased time with couple of attempts to stand with A for anaterior weight shift    Ambulation/Gait Ambulation/Gait assistance: Min guard Gait Distance (Feet): 100 Feet Assistive device: Rolling walker (2 wheels) Gait Pattern/deviations: Step-through pattern, Step-to pattern, Decreased stride length, Trendelenburg, Shuffle, Wide base of support       General Gait Details: decreased foot clearance bilaterally, assist for balance throughout and noted Trendelenberg on L   Stairs             Wheelchair Mobility    Modified Rankin (Stroke Patients Only)       Balance Overall balance assessment: Needs assistance   Sitting balance-Leahy Scale: Fair Sitting balance - Comments: toileted on elevated 3:1 over toilet and using grabbars extensively to stand   Standing balance support: Bilateral upper extremity supported Standing balance-Leahy Scale: Poor Standing balance comment: UE support on walker and PT assist for toilet hygiene                            Cognition Arousal/Alertness: Awake/alert Behavior During Therapy: WFL for tasks assessed/performed Overall Cognitive Status: Within Functional Limits for tasks assessed                                          Exercises  General Comments General comments (skin integrity, edema, etc.): reviewed back precautions, pt reports no BM in several days, RN in to deliver suppository at end of session      Pertinent Vitals/Pain Pain Assessment Faces Pain Scale: Hurts even more Pain Location: bilateral hips R worse than L Pain Descriptors / Indicators: Aching, Discomfort Pain Intervention(s): Monitored during session, Repositioned    Home Living                           Prior Function            PT Goals (current goals can now be found in the care plan section) Progress towards PT goals: Progressing toward goals    Frequency    Min 5X/week      PT Plan Discharge plan needs to be updated    Co-evaluation              AM-PAC PT "6 Clicks" Mobility   Outcome Measure  Help needed turning from your back to your side while in a flat bed without using bedrails?: A Little Help needed moving from lying on your back to sitting on the side of a flat bed without using bedrails?: A Lot Help needed moving to and from a bed to a chair (including a wheelchair)?: A Little Help needed standing up from a chair using your arms (e.g., wheelchair or bedside chair)?: A Little Help needed to walk in hospital room?: A Little Help needed climbing 3-5 steps with a railing? : Total 6 Click Score: 15    End of Session Equipment Utilized During Treatment: Gait belt;Back brace Activity Tolerance: Patient limited by fatigue Patient left: in bed;with call bell/phone within reach Nurse Communication: Other (comment) (suppository) PT Visit Diagnosis: Other abnormalities of gait and mobility (R26.89);Muscle weakness (generalized) (M62.81);Pain Pain - Right/Left: Right (both) Pain - part of body: Hip     Time: 1320-1400 PT Time Calculation (min) (ACUTE ONLY): 40 min  Charges:  $Gait Training: 8-22 mins $Therapeutic Activity: 8-22 mins                     Gabriella [name], PT Acute Rehabilitation Services [phone] 09/24/2022    Gabriella [name] 09/24/2022, 2:30 PM

## 2022-09-24 NOTE — Progress Notes (Signed)
Subjective:   The patient is alert and pleasant.  She looks and feels better.  She has been slow to mobilize.  Objective: Vital signs in last 24 hours: Temp:  [98.4 F (36.9 C)-100.1 F (37.8 C)] 99.6 F (37.6 C) (02/26 0732) Pulse Rate:  [74-98] 80 (02/26 0732) Resp:  [17-20] 18 (02/26 0732) BP: (101-131)/(57-73) 127/73 (02/26 0732) SpO2:  [95 %-96 %] 96 % (02/25 1612) Estimated body mass index is 37.53 kg/m as calculated from the following:   Height as of this encounter: '5\' 3"'$  (1.6 m).   Weight as of this encounter: 96.1 kg.   Intake/Output from previous day: No intake/output data recorded. Intake/Output this shift: No intake/output data recorded.  Physical exam   The patient is alert and pleasant.  Her strength is normal.  Lab Results: No results for input(s): "WBC", "HGB", "HCT", "PLT" in the last 72 hours. BMET No results for input(s): "NA", "K", "CL", "CO2", "GLUCOSE", "BUN", "CREATININE", "CALCIUM" in the last 72 hours.  Studies/Results: No results found.  Assessment/Plan:   Postop day 4.:  I will ask rehab the see the patient to see if she would benefit from inpatient rehabilitation.  LOS: 4 days     [name] 09/24/2022, 11:12 AM     Patient ID: Gabriella [name], female   DOB: 1955/12/27, 67 y.o.   MRN: [id number]

## 2022-09-25 NOTE — Progress Notes (Signed)
Subjective: The patient is alert and pleasant.  She has been slow to mobilize.  Objective: Vital signs in last 24 hours: Temp:  [97.9 F (36.6 C)-100.2 F (37.9 C)] 98.4 F (36.9 C) (02/27 0340) Pulse Rate:  [82-89] 82 (02/26 1534) Resp:  [19-20] 20 (02/27 0340) BP: (95-126)/(58-79) 97/58 (02/27 0340) Estimated body mass index is 37.53 kg/m as calculated from the following:   Height as of this encounter: '5\' 3"'$  (1.6 m).   Weight as of this encounter: 96.1 kg.   Intake/Output from previous day: 02/26 0701 - 02/27 0700 In: -  Out: 450 [Urine:450] Intake/Output this shift: No intake/output data recorded.  Physical exam the patient is alert and pleasant.  Her strength is normal.  Her dressing is clean and dry.  Her wound is healing well.  Lab Results: No results for input(s): "WBC", "HGB", "HCT", "PLT" in the last 72 hours. BMET No results for input(s): "NA", "K", "CL", "CO2", "GLUCOSE", "BUN", "CREATININE", "CALCIUM" in the last 72 hours.  Studies/Results: No results found.  Assessment/Plan: Postop day #5: We will continue to mobilize with PT.  The plan is to send her home tomorrow with home PT.  I have answered all her questions.  LOS: 5 days     [name] 09/25/2022, 8:00 AM     Patient ID: Gabriella [name], female   DOB: [birth year], 66 y.o.   MRN: [id number]

## 2022-09-25 NOTE — Progress Notes (Signed)
Inpatient Rehab Admissions Coordinator:   Consult received and chart reviewed.  Appears plan to d/c home tomorrow with [facility].  Would not be able to get insurance auth with SNF or HH recs.    Shann Medal, PT, DPT Admissions Coordinator [phone] 09/25/22  9:33 AM

## 2022-09-25 NOTE — Progress Notes (Signed)
Physical Therapy Treatment Patient Details Name: Gabriella [name] MRN: [id number] DOB: [birth year] Today's Date: 09/25/2022   History of Present Illness 67 y.o. female presents to [facility] hospital on 09/20/2022 for L2-5 PLIF. Pt with CSF leak post-op PMH includes OA, cancer, DM, HTN, TIA.    PT Comments    Patient progressing with mobility able to attempt steps today though unable to achieve more than one step after trialing different techniques.  She walked further and was able to practice getting out and in bed from side she uses at home.  Feel she will continue to progress with HHPT and spouse assist at d/c.    Recommendations for follow up therapy are one component of a multi-disciplinary discharge planning process, led by the attending physician.  Recommendations may be updated based on patient status, additional functional criteria and insurance authorization.  Follow Up Recommendations  Home health PT Can patient physically be transported by private vehicle: Yes   Assistance Recommended at Discharge Frequent or constant Supervision/Assistance  Patient can return home with the following A little help with walking and/or transfers;A little help with bathing/dressing/bathroom   Equipment Recommendations  BSC/3in1;Rolling walker (2 wheels)    Recommendations for Other Services       Precautions / Restrictions Precautions Precautions: Fall;Back Spinal Brace: Lumbar corset;Applied in sitting position     Mobility  Bed Mobility Overal bed mobility: Needs Assistance Bed Mobility: Rolling, Sidelying to Sit Rolling: Supervision Sidelying to sit: Supervision     Sit to sidelying: Mod assist General bed mobility comments: increased time and use of bed features and rail to sit up, assist for legs to supine    Transfers Overall transfer level: Needs assistance Equipment used: Rolling walker (2 wheels) Transfers: Sit to/from Stand Sit to Stand: Min assist, From elevated surface            General transfer comment: increased time, but able to rise with only a little help to stand from EOB    Ambulation/Gait Ambulation/Gait assistance: Supervision, Min guard Gait Distance (Feet): 200 Feet Assistive device: Rolling walker (2 wheels) Gait Pattern/deviations: Step-through pattern, Step-to pattern, Decreased stride length, Trunk flexed, Trendelenburg       General Gait Details: hip instability noted and knees with genuvalgus deformities, pt wearing shoes and reported improved stability   Stairs Stairs: Yes Stairs assistance: Mod assist Stair Management: Step to pattern, Sideways, Backwards, With walker, One rail Right Number of Stairs: 1 General stair comments: attempted sideways technique with both hands on one rail with difficulty getting lift so performed with reverse technique with RW and mod A.   Wheelchair Mobility    Modified Rankin (Stroke Patients Only)       Balance Overall balance assessment: Needs assistance   Sitting balance-Leahy Scale: Good Sitting balance - Comments: assist to don socks and shoes in sitting, pt able to doff socks and shoes and don slipper socks with crossed leg technique   Standing balance support: Bilateral upper extremity supported Standing balance-Leahy Scale: Poor Standing balance comment: UE support on walker                            Cognition Arousal/Alertness: Awake/alert Behavior During Therapy: WFL for tasks assessed/performed Overall Cognitive Status: Within Functional Limits for tasks assessed  Exercises      General Comments General comments (skin integrity, edema, etc.): cues for back precautions with sit to supine especially      Pertinent Vitals/Pain Pain Assessment Pain Assessment: Faces Faces Pain Scale: Hurts whole lot Pain Location: low back Pain Descriptors / Indicators: Aching, Discomfort Pain Intervention(s):  Monitored during session, Patient requesting pain meds-RN notified, Repositioned    Home Living                          Prior Function            PT Goals (current goals can now be found in the care plan section) Progress towards PT goals: Progressing toward goals    Frequency    Min 5X/week      PT Plan Current plan remains appropriate    Co-evaluation              AM-PAC PT "6 Clicks" Mobility   Outcome Measure    Help needed moving from lying on your back to sitting on the side of a flat bed without using bedrails?: A Little Help needed moving to and from a bed to a chair (including a wheelchair)?: A Little Help needed standing up from a chair using your arms (e.g., wheelchair or bedside chair)?: A Little Help needed to walk in hospital room?: A Little Help needed climbing 3-5 steps with a railing? : A Lot 6 Click Score: 14    End of Session Equipment Utilized During Treatment: Back brace Activity Tolerance: Patient tolerated treatment well Patient left: in bed;with call bell/phone within reach   PT Visit Diagnosis: Other abnormalities of gait and mobility (R26.89);Muscle weakness (generalized) (M62.81);Pain Pain - part of body:  (back)     Time: [id number] PT Time Calculation (min) (ACUTE ONLY): 27 min  Charges:  $Gait Training: 8-22 mins $Therapeutic Activity: 8-22 mins                     [name], PT Acute Rehabilitation Services [phone] 09/25/2022    [name] 09/25/2022, 5:42 PM

## 2022-09-25 NOTE — Progress Notes (Signed)
Mobility Specialist Progress Note    09/25/22 1043  Mobility  Activity Ambulated with assistance in hallway;Ambulated with assistance to bathroom  Level of Assistance Minimal assist, patient does 75% or more  Assistive Device Front wheel walker  Distance Ambulated (ft) 230 ft  Activity Response Tolerated well  Mobility Referral Yes  $Mobility charge 1 Mobility   Pt received in bed and agreeable. Pt requiring minA to stand from elevated surface. C/o 10/10 pain with activity. Returned to BR to void then to sitting EOB with call bell in reach.   Hildred Alamin Mobility Specialist  Please Psychologist, sport and exercise or Rehab Office at [phone]

## 2022-09-26 MED ORDER — OXYCODONE-ACETAMINOPHEN 5-325 MG PO TABS: 1.0000 | ORAL_TABLET | ORAL | Status: DC | PRN

## 2022-09-26 NOTE — Progress Notes (Signed)
Mobility Specialist Progress Note    09/26/22 1150  Mobility  Activity Ambulated with assistance in hallway  Level of Assistance Minimal assist, patient does 75% or more  Assistive Device Front wheel walker  Distance Ambulated (ft) 230 ft  Activity Response Tolerated well  Mobility Referral Yes  $Mobility charge 1 Mobility   Pt received sitting EOB and agreeable. C/o pain on walk. Returned to sitting EOB with call bell in reach.   Hildred Alamin Mobility Specialist  Please Psychologist, sport and exercise or Rehab Office at [phone]

## 2022-09-26 NOTE — Discharge Summary (Cosign Needed Addendum)
Physician Discharge Summary     Providing Compassionate, Quality Care - Together   Patient ID: Gabriella [name] MRN: [id number] DOB/AGE: 09/17/1955 67 y.o.  Admit date: 09/20/2022 Discharge date: 09/26/2022  Admission Diagnoses: Spondylolisthesis  Discharge Diagnoses:  Principal Problem:   Spondylolisthesis   Discharged Condition: good  Hospital Course: Patient underwent an L2-3, L3-4, L4-5 PLIF by Dr. Arnoldo Morale on 09/20/2022. She was admitted to [facility] following recovery from anesthesia in the PACU. Her postoperative course has been uncomplicated. She has worked with physical therapy who feels the patient is ready for discharge home with Home Health PT. She is ambulating with the aid of a walker. She is tolerating a normal diet. She is not having any bowel or bladder dysfunction. Her pain is well-controlled with oral pain medication. She is ready for discharge home.   Consults: PT  Significant Diagnostic Studies: radiology: DG Lumbar Spine 1 View  Result Date: 09/20/2022 CLINICAL DATA:  Rule out retained foreign body. EXAM: LUMBAR SPINE - 1 VIEW COMPARISON:  Earlier radiographs, same date. FINDINGS: No unexpected radiopaque foreign body is identified. Fusion hardware appears to be in good position without complicating features. IMPRESSION: No unexpected radiopaque foreign body. Electronically Signed   By: Marijo Sanes M.D.   On: 09/20/2022 14:53   DG Lumbar Spine 2-3 Views  Result Date: 09/20/2022 CLINICAL DATA:  Elective surgery. EXAM: LUMBAR SPINE - 2-3 VIEW COMPARISON:  Preoperative imaging demonstrating 5 non-rib-bearing lumbar vertebra. FINDINGS: Two fluoroscopic spot views of the lumbar spine obtained in frontal and lateral projections. Intrapedicular screws with interbody spacers from L2 through L5. Fluoroscopy time 27 seconds. Dose 41.55 mGy. IMPRESSION: Intraoperative fluoroscopy for L2-L5 fusion. Electronically Signed   By: Keith Rake M.D.   On: 09/20/2022  14:45   DG Lumbar Spine 1 View  Result Date: 09/20/2022 CLINICAL DATA:  Elective surgery. Intraoperative localization. EXAM: LUMBAR SPINE - 1 VIEW COMPARISON:  Preoperative radiograph 10/31/2021 demonstrating 5 non-rib-bearing lumbar vertebra. FINDINGS: Single cross-table lateral view of the lumbar spine obtained in the operating room. Low resolution image. Surgical instruments localize posteriorly projecting over the L3 spinous process. IMPRESSION: Surgical instruments posteriorly projecting over the L3 spinous process. Electronically Signed   By: Keith Rake M.D.   On: 09/20/2022 14:44   DG C-Arm 1-60 Min-No Report  Result Date: 09/20/2022 Fluoroscopy was utilized by the requesting physician.  No radiographic interpretation.   DG C-Arm 1-60 Min-No Report  Result Date: 09/20/2022 Fluoroscopy was utilized by the requesting physician.  No radiographic interpretation.   MM 3D SCREEN BREAST BILATERAL  Result Date: 08/02/2022 CLINICAL DATA:  Screening. EXAM: DIGITAL SCREENING BILATERAL MAMMOGRAM WITH TOMOSYNTHESIS AND CAD TECHNIQUE: Bilateral screening digital craniocaudal and mediolateral oblique mammograms were obtained. Bilateral screening digital breast tomosynthesis was performed. The images were evaluated with computer-aided detection. COMPARISON:  Previous exam(s). ACR Breast Density Category b: There are scattered areas of fibroglandular density. FINDINGS: There are no findings suspicious for malignancy. IMPRESSION: No mammographic evidence of malignancy. A result letter of this screening mammogram will be mailed directly to the patient. RECOMMENDATION: Screening mammogram in one year. (Code:SM-B-01Y) BI-RADS CATEGORY  1: Negative. Electronically Signed   By: Franki Cabot M.D.   On: 08/02/2022 11:41     Treatments: surgery: Bilateral L2-3, L3-4 and L4-5 laminotomy/foraminotomies/medial facetectomy to decompress the bilateral L3, L4 and L5 nerve roots(the work required to do this was in  addition to the work required to do the posterior lumbar interbody fusion because of the patient's spinal stenosis, facet  arthropathy. Etc. requiring a wide decompression of the nerve roots.);  Left L2-3, L3-4 and L4-5 transforaminal lumbar interbody fusion with local morselized autograft bone and Zimmer DBM; insertion of interbody prosthesis at L2-3, L3-4 and L4-5 (globus peek expandable interbody prosthesis); posterior segmental instrumentation from L2 to L5 with globus titanium pedicle screws and rods; posterior lateral arthrodesis at L2-3, L3-4 and L4-5 with local morselized autograft bone and Zimmer DBM.   Discharge Exam: Blood pressure 102/62, pulse 80, temperature 98.8 F (37.1 C), temperature source Oral, resp. rate 15, height '5\' 3"'$  (1.6 m), weight 96.1 kg, SpO2 95 %.  Per report: Alert and oriented x 4 PERRLA CN II-XII grossly intact MAE, Strength and sensation intact Incision is covered with Honeycomb dressing and Steri Strips; Dressing is clean, dry, and intact   Disposition: Discharge disposition: 06-Home-Health Care Svc       Discharge Instructions     Call MD for:  difficulty breathing, headache or visual disturbances   Complete by: As directed    Call MD for:  hives   Complete by: As directed    Call MD for:  persistant nausea and vomiting   Complete by: As directed    Call MD for:  redness, tenderness, or signs of infection (pain, swelling, redness, odor or green/yellow discharge around incision site)   Complete by: As directed    Call MD for:  severe uncontrolled pain   Complete by: As directed    Call MD for:  temperature >100.4   Complete by: As directed    DME Bedside commode   Complete by: As directed    Patient needs a bedside commode to treat with the following condition: Lumbar stenosis   Diet - low sodium heart healthy   Complete by: As directed    Face-to-face encounter (required for Medicare/Medicaid patients)   Complete by: As directed    I Patricia Nettle certify that this patient is under my care and that I, or a nurse practitioner or physician's assistant working with me, had a face-to-face encounter that meets the physician face-to-face encounter requirements with this patient on 09/26/2022. The encounter with the patient was in whole, or in part for the following medical condition(s) which is the primary reason for home health care (List medical condition): lumbar spinal stenosis   The encounter with the patient was in whole, or in part, for the following medical condition, which is the primary reason for home health care: Lumbar spinal stenosis   I certify that, based on my findings, the following services are medically necessary home health services: Physical therapy   Reason for Medically Necessary Home Health Services: Therapy- Therapeutic Exercises to Increase Strength and Endurance   My clinical findings support the need for the above services: Unable to leave home safely without assistance and/or assistive device   Further, I certify that my clinical findings support that this patient is homebound due to: Unable to leave home safely without assistance   Home Health   Complete by: As directed    To provide the following care/treatments: PT   Increase activity slowly   Complete by: As directed    No wound care   Complete by: As directed    Remove dressing in 48 hours   Complete by: As directed    Walker rolling   Complete by: As directed       Allergies as of 09/26/2022   No Known Allergies      Medication List  TAKE these medications    allopurinol 300 MG tablet Commonly known as: ZYLOPRIM TAKE 1 TABLET BY MOUTH EVERY DAY   amLODipine 10 MG tablet Commonly known as: NORVASC TAKE 1 TABLET BY MOUTH EVERY DAY   aspirin EC 81 MG tablet Take 81 mg by mouth daily.   Cholecalciferol 100 MCG (4000 UT) Caps Take 4,000 Units by mouth daily.   cyclobenzaprine 5 MG tablet Commonly known as: FLEXERIL Take 5 mg by  mouth 3 (three) times daily as needed for muscle spasms.   diazepam 5 MG tablet Commonly known as: VALIUM TAKE 1 TABLET (5 MG TOTAL) BY MOUTH EVERY 12 (TWELVE) HOURS AS NEEDED. FOR ANXIETY What changed:  when to take this additional instructions   diclofenac Sodium 1 % Gel Commonly known as: VOLTAREN Apply 1 Application topically 4 (four) times daily as needed (pain).   Fish Oil 1000 MG Caps Take 2,000 mg by mouth daily.   furosemide 40 MG tablet Commonly known as: LASIX TAKE 1 TABLET BY MOUTH EVERY DAY   gabapentin 400 MG capsule Commonly known as: NEURONTIN Take 800 mg by mouth See admin instructions. Take 800 mg daily, may take a second 800 mg dose as needed for pain   HYDROcodone-acetaminophen 10-325 MG tablet Commonly known as: NORCO Take 1 tablet by mouth every 4 (four) hours as needed for pain. Stop MSIR.   HYDROmorphone 2 MG tablet Commonly known as: DILAUDID Take 1 tablet (2 mg total) by mouth every 4 (four) hours as needed for pain. Take with Hydrocodone/Apap for post op pain control.   Klor-Con M20 20 MEQ tablet Generic drug: potassium chloride SA TAKE 1 TABLET BY MOUTH 3 TIMES DAILY.   metoprolol succinate 50 MG 24 hr tablet Commonly known as: TOPROL-XL TAKE 1 TABLET BY MOUTH EVERY DAY WITH OR IMMEDIATELY FOLLOWING A MEAL   olmesartan 40 MG tablet Commonly known as: BENICAR TAKE 1 TABLET BY MOUTH EVERY DAY   ONE TOUCH ULTRA TEST test strip Generic drug: glucose blood TEST 2 TIMES A DAY   Contour Next Test test strip Generic drug: glucose blood USE AS INSTRUCTED, TO TEST TWICE A DAY   pantoprazole 40 MG tablet Commonly known as: PROTONIX TAKE 1 TABLET BY MOUTH EVERY DAY   simvastatin 40 MG tablet Commonly known as: ZOCOR TAKE 1 TABLET BY MOUTH EVERYDAY AT BEDTIME   SYSTANE OP Place 1 drop into both eyes daily as needed (dry eyes).   vitamin A 10000 UNIT capsule Take 10,000 Units by mouth daily.               Durable Medical  Equipment  (From admission, onward)           Start     Ordered   09/26/22 0000  DME Bedside commode       Question:  Patient needs a bedside commode to treat with the following condition  Answer:  Lumbar stenosis   09/26/22 1049            Follow-up Information     Newman Pies, MD. Go on 10/12/2022.   Specialty: Neurosurgery Why: First post op appointment with x-rays is on 10/12/2022 at 1:15 PM. Contact information: [address] [address] [place]. Follow up.   Why: Latricia Heft)- HHPT arranged- they will contact you to schedule visits post discharge Contact information: [place] [address] [phone]  Marvetta Gibbons RNCM Follow up.   Why: contact if you get home and need a rolling walker Contact information: [phone]                Signed: Viona Gilmore, DNP, AGNP-C Nurse Practitioner  [facility] [place]. [address] [place], [place], [place] [address] P: [phone]    F: [phone]  09/26/2022, 10:49 AM

## 2022-09-26 NOTE — Progress Notes (Signed)
Subjective: The patient is alert and pleasant.  She wants to go home.  Her husband is at the bedside.  Objective: Vital signs in last 24 hours: Temp:  [98.2 F (36.8 C)-99.4 F (37.4 C)] 98.8 F (37.1 C) (02/28 0759) Pulse Rate:  [80-105] 80 (02/28 0759) Resp:  [14-20] 15 (02/28 0759) BP: (102-108)/(56-64) 102/62 (02/28 0759) SpO2:  [94 %-98 %] 95 % (02/28 0759) Estimated body mass index is 37.53 kg/m as calculated from the following:   Height as of this encounter: '5\' 3"'$  (1.6 m).   Weight as of this encounter: 96.1 kg.   Intake/Output from previous day: 02/27 0701 - 02/28 0700 In: 360 [P.O.:360] Out: -  Intake/Output this shift: No intake/output data recorded.  Physical exam the patient is alert and pleasant.  Her strength is normal.  Her lumbar wound is healing well.  There is no drainage.  Lab Results: No results for input(s): "WBC", "HGB", "HCT", "PLT" in the last 72 hours. BMET No results for input(s): "NA", "K", "CL", "CO2", "GLUCOSE", "BUN", "CREATININE", "CALCIUM" in the last 72 hours.  Studies/Results: No results found.  Assessment/Plan: Postop day #6: We will discharge to home with home PT.  I gave her her discharge instructions and answered all their questions.  LOS: 6 days     [name] 09/26/2022, 10:05 AM     Patient ID: Gabriella [name], female   DOB: [birth year], 68 y.o.   MRN: [id number]

## 2022-09-26 NOTE — TOC Transition Note (Signed)
Transition of Care (TOC) - CM/SW Discharge Note Marvetta Gibbons RN,BSN Transitions of Care Unit 4NP (Non Trauma)- RN Case Manager See Treatment Team for direct Phone #   Patient Details  Name: Gabriella [name] MRN: [id number] Date of Birth: June 14, 1956  Transition of Care [facility]) CM/SW Contact:  Dawayne Patricia, RN Phone Number: 09/26/2022, 12:23 PM   Clinical Narrative:    Pt stable for transition home today, spouse to transport home.  Order has been placed for HHPT, call made to Amy w/ [facility] for [facility] referral- referral has been accepted and they will contact pt to schedule.   CM spoke with pt over phone to update on [facility] arrangements pt voiced understanding. Also asked pt again about DME- pt voiced she is sure she as RW at home to use and wants to check first- CM provided contact info on AVS so pt can call if she does not have RW so that CM can assist in getting one.   No further TOC needs noted.    Final next level of care: Home w Home Health Services Barriers to Discharge: Barriers Resolved   Patient Goals and CMS Choice CMS Medicare.gov Compare Post Acute Care list provided to:: Patient Choice offered to / list presented to : Patient, Spouse  Discharge Placement                 Home w/ [facility]        Discharge Plan and Services Additional resources added to the After Visit Summary for     Discharge Planning Services: CM Consult Post Acute Care Choice: Home Health          DME Arranged: Walker rolling DME Agency: NA       HH Arranged: PT HH Agency: [place] Date [place]: 09/26/22 Time [place]: 1223 Representative spoke with at [place]: Amy  Social Determinants of Health ([place]) Interventions SDOH Screenings   Depression (PHQ2-9): Low Risk  (09/17/2022)  Tobacco Use: High Risk (09/20/2022)     Readmission Risk Interventions    09/26/2022   12:23 PM  Readmission Risk Prevention Plan  Post Dischage Appt Complete   Medication Screening Complete  Transportation Screening Complete

## 2022-09-26 NOTE — Discharge Instructions (Signed)
Wound Care Keep incision dry. You may remove the Honeycomb dressing at home. Do not put any creams, lotions, or ointments on incision. You are fine to shower. Let water run over incision and pat dry.  Activity Walk each and every day, increasing distance each day. No lifting greater than 5 lbs.  Avoid excessive back motion. No driving for 2 weeks; may ride as a passenger locally.  Diet Resume your normal diet.   Return to Work Will be discussed at your follow up appointment.  Call Your Doctor If Any of These Occur Redness, drainage, or swelling at the wound.  Temperature greater than 101 degrees. Severe pain not relieved by pain medication. Incision starts to come apart.  Follow Up Appt Call [phone] today for appointment in 3 weeks if you don't already have one or for any problems.  If you have any hardware placed in your spine, you will need an x-ray before your appointment.

## 2022-10-01 ENCOUNTER — Telehealth: Payer: Self-pay | Admitting: Internal Medicine

## 2022-10-01 NOTE — Telephone Encounter (Signed)
After talking with Dr Renold Genta she said the patient should continue medication as prescribed, she has been on this medicine for awhile with know complications.

## 2022-10-01 NOTE — Telephone Encounter (Signed)
Lenell Antu Physical Therapist - Latricia Heft  [phone]  Marzetta Board called to say when she was putting in patients medications it come up there was an interaction of a Level 1 between  amLODipine and simvastatin and she wanted to know if patient should continue taking medication as prescribed.

## 2022-10-04 ENCOUNTER — Inpatient Hospital Stay (HOSPITAL_COMMUNITY): Payer: Medicare Other | Admitting: Certified Registered Nurse Anesthetist

## 2022-10-04 ENCOUNTER — Other Ambulatory Visit: Payer: Self-pay | Admitting: Neurological Surgery

## 2022-10-04 ENCOUNTER — Encounter (HOSPITAL_COMMUNITY): Payer: Self-pay | Admitting: Neurological Surgery

## 2022-10-04 ENCOUNTER — Other Ambulatory Visit: Payer: Self-pay

## 2022-10-04 ENCOUNTER — Other Ambulatory Visit (HOSPITAL_COMMUNITY): Payer: Self-pay

## 2022-10-04 ENCOUNTER — Inpatient Hospital Stay (HOSPITAL_COMMUNITY)
Admission: AD | Admit: 2022-10-04 | Discharge: 2022-10-10 | DRG: 858 | Disposition: A | Discharge status: Home Health | Payer: Medicare Other | Source: Other Acute Inpatient Hospital | Attending: Neurosurgery | Admitting: Neurosurgery

## 2022-10-04 ENCOUNTER — Encounter (HOSPITAL_COMMUNITY): Admission: AD | Disposition: A | Discharge status: Home Health | Payer: Self-pay | Attending: Neurosurgery

## 2022-10-04 DIAGNOSIS — E119 Type 2 diabetes mellitus without complications: Secondary | ICD-10-CM | POA: Diagnosis present

## 2022-10-04 DIAGNOSIS — E669 Obesity, unspecified: Secondary | ICD-10-CM | POA: Diagnosis present

## 2022-10-04 DIAGNOSIS — Z8711 Personal history of peptic ulcer disease: Secondary | ICD-10-CM

## 2022-10-04 DIAGNOSIS — T8140XA Infection following a procedure, unspecified, initial encounter: Secondary | ICD-10-CM

## 2022-10-04 DIAGNOSIS — T8149XA Infection following a procedure, other surgical site, initial encounter: Principal | ICD-10-CM | POA: Diagnosis present

## 2022-10-04 DIAGNOSIS — I10 Essential (primary) hypertension: Secondary | ICD-10-CM

## 2022-10-04 DIAGNOSIS — Y838 Other surgical procedures as the cause of abnormal reaction of the patient, or of later complication, without mention of misadventure at the time of the procedure: Secondary | ICD-10-CM | POA: Diagnosis present

## 2022-10-04 DIAGNOSIS — Z6835 Body mass index (BMI) 35.0-35.9, adult: Secondary | ICD-10-CM

## 2022-10-04 DIAGNOSIS — Z8249 Family history of ischemic heart disease and other diseases of the circulatory system: Secondary | ICD-10-CM | POA: Diagnosis not present

## 2022-10-04 DIAGNOSIS — T8141XA Infection following a procedure, superficial incisional surgical site, initial encounter: Principal | ICD-10-CM | POA: Diagnosis present

## 2022-10-04 DIAGNOSIS — B9689 Other specified bacterial agents as the cause of diseases classified elsewhere: Secondary | ICD-10-CM | POA: Diagnosis present

## 2022-10-04 DIAGNOSIS — Z79899 Other long term (current) drug therapy: Secondary | ICD-10-CM

## 2022-10-04 DIAGNOSIS — Z8673 Personal history of transient ischemic attack (TIA), and cerebral infarction without residual deficits: Secondary | ICD-10-CM | POA: Diagnosis not present

## 2022-10-04 DIAGNOSIS — Z981 Arthrodesis status: Secondary | ICD-10-CM | POA: Diagnosis not present

## 2022-10-04 DIAGNOSIS — F17210 Nicotine dependence, cigarettes, uncomplicated: Secondary | ICD-10-CM | POA: Diagnosis present

## 2022-10-04 DIAGNOSIS — E781 Pure hyperglyceridemia: Secondary | ICD-10-CM | POA: Diagnosis present

## 2022-10-04 DIAGNOSIS — Z7982 Long term (current) use of aspirin: Secondary | ICD-10-CM

## 2022-10-04 HISTORY — PX: LUMBAR WOUND DEBRIDEMENT: SHX1988

## 2022-10-04 LAB — CBC WITH DIFFERENTIAL/PLATELET
Abs Immature Granulocytes: 0.04 10*3/uL (ref 0.00–0.07)
Basophils Absolute: 0 10*3/uL (ref 0.0–0.1)
Basophils Relative: 0 %
Eosinophils Absolute: 0 10*3/uL (ref 0.0–0.5)
Eosinophils Relative: 0 %
HCT: 32.9 % — ABNORMAL LOW (ref 36.0–46.0)
Hemoglobin: 10.7 g/dL — ABNORMAL LOW (ref 12.0–15.0)
Immature Granulocytes: 0 %
Lymphocytes Relative: 25 %
Lymphs Abs: 2.3 10*3/uL (ref 0.7–4.0)
MCH: 33 pg (ref 26.0–34.0)
MCHC: 32.5 g/dL (ref 30.0–36.0)
MCV: 101.5 fL — ABNORMAL HIGH (ref 80.0–100.0)
Monocytes Absolute: 0.9 10*3/uL (ref 0.1–1.0)
Monocytes Relative: 10 %
Neutro Abs: 5.9 10*3/uL (ref 1.7–7.7)
Neutrophils Relative %: 65 %
Platelets: 490 10*3/uL — ABNORMAL HIGH (ref 150–400)
RBC: 3.24 MIL/uL — ABNORMAL LOW (ref 3.87–5.11)
RDW: 13.3 % (ref 11.5–15.5)
WBC: 9.2 10*3/uL (ref 4.0–10.5)
nRBC: 0 % (ref 0.0–0.2)

## 2022-10-04 LAB — BASIC METABOLIC PANEL
Anion gap: 9 (ref 5–15)
BUN: 15 mg/dL (ref 8–23)
CO2: 28 mmol/L (ref 22–32)
Calcium: 8.7 mg/dL — ABNORMAL LOW (ref 8.9–10.3)
Chloride: 99 mmol/L (ref 98–111)
Creatinine, Ser: 0.75 mg/dL (ref 0.44–1.00)
GFR, Estimated: >60 mL/min (ref >60)
Glucose, Bld: 112 mg/dL — ABNORMAL HIGH (ref 70–99)
Potassium: 4.1 mmol/L (ref 3.5–5.1)
Sodium: 136 mmol/L (ref 135–145)

## 2022-10-04 LAB — CBC
HCT: 30 % — ABNORMAL LOW (ref 36.0–46.0)
Hemoglobin: 9.8 g/dL — ABNORMAL LOW (ref 12.0–15.0)
MCH: 33.2 pg (ref 26.0–34.0)
MCHC: 32.7 g/dL (ref 30.0–36.0)
MCV: 101.7 fL — ABNORMAL HIGH (ref 80.0–100.0)
Platelets: 508 10*3/uL — ABNORMAL HIGH (ref 150–400)
RBC: 2.95 MIL/uL — ABNORMAL LOW (ref 3.87–5.11)
RDW: 13.3 % (ref 11.5–15.5)
WBC: 8.7 10*3/uL (ref 4.0–10.5)
nRBC: 0 % (ref 0.0–0.2)

## 2022-10-04 LAB — C-REACTIVE PROTEIN: CRP: 8.3 mg/dL — ABNORMAL HIGH (ref <1.0)

## 2022-10-04 LAB — GLUCOSE, CAPILLARY
Glucose-Capillary: 101 mg/dL — ABNORMAL HIGH (ref 70–99)
Glucose-Capillary: 110 mg/dL — ABNORMAL HIGH (ref 70–99)

## 2022-10-04 LAB — SEDIMENTATION RATE: Sed Rate: 120 mm/hr — ABNORMAL HIGH (ref 0–22)

## 2022-10-04 LAB — CREATININE, SERUM
Creatinine, Ser: 0.77 mg/dL (ref 0.44–1.00)
GFR, Estimated: >60 mL/min (ref >60)

## 2022-10-04 SURGERY — LUMBAR WOUND DEBRIDEMENT
Anesthesia: General

## 2022-10-04 MED ORDER — PHENYLEPHRINE HCL-NACL 20-0.9 MG/250ML-% IV SOLN
INTRAVENOUS | Status: DC | PRN
  Administered 2022-10-04: 30 ug/min via INTRAVENOUS

## 2022-10-04 MED ORDER — CHLORHEXIDINE GLUCONATE 0.12 % MT SOLN: 15.0000 mL | OROMUCOSAL | Status: AC

## 2022-10-04 MED ORDER — ONDANSETRON HCL 4 MG PO TABS: 4.0000 mg | ORAL_TABLET | Freq: Four times a day (QID) | ORAL | Status: DC | PRN

## 2022-10-04 MED ORDER — SODIUM CHLORIDE 0.9 % IV SOLN
250.0000 mL | INTRAVENOUS | Status: DC
  Administered 2022-10-04 – 2022-10-09 (×2): 250 mL via INTRAVENOUS

## 2022-10-04 MED ORDER — VANCOMYCIN HCL 1000 MG IV SOLR: 1000.0000 mg | Freq: Once | INTRAVENOUS | Status: DC

## 2022-10-04 MED ORDER — METHOCARBAMOL 500 MG PO TABS
500.0000 mg | ORAL_TABLET | Freq: Four times a day (QID) | ORAL | Status: DC | PRN
  Administered 2022-10-04 – 2022-10-10 (×12): 500 mg via ORAL
  Filled 2022-10-04 (×12): qty 1

## 2022-10-04 MED ORDER — PHENYLEPHRINE 80 MCG/ML (10ML) SYRINGE FOR IV PUSH (FOR BLOOD PRESSURE SUPPORT)
PREFILLED_SYRINGE | INTRAVENOUS | Status: DC | PRN
  Administered 2022-10-04 (×2): 80 ug via INTRAVENOUS

## 2022-10-04 MED ORDER — VANCOMYCIN HCL 1750 MG/350ML IV SOLN
1750.0000 mg | Freq: Once | INTRAVENOUS | Status: AC
  Administered 2022-10-04: 1750 mg via INTRAVENOUS
  Filled 2022-10-04 (×2): qty 350

## 2022-10-04 MED ORDER — ONDANSETRON HCL 4 MG/2ML IJ SOLN
INTRAMUSCULAR | Status: AC
  Filled 2022-10-04: qty 2

## 2022-10-04 MED ORDER — ACETAMINOPHEN 325 MG PO TABS: 650.0000 mg | ORAL_TABLET | ORAL | Status: DC | PRN

## 2022-10-04 MED ORDER — 0.9 % SODIUM CHLORIDE (POUR BTL) OPTIME
TOPICAL | Status: DC | PRN
  Administered 2022-10-04: 1000 mL

## 2022-10-04 MED ORDER — HYDROMORPHONE HCL 1 MG/ML IJ SOLN
INTRAMUSCULAR | Status: AC
  Filled 2022-10-04: qty 0.5

## 2022-10-04 MED ORDER — SUGAMMADEX SODIUM 200 MG/2ML IV SOLN
INTRAVENOUS | Status: DC | PRN
  Administered 2022-10-04: 200 mg via INTRAVENOUS

## 2022-10-04 MED ORDER — MIDAZOLAM HCL 2 MG/2ML IJ SOLN: 0.5000 mg | Freq: Once | INTRAMUSCULAR | Status: DC | PRN

## 2022-10-04 MED ORDER — VANCOMYCIN HCL IN DEXTROSE 1-5 GM/200ML-% IV SOLN
1000.0000 mg | Freq: Once | INTRAVENOUS | Status: DC
  Filled 2022-10-04: qty 200

## 2022-10-04 MED ORDER — FENTANYL CITRATE (PF) 250 MCG/5ML IJ SOLN
INTRAMUSCULAR | Status: AC
  Filled 2022-10-04: qty 5

## 2022-10-04 MED ORDER — VANCOMYCIN HCL 1000 MG IV SOLR
INTRAVENOUS | Status: AC
  Filled 2022-10-04: qty 20

## 2022-10-04 MED ORDER — SODIUM CHLORIDE 0.9% FLUSH
3.0000 mL | Freq: Two times a day (BID) | INTRAVENOUS | Status: DC
  Administered 2022-10-04 – 2022-10-09 (×8): 3 mL via INTRAVENOUS

## 2022-10-04 MED ORDER — CHLORHEXIDINE GLUCONATE CLOTH 2 % EX PADS: 6.0000 | MEDICATED_PAD | Freq: Once | CUTANEOUS | Status: DC

## 2022-10-04 MED ORDER — OXYCODONE HCL 5 MG/5ML PO SOLN: 5.0000 mg | Freq: Once | ORAL | Status: DC | PRN

## 2022-10-04 MED ORDER — PHENYLEPHRINE 80 MCG/ML (10ML) SYRINGE FOR IV PUSH (FOR BLOOD PRESSURE SUPPORT)
PREFILLED_SYRINGE | INTRAVENOUS | Status: AC
  Filled 2022-10-04: qty 10

## 2022-10-04 MED ORDER — HYDROMORPHONE HCL 1 MG/ML IJ SOLN
INTRAMUSCULAR | Status: AC
  Filled 2022-10-04: qty 1

## 2022-10-04 MED ORDER — ACETAMINOPHEN 650 MG RE SUPP: 650.0000 mg | RECTAL | Status: DC | PRN

## 2022-10-04 MED ORDER — FLEET ENEMA 7-19 GM/118ML RE ENEM: 1.0000 | ENEMA | Freq: Once | RECTAL | Status: DC | PRN

## 2022-10-04 MED ORDER — ONDANSETRON HCL 4 MG/2ML IJ SOLN: 4.0000 mg | Freq: Four times a day (QID) | INTRAMUSCULAR | Status: DC | PRN

## 2022-10-04 MED ORDER — SENNA 8.6 MG PO TABS
1.0000 | ORAL_TABLET | Freq: Two times a day (BID) | ORAL | Status: DC
  Administered 2022-10-04 – 2022-10-09 (×8): 8.6 mg via ORAL
  Filled 2022-10-04 (×10): qty 1

## 2022-10-04 MED ORDER — PROPOFOL 10 MG/ML IV BOLUS
INTRAVENOUS | Status: DC | PRN
  Administered 2022-10-04: 100 mg via INTRAVENOUS

## 2022-10-04 MED ORDER — LIDOCAINE 2% (20 MG/ML) 5 ML SYRINGE
INTRAMUSCULAR | Status: AC
  Filled 2022-10-04: qty 5

## 2022-10-04 MED ORDER — OXYCODONE-ACETAMINOPHEN 5-325 MG PO TABS
1.0000 | ORAL_TABLET | Freq: Four times a day (QID) | ORAL | Status: DC | PRN
  Administered 2022-10-04: 1 via ORAL
  Administered 2022-10-05 – 2022-10-10 (×19): 2 via ORAL
  Filled 2022-10-04 (×5): qty 2
  Filled 2022-10-04: qty 1
  Filled 2022-10-04 (×12): qty 2
  Filled 2022-10-04: qty 1
  Filled 2022-10-04: qty 2
  Filled 2022-10-04: qty 1

## 2022-10-04 MED ORDER — FENTANYL CITRATE (PF) 250 MCG/5ML IJ SOLN
INTRAMUSCULAR | Status: DC | PRN
  Administered 2022-10-04: 100 ug via INTRAVENOUS
  Administered 2022-10-04 (×3): 50 ug via INTRAVENOUS

## 2022-10-04 MED ORDER — PROMETHAZINE HCL 25 MG/ML IJ SOLN: 6.2500 mg | INTRAMUSCULAR | Status: DC | PRN

## 2022-10-04 MED ORDER — SODIUM CHLORIDE 0.9 % IV SOLN
2.0000 g | Freq: Once | INTRAVENOUS | Status: AC
  Administered 2022-10-04: 2 g via INTRAVENOUS
  Filled 2022-10-04: qty 20

## 2022-10-04 MED ORDER — HYDROMORPHONE HCL 1 MG/ML IJ SOLN
INTRAMUSCULAR | Status: DC | PRN
  Administered 2022-10-04: .5 mg via INTRAVENOUS

## 2022-10-04 MED ORDER — OXYCODONE HCL 5 MG PO TABS: 5.0000 mg | ORAL_TABLET | Freq: Once | ORAL | Status: DC | PRN

## 2022-10-04 MED ORDER — HYDROMORPHONE HCL 4 MG PO TABS
2.0000 mg | ORAL_TABLET | ORAL | 0 refills | Status: DC | PRN
  Filled 2022-10-04: qty 21, 7d supply, fill #0

## 2022-10-04 MED ORDER — MENTHOL 3 MG MT LOZG: 1.0000 | LOZENGE | OROMUCOSAL | Status: DC | PRN

## 2022-10-04 MED ORDER — ACETAMINOPHEN 500 MG PO TABS: 1000.0000 mg | ORAL_TABLET | Freq: Once | ORAL | Status: AC

## 2022-10-04 MED ORDER — MIDAZOLAM HCL 2 MG/2ML IJ SOLN
INTRAMUSCULAR | Status: AC
  Filled 2022-10-04: qty 2

## 2022-10-04 MED ORDER — ROCURONIUM BROMIDE 10 MG/ML (PF) SYRINGE
PREFILLED_SYRINGE | INTRAVENOUS | Status: AC
  Filled 2022-10-04: qty 10

## 2022-10-04 MED ORDER — VANCOMYCIN HCL 1250 MG/250ML IV SOLN
1250.0000 mg | INTRAVENOUS | Status: DC
  Filled 2022-10-04: qty 250

## 2022-10-04 MED ORDER — BISACODYL 10 MG RE SUPP
10.0000 mg | Freq: Every day | RECTAL | Status: DC | PRN
  Administered 2022-10-05: 10 mg via RECTAL
  Filled 2022-10-04: qty 1

## 2022-10-04 MED ORDER — HYDROCODONE-ACETAMINOPHEN 10-325 MG PO TABS: ORAL_TABLET | ORAL | 0 refills | Status: DC

## 2022-10-04 MED ORDER — THROMBIN 5000 UNITS EX SOLR
CUTANEOUS | Status: AC
  Filled 2022-10-04: qty 5000

## 2022-10-04 MED ORDER — METHOCARBAMOL 1000 MG/10ML IJ SOLN: 500.0000 mg | Freq: Four times a day (QID) | INTRAVENOUS | Status: DC | PRN

## 2022-10-04 MED ORDER — ROCURONIUM BROMIDE 10 MG/ML (PF) SYRINGE
PREFILLED_SYRINGE | INTRAVENOUS | Status: DC | PRN
  Administered 2022-10-04: 10 mg via INTRAVENOUS
  Administered 2022-10-04: 60 mg via INTRAVENOUS

## 2022-10-04 MED ORDER — ENOXAPARIN SODIUM 40 MG/0.4ML IJ SOSY
40.0000 mg | PREFILLED_SYRINGE | INTRAMUSCULAR | Status: DC
  Administered 2022-10-05 – 2022-10-10 (×6): 40 mg via SUBCUTANEOUS
  Filled 2022-10-04 (×6): qty 0.4

## 2022-10-04 MED ORDER — LIDOCAINE 2% (20 MG/ML) 5 ML SYRINGE
INTRAMUSCULAR | Status: DC | PRN
  Administered 2022-10-04: 20 mg via INTRAVENOUS

## 2022-10-04 MED ORDER — PROPOFOL 10 MG/ML IV BOLUS
INTRAVENOUS | Status: AC
  Filled 2022-10-04: qty 20

## 2022-10-04 MED ORDER — POLYETHYLENE GLYCOL 3350 17 G PO PACK: 17.0000 g | PACK | Freq: Every day | ORAL | Status: DC | PRN

## 2022-10-04 MED ORDER — INSULIN ASPART 100 UNIT/ML IJ SOLN: 0.0000 [IU] | INTRAMUSCULAR | Status: DC | PRN

## 2022-10-04 MED ORDER — HYDROMORPHONE HCL 1 MG/ML IJ SOLN
0.2500 mg | INTRAMUSCULAR | Status: DC | PRN
  Administered 2022-10-04 (×4): 0.5 mg via INTRAVENOUS

## 2022-10-04 MED ORDER — LACTATED RINGERS IV SOLN: INTRAVENOUS | Status: DC

## 2022-10-04 MED ORDER — BACITRACIN ZINC 500 UNIT/GM EX OINT
TOPICAL_OINTMENT | CUTANEOUS | Status: AC
  Filled 2022-10-04: qty 28.35

## 2022-10-04 MED ORDER — CHLORHEXIDINE GLUCONATE 0.12 % MT SOLN
OROMUCOSAL | Status: AC
  Administered 2022-10-04: 15 mL via OROMUCOSAL
  Filled 2022-10-04: qty 15

## 2022-10-04 MED ORDER — DOCUSATE SODIUM 100 MG PO CAPS
100.0000 mg | ORAL_CAPSULE | Freq: Two times a day (BID) | ORAL | Status: DC
  Administered 2022-10-04 – 2022-10-09 (×9): 100 mg via ORAL
  Filled 2022-10-04 (×12): qty 1

## 2022-10-04 MED ORDER — MORPHINE SULFATE (PF) 2 MG/ML IV SOLN
2.0000 mg | INTRAVENOUS | Status: DC | PRN
  Administered 2022-10-05 – 2022-10-09 (×9): 2 mg via INTRAVENOUS
  Filled 2022-10-04 (×10): qty 1

## 2022-10-04 MED ORDER — SODIUM CHLORIDE 0.9% FLUSH
3.0000 mL | INTRAVENOUS | Status: DC | PRN
  Administered 2022-10-05 (×3): 3 mL via INTRAVENOUS

## 2022-10-04 MED ORDER — PHENOL 1.4 % MT LIQD: 1.0000 | OROMUCOSAL | Status: DC | PRN

## 2022-10-04 MED ORDER — MEPERIDINE HCL 25 MG/ML IJ SOLN: 6.2500 mg | INTRAMUSCULAR | Status: DC | PRN

## 2022-10-04 MED ORDER — SODIUM CHLORIDE 0.9 % IV SOLN
2.0000 g | Freq: Two times a day (BID) | INTRAVENOUS | Status: DC
  Administered 2022-10-04 – 2022-10-05 (×2): 2 g via INTRAVENOUS
  Filled 2022-10-04 (×2): qty 20

## 2022-10-04 MED ORDER — ACETAMINOPHEN 500 MG PO TABS
ORAL_TABLET | ORAL | Status: AC
  Administered 2022-10-04: 1000 mg via ORAL
  Filled 2022-10-04: qty 2

## 2022-10-04 MED ORDER — MIDAZOLAM HCL 2 MG/2ML IJ SOLN
INTRAMUSCULAR | Status: DC | PRN
  Administered 2022-10-04: 2 mg via INTRAVENOUS

## 2022-10-04 MED ORDER — CEFAZOLIN SODIUM-DEXTROSE 2-4 GM/100ML-% IV SOLN
2.0000 g | INTRAVENOUS | Status: DC
  Filled 2022-10-04: qty 100

## 2022-10-04 MED ORDER — DEXTROSE 5 % IV SOLN: 2.0000 g | Freq: Once | INTRAVENOUS | Status: DC

## 2022-10-04 MED ORDER — ONDANSETRON HCL 4 MG/2ML IJ SOLN
INTRAMUSCULAR | Status: DC | PRN
  Administered 2022-10-04: 4 mg via INTRAVENOUS

## 2022-10-04 SURGICAL SUPPLY — 47 items
APL SKNCLS STERI-STRIP NONHPOA (GAUZE/BANDAGES/DRESSINGS)
BAG COUNTER SPONGE SURGICOUNT (BAG) ×1 IMPLANT
BAG SPNG CNTER NS LX DISP (BAG) ×1
BENZOIN TINCTURE PRP APPL 2/3 (GAUZE/BANDAGES/DRESSINGS) IMPLANT
BLADE CLIPPER SURG (BLADE) IMPLANT
CABLE BIPOLOR RESECTION CORD (MISCELLANEOUS) ×1 IMPLANT
CANISTER SUCT 3000ML PPV (MISCELLANEOUS) ×1 IMPLANT
DRAPE LAPAROTOMY 100X72X124 (DRAPES) ×1 IMPLANT
DURAPREP 26ML APPLICATOR (WOUND CARE) ×1 IMPLANT
ELECT REM PT RETURN 9FT ADLT (ELECTROSURGICAL) ×1
ELECTRODE REM PT RTRN 9FT ADLT (ELECTROSURGICAL) ×1 IMPLANT
EVACUATOR 1/8 PVC DRAIN (DRAIN) IMPLANT
GAUZE 4X4 16PLY ~~LOC~~+RFID DBL (SPONGE) IMPLANT
GAUZE SPONGE 4X4 12PLY STRL (GAUZE/BANDAGES/DRESSINGS) ×1 IMPLANT
GAUZE SPONGE 4X4 12PLY STRL LF (GAUZE/BANDAGES/DRESSINGS) IMPLANT
GLOVE BIOGEL PI IND STRL 8.5 (GLOVE) ×1 IMPLANT
GLOVE ECLIPSE 8.5 STRL (GLOVE) ×1 IMPLANT
GLOVE EXAM NITRILE XL STR (GLOVE) IMPLANT
GOWN STRL REUS W/ TWL LRG LVL3 (GOWN DISPOSABLE) ×1 IMPLANT
GOWN STRL REUS W/ TWL XL LVL3 (GOWN DISPOSABLE) ×1 IMPLANT
GOWN STRL REUS W/TWL 2XL LVL3 (GOWN DISPOSABLE) ×1 IMPLANT
GOWN STRL REUS W/TWL LRG LVL3 (GOWN DISPOSABLE) ×1
GOWN STRL REUS W/TWL XL LVL3 (GOWN DISPOSABLE) ×1
KIT BASIN OR (CUSTOM PROCEDURE TRAY) ×1 IMPLANT
KIT TURNOVER KIT B (KITS) ×1 IMPLANT
MARKER SKIN DUAL TIP RULER LAB (MISCELLANEOUS) ×1 IMPLANT
NEEDLE HYPO 22GX1.5 SAFETY (NEEDLE) ×1 IMPLANT
NS IRRIG 1000ML POUR BTL (IV SOLUTION) ×1 IMPLANT
PACK LAMINECTOMY NEURO (CUSTOM PROCEDURE TRAY) ×1 IMPLANT
PAD ARMBOARD 7.5X6 YLW CONV (MISCELLANEOUS) ×1 IMPLANT
SPIKE FLUID TRANSFER (MISCELLANEOUS) ×1 IMPLANT
SPONGE SURGIFOAM ABS GEL SZ50 (HEMOSTASIS) ×1 IMPLANT
SPONGE T-LAP 4X18 ~~LOC~~+RFID (SPONGE) IMPLANT
STAPLER SKIN PROX WIDE 3.9 (STAPLE) IMPLANT
STRIP CLOSURE SKIN 1/2X4 (GAUZE/BANDAGES/DRESSINGS) IMPLANT
SUT VIC AB 0 CT1 18XCR BRD8 (SUTURE) ×1 IMPLANT
SUT VIC AB 0 CT1 8-18 (SUTURE) ×1
SUT VIC AB 2-0 CP2 18 (SUTURE) ×1 IMPLANT
SUT VIC AB 3-0 SH 8-18 (SUTURE) ×1 IMPLANT
SUT VIC AB 4-0 RB1 18 (SUTURE) ×1 IMPLANT
SWAB COLLECTION DEVICE MRSA (MISCELLANEOUS) IMPLANT
SWAB CULTURE ESWAB REG 1ML (MISCELLANEOUS) IMPLANT
SYR CONTROL 10ML LL (SYRINGE) ×1 IMPLANT
TAPE CLOTH SURG 4X10 WHT LF (GAUZE/BANDAGES/DRESSINGS) IMPLANT
TOWEL GREEN STERILE (TOWEL DISPOSABLE) ×1 IMPLANT
TOWEL GREEN STERILE FF (TOWEL DISPOSABLE) ×1 IMPLANT
WATER STERILE IRR 1000ML POUR (IV SOLUTION) ×1 IMPLANT

## 2022-10-04 NOTE — Transfer of Care (Signed)
Immediate Anesthesia Transfer of Care Note  Patient: Gabriella [name]  Procedure(s) Performed: LUMBAR WOUND DEBRIDEMENT  Patient Location: PACU  Anesthesia Type:General  Level of Consciousness: awake, alert , and oriented  Airway & Oxygen Therapy: Patient connected to face mask oxygen  Post-op Assessment: Report given to RN and Post -op Vital signs reviewed and stable  Post vital signs: Reviewed and stable  Last Vitals:  Vitals Value Taken Time  BP 142/130 10/04/22 1623  Temp    Pulse 72 10/04/22 1626  Resp 15 10/04/22 1626  SpO2 100 % 10/04/22 1626  Vitals shown include unvalidated device data.  Last Pain:  Vitals:   10/04/22 1405  TempSrc: Oral  PainSc: 8       Patients Stated Pain Goal: 5 ([id number])  Complications: There were no known notable events for this encounter.

## 2022-10-04 NOTE — H&P (Signed)
Gabriella [name] is an 67 y.o. female.   Chief Complaint: Wound infection status post decompression and fusion L2-L5 HPI: Gabriella [name] is a 67 year old individual who underwent surgical decompression and arthrodesis from L2-L5 on February 22.  She was seen several days ago and it was noted that she had some redness in the wound and she was started on some oral antibiotics.  Patient subsequently started draining significant pus and has had an increase in centralized back pain with pain radiating into the hips.  She was seen in the office today and advised that she would need surgical debridement of her lumbar wound.    Past Medical History:  Diagnosis Date   Arthritis    Cancer ([place])    FACIAL CANCER    Dependent edema    Diabetes mellitus    pt reports she was pre-DM in the past but lost weight and is no longer DM   Headache    Hypertension    Hypertriglyceridemia    Hypokalemia    Metrorrhagia    Obesity    Peptic ulcer disease    PONV (postoperative nausea and vomiting)    SUI (stress urinary incontinence, female)    TIA (transient ischemic attack)    "I might've had a TIA in summer 2022"   Tobacco abuse     Past Surgical History:  Procedure Laterality Date   ANTERIOR CRUCIATE LIGAMENT REPAIR     BILATERAL  92+95   arthroscopic knee surgery  05/2003   right knee   CYST EXCISION     OVARY      NECK SURGERY  07/05/2021   perforated duodenal ulcer  10/2006   RADIOACTIVE SEED IMPLANT     fluid drained from right breast - pt denies having radioactive seed placed   REVERSE SHOULDER ARTHROPLASTY Right 07/05/2016   Procedure: REVERSE SHOULDER ARTHROPLASTY;  Surgeon: Justice Britain, MD;  Location: [facility];  Service: Orthopedics;  Laterality: Right;   TUBAL LIGATION     67 yo    Family History  Problem Relation Age of Onset   Hypertension Mother    Dementia Mother    Lung cancer Father    Breast cancer Neg Hx    Social History:  reports that she has been smoking cigarettes.  She has been smoking an average of 1 pack per day. She has never used smokeless tobacco. She reports current drug use. Drug: Marijuana. She reports that she does not drink alcohol.  Allergies: No Known Allergies  Medications Prior to Admission  Medication Sig Dispense Refill   allopurinol (ZYLOPRIM) 300 MG tablet TAKE 1 TABLET BY MOUTH EVERY DAY 90 tablet 3   amLODipine (NORVASC) 10 MG tablet TAKE 1 TABLET BY MOUTH EVERY DAY 90 tablet 3   aspirin EC 81 MG tablet Take 81 mg by mouth daily.     Cholecalciferol 100 MCG (4000 UT) CAPS Take 4,000 Units by mouth daily.     CONTOUR NEXT TEST test strip USE AS INSTRUCTED, TO TEST TWICE A DAY 100 strip 12   cyclobenzaprine (FLEXERIL) 5 MG tablet Take 5 mg by mouth 3 (three) times daily as needed for muscle spasms.     diazepam (VALIUM) 5 MG tablet TAKE 1 TABLET (5 MG TOTAL) BY MOUTH EVERY 12 (TWELVE) HOURS AS NEEDED. FOR ANXIETY (Patient taking differently: Take 5 mg by mouth daily.) 60 tablet 2   diclofenac Sodium (VOLTAREN) 1 % GEL Apply 1 Application topically 4 (four) times daily as needed (pain).  furosemide (LASIX) 40 MG tablet TAKE 1 TABLET BY MOUTH EVERY DAY 90 tablet 3   gabapentin (NEURONTIN) 400 MG capsule Take 800 mg by mouth See admin instructions. Take 800 mg daily, may take a second 800 mg dose as needed for pain     HYDROcodone-acetaminophen (NORCO) 10-325 MG tablet Take 1 tablet by mouth every 4 (four) hours as needed for pain. Stop MSIR. 180 tablet 0   HYDROcodone-acetaminophen (NORCO) 10-325 MG tablet Take 1 tablet by mouth every 4 hours as needed for pain--- stop MSIR 180 tablet 0   HYDROmorphone (DILAUDID) 2 MG tablet Take 1 tablet (2 mg total) by mouth every 4 (four) hours as needed for pain. Take with Hydrocodone/Apap for post op pain control. 42 tablet 0   HYDROmorphone (DILAUDID) 4 MG tablet Take 1/2 tablet (2 mg) by mouth every 4 hours as needed for post op pain control. 21 tablet 0   KLOR-CON M20 20 MEQ tablet TAKE 1 TABLET  BY MOUTH 3 TIMES DAILY. 270 tablet 3   metoprolol succinate (TOPROL-XL) 50 MG 24 hr tablet TAKE 1 TABLET BY MOUTH EVERY DAY WITH OR IMMEDIATELY FOLLOWING A MEAL 90 tablet 3   olmesartan (BENICAR) 40 MG tablet TAKE 1 TABLET BY MOUTH EVERY DAY 90 tablet 1   Omega-3 Fatty Acids (FISH OIL) 1000 MG CAPS Take 2,000 mg by mouth daily.     ONE TOUCH ULTRA TEST test strip TEST 2 TIMES A DAY 300 each 3   pantoprazole (PROTONIX) 40 MG tablet TAKE 1 TABLET BY MOUTH EVERY DAY 90 tablet 3   Polyethyl Glycol-Propyl Glycol (SYSTANE OP) Place 1 drop into both eyes daily as needed (dry eyes).     simvastatin (ZOCOR) 40 MG tablet TAKE 1 TABLET BY MOUTH EVERYDAY AT BEDTIME 90 tablet 3   vitamin A 10000 UNIT capsule Take 10,000 Units by mouth daily.      Results for orders placed or performed during the hospital encounter of 10/04/22 (from the past 48 hour(s))  Glucose, capillary     Status: Abnormal   Collection Time: 10/04/22  2:13 PM  Result Value Ref Range   Glucose-Capillary 101 (H) 70 - 99 mg/dL    Comment: Glucose reference range applies only to samples taken after fasting for at least 8 hours.   Comment 1 Notify RN    Comment 2 Document in Chart    No results found.  Review of Systems  Constitutional:  Positive for activity change.  Musculoskeletal:  Positive for back pain.  Neurological: Negative.   All other systems reviewed and are negative.   Blood pressure 134/69, pulse 78, temperature 98.2 F (36.8 C), temperature source Oral, resp. rate 16, height '5\' 5"'$  (1.651 m), weight 96.2 kg. Physical Exam Constitutional:      Appearance: She is obese.  HENT:     Head: Normocephalic and atraumatic.     Right Ear: Tympanic membrane, ear canal and external ear normal.     Left Ear: Tympanic membrane, ear canal and external ear normal.     Nose: Nose normal.     Mouth/Throat:     Mouth: Mucous membranes are moist.     Pharynx: Oropharynx is clear.  Eyes:     Extraocular Movements: Extraocular  movements intact.     Conjunctiva/sclera: Conjunctivae normal.     Pupils: Pupils are equal, round, and reactive to light.  Cardiovascular:     Rate and Rhythm: Normal rate and regular rhythm.     Pulses: Normal  pulses.  Pulmonary:     Effort: Pulmonary effort is normal.  Abdominal:     General: Abdomen is flat. Bowel sounds are normal.     Palpations: Abdomen is soft.  Musculoskeletal:     Cervical back: Normal range of motion.     Comments: Positive straight leg raising in either lower extremity Patrick's maneuver is negative. Lamination of the wound reveals deep red and induration in the midportion of the incision measuring approximately 3 cm in length there is induration measures 3 cm in width there is frank pus draining from the center portion of the incision.  Skin:    General: Skin is warm and dry.     Capillary Refill: Capillary refill takes less than 2 seconds.  Neurological:     General: No focal deficit present.     Mental Status: She is alert.  Psychiatric:        Mood and Affect: Mood normal.        Behavior: Behavior normal.        Thought Content: Thought content normal.        Judgment: Judgment normal.      Assessment/Plan Superficial lumbar wound infection L2-L5.  Plan surgical debridement and exploration of lumbar wound.  Earleen Newport, MD 10/04/2022, 2:14 PM

## 2022-10-04 NOTE — Anesthesia Procedure Notes (Signed)
Procedure Name: Intubation Date/Time: 10/04/2022 3:00 PM  Performed by: Ester Rink, CRNAPre-anesthesia Checklist: Patient identified, Emergency Drugs available, Suction available, Patient being monitored and Timeout performed Patient Re-evaluated:Patient Re-evaluated prior to induction Oxygen Delivery Method: Circle system utilized Preoxygenation: Pre-oxygenation with 100% oxygen Induction Type: IV induction Ventilation: Mask ventilation without difficulty Laryngoscope Size: Mac and 4 Grade View: Grade I Tube size: 7.0 mm Number of attempts: 1 Placement Confirmation: ETT inserted through vocal cords under direct vision, positive ETCO2 and breath sounds checked- equal and bilateral Secured at: 21 cm Tube secured with: Tape Dental Injury: Teeth and Oropharynx as per pre-operative assessment  Comments: Performed With Lance Bosch, RN

## 2022-10-04 NOTE — Anesthesia Postprocedure Evaluation (Signed)
Anesthesia Post Note  Patient: Gabriella [name]  Procedure(s) Performed: [place]     Patient location during evaluation: PACU Anesthesia Type: General Level of consciousness: awake and alert, patient cooperative and oriented Pain management: pain level controlled Vital Signs Assessment: post-procedure vital signs reviewed and stable Respiratory status: spontaneous breathing, nonlabored ventilation, respiratory function stable and patient connected to nasal cannula oxygen Cardiovascular status: blood pressure returned to baseline and stable Postop Assessment: no apparent nausea or vomiting Anesthetic complications: no   There were no known notable events for this encounter.  Last Vitals:  Vitals:   10/04/22 1700 10/04/22 1715  BP: 111/62 (!) 107/58  Pulse: 70 73  Resp: 13 13  Temp:    SpO2: 95% 99%    Last Pain:  Vitals:   10/04/22 1700  TempSrc:   PainSc: Asleep                 Raneem Mendolia,E. Cherl Gorney

## 2022-10-04 NOTE — Op Note (Signed)
Date of surgery: 10/04/2022 Preoperative diagnosis: Superficial wound infection status post surgical decompression L2-L5 on 09/20/2022 Postoperative diagnosis: Same Procedure: Debridement of the superficial wound infection lumbar spine Surgeon: Kristeen Miss Anesthesia: General endotracheal Indications: Gabriella [name] is a 67 year old individual who underwent surgical decompression and stabilization from L2-L5 on February 22 of this year by Dr. Earle Gell.  She had some redness in the incision noted about a week ago and was started on some oral antibiotics.  Today she presented to the office with a wound draining.  She was advised regarding the need for debridement.  Procedure: Patient was brought to the operating room supine on the stretcher.  After the smooth induction of general tracheal anesthesia she was carefully turned prone.  The wound had serosanguineous drainage in the superficial tissues and the wound was then prepped with alcohol DuraPrep and draped in a sterile fashion.  Incision which was already opened in the midportion for a distance of about 3 cm was opened further using scissors to cut the superficial stitches.  The wound was opened down to the fascia however the fascia itself was noted to be closed after initial debridement of the fluid cultures were taken from the tissues.  Palpation all around the wound yielded no pus that was able to be extracted from the area below the fascia.  The fascia was then debrided sharply and the skin edges were debrided sharply with a #10 blade removing a large portion of tissue from the circumference of the entirety of the incision down to a depth of approximately 3 cm.  The superficial fat was noted to be involved with some necrosis and this was all debrided once all the debrided and necrotic tissue was removed the area was copiously irrigated it bled profusely during this time and the cautery was used minimally to help [place] stop the bleeding and key spots.  Once  adequate hemostasis could be achieved the wound was closed over a large Hemovac drain using #0 Vicryl sutures in interrupted fashion in the deep tissues 2-0 Vicryl in the subcu cutaneous tissues and surgical staples in the skin.  Blood loss for the procedure was estimated 200 cc.  The tissue was sent to the laboratory for pathology.  A dry sterile gauze over bacitracin ointment was placed on the wound.  Patient was returned to recovery room.

## 2022-10-04 NOTE — Anesthesia Preprocedure Evaluation (Addendum)
Anesthesia Evaluation  Patient identified by MRN, date of birth, ID band Patient awake    Reviewed: Allergy & Precautions, NPO status , Patient's Chart, lab work & pertinent test results  History of Anesthesia Complications (+) PONV  Airway Mallampati: II  TM Distance: >3 FB Neck ROM: Full    Dental  (+) Dental Advisory Given   Pulmonary Current SmokerPatient did not abstain from smoking.   breath sounds clear to auscultation       Cardiovascular hypertension, Pt. on medications and Pt. on home beta blockers (-) angina  Rhythm:Regular Rate:Normal  '22 ECHO: EF 60-65%. The LV has normal function. The left ventricle has no regional  wall motion abnormalities. Left ventricular diastolic parameters are  indeterminate.   2. Right ventricular systolic function is normal. The right ventricular  size is normal. Tricuspid regurgitation signal is inadequate for assessing  PA pressure.   3. The mitral valve is normal in structure. No evidence of mitral valve  regurgitation.   4. The aortic valve was not well visualized. Aortic valve regurgitation  is not visualized. No aortic stenosis is present.     Neuro/Psych  Headaches TIA   GI/Hepatic PUD,,,(+)     substance abuse (narcotic pain use)    Endo/Other  diabetes    Renal/GU negative Renal ROS     Musculoskeletal  (+) Arthritis ,    Abdominal   Peds  Hematology  (+) Blood dyscrasia, anemia   Anesthesia Other Findings WOUND INFECTION, LUMBAR REGION  Reproductive/Obstetrics                             Anesthesia Physical Anesthesia Plan  ASA: 3  Anesthesia Plan: General   Post-op Pain Management: Tylenol PO (pre-op)*   Induction: Intravenous  PONV Risk Score and Plan: 3 and Ondansetron, Dexamethasone and Treatment may vary due to age or medical condition  Airway Management Planned: Oral ETT  Additional Equipment: None  Intra-op  Plan:   Post-operative Plan: Extubation in OR  Informed Consent: I have reviewed the patients History and Physical, chart, labs and discussed the procedure including the risks, benefits and alternatives for the proposed anesthesia with the patient or authorized representative who has indicated his/her understanding and acceptance.     Dental advisory given  Plan Discussed with: CRNA and Surgeon  Anesthesia Plan Comments:        Anesthesia Quick Evaluation

## 2022-10-04 NOTE — Progress Notes (Signed)
Pharmacy Antibiotic Note  Gabriella [name] is a 67 y.o. female admitted on 10/04/2022 with  spinal wound infection .  Pharmacy has been consulted for vanc dosing.  Pt with recent decompression and fusion on 2/24. She developed draining wound. S/p I&D today with cultures sent.  Scr 0.75  Plan: Vanc 1.75g IV x1 then 1.25g IV q24>>AUC 458, scr 0.8 Levels if needed  Height: '5\' 5"'$  (165.1 cm) Weight: 96.2 kg (212 lb) IBW/kg (Calculated) : 57  Temp (24hrs), Avg:98.1 F (36.7 C), Min:97.9 F (36.6 C), Max:98.2 F (36.8 C)  Recent Labs  Lab 10/04/22 1450  WBC 9.2  CREATININE 0.75    Estimated Creatinine Clearance: 80.5 mL/min (by C-G formula based on SCr of 0.75 mg/dL).    No Known Allergies  Antimicrobials this admission: 3/7 vanc>> 3/7 ceftriaxone>>  Dose adjustments this admission:   Microbiology results: 3/7 blood>> 3/7 wound>>  Onnie Boer, PharmD, [place], [place], CPP Infectious Disease Pharmacist 10/04/2022 7:44 PM

## 2022-10-05 ENCOUNTER — Encounter (HOSPITAL_COMMUNITY): Payer: Self-pay | Admitting: Neurological Surgery

## 2022-10-05 ENCOUNTER — Other Ambulatory Visit: Payer: Self-pay

## 2022-10-05 DIAGNOSIS — T8149XA Infection following a procedure, other surgical site, initial encounter: Secondary | ICD-10-CM | POA: Diagnosis not present

## 2022-10-05 LAB — GLUCOSE, CAPILLARY
Glucose-Capillary: 163 mg/dL — ABNORMAL HIGH (ref 70–99)
Glucose-Capillary: 127 mg/dL — ABNORMAL HIGH (ref 70–99)
Glucose-Capillary: 142 mg/dL — ABNORMAL HIGH (ref 70–99)
Glucose-Capillary: 132 mg/dL — ABNORMAL HIGH (ref 70–99)
Glucose-Capillary: 138 mg/dL — ABNORMAL HIGH (ref 70–99)

## 2022-10-05 MED ORDER — SODIUM CHLORIDE 0.9 % IV SOLN
2.0000 g | Freq: Three times a day (TID) | INTRAVENOUS | Status: AC
  Administered 2022-10-05 – 2022-10-09 (×14): 2 g via INTRAVENOUS
  Filled 2022-10-05 (×15): qty 12.5

## 2022-10-05 MED ORDER — KETOROLAC TROMETHAMINE 15 MG/ML IJ SOLN
7.5000 mg | Freq: Four times a day (QID) | INTRAMUSCULAR | Status: AC
  Administered 2022-10-05 – 2022-10-07 (×7): 7.5 mg via INTRAVENOUS
  Filled 2022-10-05 (×7): qty 1

## 2022-10-05 MED FILL — Sodium Chloride IV Soln 0.9%: INTRAVENOUS | Qty: 1000 | Status: AC

## 2022-10-05 MED FILL — Heparin Sodium (Porcine) Inj 1000 Unit/ML: INTRAMUSCULAR | Qty: 30 | Status: AC

## 2022-10-05 NOTE — Evaluation (Signed)
Occupational Therapy Evaluation Patient Details Name: Gabriella [name] MRN: [id number] DOB: 05/04/1956 Today's Date: 10/05/2022   History of Present Illness Patient is a 67 yo female presenting to the hospital on 10/04/22 for debridement of wound infection status post surgical decompression L2-L5 on 09/20/2022.  PMH includes OA, cancer, DM, HTN, TIA.   Clinical Impression   Prior to this admission, patient was requiring assist from family in order to complete lower body dressing (typically only wearing dresses for ease and independence) and walking with a rollator. Currently patient presenting with significant pain (8/10) at incision site, and need for min A of 2 for transfers, and mod A for ADLs. Patient with noted STM deficits as well as slowed processing speed and slowed participation in all ADLs and functional mobility. OT recommending HHOT at discharge; OT will continue to follow.      Recommendations for follow up therapy are one component of a multi-disciplinary discharge planning process, led by the attending physician.  Recommendations may be updated based on patient status, additional functional criteria and insurance authorization.   Follow Up Recommendations  Home health OT     Assistance Recommended at Discharge Intermittent Supervision/Assistance  Patient can return home with the following A little help with walking and/or transfers;A lot of help with bathing/dressing/bathroom;Assistance with cooking/housework;Direct supervision/assist for financial management;Direct supervision/assist for medications management;Assist for transportation;Help with stairs or ramp for entrance    Functional Status Assessment  Patient has had a recent decline in their functional status and demonstrates the ability to make significant improvements in function in a reasonable and predictable amount of time.  Equipment Recommendations  None recommended by OT (Patient has DME needed)     Recommendations for Other Services       Precautions / Restrictions Precautions Precautions: Fall;Back Restrictions Weight Bearing Restrictions: No      Mobility Bed Mobility Overal bed mobility: Needs Assistance             General bed mobility comments: sitting EOB upon OT and PT arrival    Transfers Overall transfer level: Needs assistance Equipment used: Rolling walker (2 wheels) Transfers: Sit to/from Stand Sit to Stand: Min assist, +2 physical assistance, +2 safety/equipment           General transfer comment: min A of 2 due to increased pain and need for assist to come into standing from EOB then again from toilet      Balance Overall balance assessment: Needs assistance Sitting-balance support: Feet supported, Single extremity supported, Bilateral upper extremity supported Sitting balance-Leahy Scale: Fair     Standing balance support: Bilateral upper extremity supported, During functional activity, Reliant on assistive device for balance Standing balance-Leahy Scale: Poor Standing balance comment: Reliant on UE support                           ADL either performed or assessed with clinical judgement   ADL Overall ADL's : Needs assistance/impaired Eating/Feeding: Set up;Sitting   Grooming: Set up;Sitting   Upper Body Bathing: Minimal assistance;Sitting   Lower Body Bathing: Moderate assistance;Maximal assistance;Sit to/from stand;Sitting/lateral leans   Upper Body Dressing : Minimal assistance;Sitting;Standing   Lower Body Dressing: Maximal assistance;Total assistance;Sitting/lateral leans;Sit to/from stand   Toilet Transfer: Moderate assistance;Cueing for safety;Cueing for sequencing;Ambulation;BSC/3in1 Armed forces technical officer Details (indicate cue type and reason): BSC over top of toilet Toileting- Clothing Manipulation and Hygiene: Moderate assistance;Cueing for sequencing;Cueing for safety;Sitting/lateral lean;Sit to/from  stand Toileting - Clothing Manipulation Details (indicate  cue type and reason): for thoroughness in standing     Functional mobility during ADLs: Moderate assistance;Cueing for sequencing;Cueing for safety;Rolling walker (2 wheels) General ADL Comments: Patient presenting with significant back pain at incisional site after debridement, and need for increased asssit to complete all ADLs     Vision Baseline Vision/History: 1 Wears glasses Ability to See in Adequate Light: 0 Adequate Patient Visual Report: No change from baseline       Perception     Praxis      Pertinent Vitals/Pain Pain Assessment Pain Assessment: Faces Faces Pain Scale: Hurts whole lot Pain Location: low back Pain Descriptors / Indicators: Aching, Discomfort Pain Intervention(s): Limited activity within patient's tolerance, Monitored during session, Premedicated before session, Repositioned     Hand Dominance     Extremity/Trunk Assessment Upper Extremity Assessment Upper Extremity Assessment: Generalized weakness   Lower Extremity Assessment Lower Extremity Assessment: Defer to PT evaluation;Generalized weakness   Cervical / Trunk Assessment Cervical / Trunk Assessment: Back Surgery   Communication Communication Communication: No difficulties   Cognition Arousal/Alertness: Awake/alert Behavior During Therapy: WFL for tasks assessed/performed Overall Cognitive Status: Impaired/Different from baseline Area of Impairment: Attention, Memory, Following commands, Safety/judgement, Awareness, Problem solving                   Current Attention Level: Focused Memory: Decreased recall of precautions, Decreased short-term memory Following Commands: Follows one step commands with increased time Safety/Judgement: Decreased awareness of safety, Decreased awareness of deficits Awareness: Emergent   General Comments: increased time to complete all tasks, could be baseline, could not recall back  precautions     General Comments       Exercises     Shoulder Instructions      Home Living Family/patient expects to be discharged to:: Private residence Living Arrangements: Spouse/significant other Available Help at Discharge: Family;Available 24 hours/day Type of Home: House Home Access: Ramped entrance   Entrance Stairs-Rails: None Home Layout: One level     Bathroom Shower/Tub: Occupational psychologist: Standard Bathroom Accessibility: Yes   Home Equipment: Rollator (4 wheels);Wheelchair - manual;Shower seat - built in;BSC/3in1 Brewing technologist)          Prior Functioning/Environment Prior Level of Function : Independent/Modified Independent             Mobility Comments: using rollator since surgery ADLs Comments: wearing mostly dresses, needs assist for lower body dressing, is able to complete bathing in sitting        OT Problem List: Decreased strength;Decreased range of motion;Decreased activity tolerance;Impaired balance (sitting and/or standing);Decreased coordination;Decreased cognition;Decreased safety awareness;Decreased knowledge of use of DME or AE;Decreased knowledge of precautions;Pain      OT Treatment/Interventions: Self-care/ADL training;Therapeutic exercise;Energy conservation;DME and/or AE instruction;Manual therapy;Modalities;Therapeutic activities;Cognitive remediation/compensation;Patient/family education;Balance training    OT Goals(Current goals can be found in the care plan section) Acute Rehab OT Goals Patient Stated Goal: to be in less pain OT Goal Formulation: With patient Time For Goal Achievement: 10/19/22 Potential to Achieve Goals: Good  OT Frequency: Min 2X/week    Co-evaluation PT/OT/SLP Co-Evaluation/Treatment: Yes Reason for Co-Treatment: Complexity of the patient's impairments (multi-system involvement);For patient/therapist safety;To address functional/ADL transfers   OT goals addressed during session: ADL's  and self-care;Proper use of Adaptive equipment and DME;Strengthening/ROM      AM-PAC OT "6 Clicks" Daily Activity     Outcome Measure Help from another person eating meals?: A Little Help from another person taking care of personal grooming?: A Little Help  from another person toileting, which includes using toliet, bedpan, or urinal?: A Lot Help from another person bathing (including washing, rinsing, drying)?: A Lot Help from another person to put on and taking off regular upper body clothing?: A Little Help from another person to put on and taking off regular lower body clothing?: A Lot 6 Click Score: 15   End of Session Equipment Utilized During Treatment: Gait belt;Rolling walker (2 wheels) Nurse Communication: Mobility status  Activity Tolerance: Patient limited by fatigue;Patient limited by lethargy;Patient limited by pain Patient left: in chair;with call bell/phone within reach;with chair alarm set  OT Visit Diagnosis: Unsteadiness on feet (R26.81);Other abnormalities of gait and mobility (R26.89);Muscle weakness (generalized) (M62.81);Pain Pain - part of body:  (Back)                Time: [id number] OT Time Calculation (min): 31 min Charges:  OT General Charges $OT Visit: 1 Visit OT Evaluation $OT Eval Moderate Complexity: 1 Mod  Corinne Ports E. Tika Hannis, OTR/L Acute Rehabilitation Services [phone]   [name] 10/05/2022, 11:40 AM

## 2022-10-05 NOTE — Consult Note (Addendum)
[facility]    Date of Admission:  10/04/2022   Total days of inpatient antibiotics 1        Reason for Consult: Post op wound    Principal Problem:   Wound infection after surgery   Assessment:  67 YF admitted wit post-op wound:  #Post-op wound SP L2-L5 decompression and stabilization with fusion -Pt underwent L2-L5 decompression, fusion on 09/20/22 with Dr. Arnoldo Morale -Pt states that in the past week wound was tender, looked red. Started on keflex on Wednesday by [facility] provider(per pt). Subsequently had purulence from wound -No fever, leukocytosis on admission -Taken to the OR on 3/8 for debridement.  Per OR note wound was superficial.  Noted that no pus was extracted from below the fascia.  Noted that fascia itself was closed.  Recommendations:  -Start vancomycin and ceftriaxone. Anticipate a shorter course of antibiotics(2 weeks) -Follow OR Cx and path -Follow blood Cx   #Tobacco abuse - Smoking cessation to support wound healing  #DM  -A1C 6.0 Microbiology:   Antibiotics: Ceftriaxone 3/07- Vancomycin 3/07- Cultures: Blood 3/07-   HPI: Gabriella [name] is a 67 y.o. female with history of arthritis, patient cancer, diabetes pretension, PUD, obesity tobacco abuse recently underwent Lumbar surgical decompression/arthrodesis on 09/20/22 with Dr. Arnoldo Morale admitted with post-op wound. Pt started having redness at the wound this week, starting on keflex. She subsequently had drainage from site with pus.    Review of Systems: Review of Systems  All other systems reviewed and are negative.   Past Medical History:  Diagnosis Date   Arthritis    Cancer ([place])    FACIAL CANCER    Dependent edema    Diabetes mellitus    pt reports she was pre-DM in the past but lost weight and is no longer DM   Headache    Hypertension    Hypertriglyceridemia    Hypokalemia    Metrorrhagia    Obesity    Peptic ulcer disease    PONV (postoperative  nausea and vomiting)    SUI (stress urinary incontinence, female)    TIA (transient ischemic attack)    "I might've had a TIA in summer 2022"   Tobacco abuse     Social History   Tobacco Use   Smoking status: Every Day    Packs/day: 1.00    Types: Cigarettes    Last attempt to quit: 07/30/2008    Years since quitting: 14.1   Smokeless tobacco: Never  Vaping Use   Vaping Use: Never used  Substance Use Topics   Alcohol use: No    Alcohol/week: 0.0 standard drinks of alcohol   Drug use: Yes    Types: Marijuana    Comment: once a month    Family History  Problem Relation Age of Onset   Hypertension Mother    Dementia Mother    Lung cancer Father    Breast cancer Neg Hx    Scheduled Meds:  docusate sodium  100 mg Oral BID   enoxaparin (LOVENOX) injection  40 mg Subcutaneous Q24H   senna  1 tablet Oral BID   sodium chloride flush  3 mL Intravenous Q12H   Continuous Infusions:  sodium chloride 250 mL (10/04/22 1850)   cefTRIAXone (ROCEPHIN)  IV 2 g (10/05/22 0854)   lactated ringers 10 mL/hr at 10/04/22 1416   methocarbamol (ROBAXIN) IV     vancomycin     PRN Meds:.acetaminophen **OR** acetaminophen, bisacodyl,  menthol-cetylpyridinium **OR** phenol, methocarbamol **OR** methocarbamol (ROBAXIN) IV, morphine injection, ondansetron **OR** ondansetron (ZOFRAN) IV, oxyCODONE-acetaminophen, polyethylene glycol, sodium chloride flush, sodium phosphate No Known Allergies  OBJECTIVE: Blood pressure (!) 104/58, pulse 87, temperature 98.4 F (36.9 C), temperature source Oral, resp. rate 20, height '5\' 5"'$  (1.651 m), weight 96.2 kg, SpO2 95 %.  Physical Exam Constitutional:      Appearance: Normal appearance.  HENT:     Head: Normocephalic and atraumatic.     Right Ear: Tympanic membrane normal.     Left Ear: Tympanic membrane normal.     Nose: Nose normal.     Mouth/Throat:     Mouth: Mucous membranes are moist.  Eyes:     Extraocular Movements: Extraocular movements intact.      Conjunctiva/sclera: Conjunctivae normal.     Pupils: Pupils are equal, round, and reactive to light.  Cardiovascular:     Rate and Rhythm: Normal rate and regular rhythm.     Heart sounds: No murmur heard.    No friction rub. No gallop.  Pulmonary:     Effort: Pulmonary effort is normal.     Breath sounds: Normal breath sounds.  Abdominal:     General: Abdomen is flat.     Palpations: Abdomen is soft.  Musculoskeletal:        General: Normal range of motion.  Skin:    General: Skin is warm and dry.     Comments: Lumbar wound with staples in place  Neurological:     General: No focal deficit present.     Mental Status: She is alert and oriented to person, place, and time.  Psychiatric:        Mood and Affect: Mood normal.     Lab Results Lab Results  Component Value Date   WBC 8.7 10/04/2022   HGB 9.8 (L) 10/04/2022   HCT 30.0 (L) 10/04/2022   MCV 101.7 (H) 10/04/2022   PLT 508 (H) 10/04/2022    Lab Results  Component Value Date   CREATININE 0.77 10/04/2022   BUN 15 10/04/2022   NA 136 10/04/2022   K 4.1 10/04/2022   CL 99 10/04/2022   CO2 28 10/04/2022    Lab Results  Component Value Date   ALT 30 (H) 09/11/2022   AST 29 09/11/2022   ALKPHOS 84 11/06/2016   BILITOT 0.4 09/11/2022       Laurice Record, [facility] 10/05/2022, 2:33 PM

## 2022-10-05 NOTE — Evaluation (Signed)
Physical Therapy Evaluation Patient Details Name: Gabriella [name] MRN: [id number] DOB: September 05, 1955 Today's Date: 10/05/2022  History of Present Illness  Patient is a 67 yo female presenting to the hospital on 10/04/22 for debridement of wound infection status post surgical decompression L2-L5 on 09/20/2022.  PMH includes OA, cancer, DM, HTN, TIA.   Clinical Impression  Pt presents with condition above and deficits mentioned below, see PT Problem List. PTA, she was mod I to ambulate using her rollator, mod I using her lift chair for transfers, and needing some assistance for bed mobility since her back surgery 09/20/22. Pt lives with her husband in a  1-level house with a ramped entrance. Currently, pt is limited primarily by her back pain, needing increased time to perform all tasks and mobility. Pt required minA to scoot anteriorly on EOB, minAx2 to transfer to stand, and min guard-minA to ambulate a short distance with the RW. She displays deficits in balance, activity tolerance, power, and gross strength. She is at risk for falls. Recommending pt follow-up with HHPT upon d/c home with family support. Will continue to follow acutely.      Recommendations for follow up therapy are one component of a multi-disciplinary discharge planning process, led by the attending physician.  Recommendations may be updated based on patient status, additional functional criteria and insurance authorization.  Follow Up Recommendations Home health PT Can patient physically be transported by private vehicle: Yes    Assistance Recommended at Discharge Intermittent Supervision/Assistance  Patient can return home with the following  A little help with walking and/or transfers;Assistance with cooking/housework;Direct supervision/assist for medications management;Direct supervision/assist for financial management;Assist for transportation;Help with stairs or ramp for entrance;A lot of help with  bathing/dressing/bathroom    Equipment Recommendations None recommended by PT (has all needed DME)  Recommendations for Other Services       Functional Status Assessment Patient has had a recent decline in their functional status and demonstrates the ability to make significant improvements in function in a reasonable and predictable amount of time.     Precautions / Restrictions Precautions Precautions: Fall;Back Precaution Booklet Issued: No Precaution Comments: reviewed back precautions, no recall by pt Required Braces or Orthoses:  (no brace needed orders with PA verbally confirming this 3/8 due to pt's wound, pt was using lumbar corset initially after PLIF) Restrictions Weight Bearing Restrictions: No      Mobility  Bed Mobility Overal bed mobility: Needs Assistance             General bed mobility comments: sitting EOB upon OT and PT arrival, needing minA with use of bed pad to scoot closer to EOB    Transfers Overall transfer level: Needs assistance Equipment used: Rolling walker (2 wheels) Transfers: Sit to/from Stand Sit to Stand: Min assist, +2 physical assistance, +2 safety/equipment           General transfer comment: min A of 2 due to increased pain and need for assist to come into standing from EOB then again from toilet    Ambulation/Gait Ambulation/Gait assistance: Min guard, Min assist, +2 safety/equipment Gait Distance (Feet): 25 Feet (x2 bouts of ~25 ft each bout) Assistive device: Rolling walker (2 wheels) Gait Pattern/deviations: Step-through pattern, Decreased stride length, Trunk flexed, Shuffle Gait velocity: reduced Gait velocity interpretation: <1.31 ft/sec, indicative of household ambulator   General Gait Details: Pt with slow, shuffling steps and slightly flexed posture, limited by back pain. Min guard-minA for balance and safety  Stairs  Wheelchair Mobility    Modified Rankin (Stroke Patients Only)        Balance Overall balance assessment: Needs assistance Sitting-balance support: Feet supported, Single extremity supported, Bilateral upper extremity supported Sitting balance-Leahy Scale: Fair     Standing balance support: Bilateral upper extremity supported, During functional activity, Reliant on assistive device for balance Standing balance-Leahy Scale: Poor Standing balance comment: Reliant on UE support                             Pertinent Vitals/Pain Pain Assessment Pain Assessment: Faces Faces Pain Scale: Hurts whole lot Pain Location: low back Pain Descriptors / Indicators: Aching, Discomfort Pain Intervention(s): Limited activity within patient's tolerance, Monitored during session, Premedicated before session, Repositioned    Home Living Family/patient expects to be discharged to:: Private residence Living Arrangements: Spouse/significant other Available Help at Discharge: Family;Available 24 hours/day Type of Home: House Home Access: Ramped entrance Entrance Stairs-Rails: None     Home Layout: One level Home Equipment: Rollator (4 wheels);Wheelchair - manual;Shower seat - built in;BSC/3in1 Brewing technologist)      Prior Function Prior Level of Function : Independent/Modified Independent             Mobility Comments: using rollator since surgery along with lift chair for transfers. asisstance needed with bed mobility since surgery ADLs Comments: wearing mostly dresses, needs assist for lower body dressing, is able to complete bathing in sitting     Hand Dominance        Extremity/Trunk Assessment   Upper Extremity Assessment Upper Extremity Assessment: Defer to OT evaluation    Lower Extremity Assessment Lower Extremity Assessment: Generalized weakness (MMT scores of 4 to 4+ bil, fairly symmetrical; denied numbness/tingling currently bil, but reports intermittent tingling in bil anterior thighs)    Cervical / Trunk Assessment Cervical /  Trunk Assessment: Back Surgery  Communication   Communication: No difficulties  Cognition Arousal/Alertness: Awake/alert Behavior During Therapy: WFL for tasks assessed/performed Overall Cognitive Status: Impaired/Different from baseline Area of Impairment: Attention, Memory, Following commands, Safety/judgement, Awareness, Problem solving                   Current Attention Level: Focused Memory: Decreased recall of precautions, Decreased short-term memory Following Commands: Follows one step commands with increased time Safety/Judgement: Decreased awareness of safety, Decreased awareness of deficits Awareness: Emergent   General Comments: increased time to complete all tasks, could be baseline, could not recall back precautions        General Comments      Exercises     Assessment/Plan    PT Assessment Patient needs continued PT services  PT Problem List Decreased strength;Decreased activity tolerance;Decreased balance;Decreased mobility;Decreased cognition;Decreased knowledge of use of DME;Decreased safety awareness;Decreased knowledge of precautions       PT Treatment Interventions DME instruction;Gait training;Stair training;Functional mobility training;Therapeutic activities;Therapeutic exercise;Balance training;Neuromuscular re-education;Cognitive remediation;Patient/family education    PT Goals (Current goals can be found in the Care Plan section)  Acute Rehab PT Goals Patient Stated Goal: to improve PT Goal Formulation: With patient Time For Goal Achievement: 10/19/22 Potential to Achieve Goals: Fair    Frequency Min 3X/week     Co-evaluation PT/OT/SLP Co-Evaluation/Treatment: Yes Reason for Co-Treatment: Complexity of the patient's impairments (multi-system involvement);For patient/therapist safety;To address functional/ADL transfers PT goals addressed during session: Balance;Mobility/safety with mobility;Proper use of DME OT goals addressed during  session: ADL's and self-care;Proper use of Adaptive equipment and DME;Strengthening/ROM  AM-PAC PT "6 Clicks" Mobility  Outcome Measure Help needed turning from your back to your side while in a flat bed without using bedrails?: A Little Help needed moving from lying on your back to sitting on the side of a flat bed without using bedrails?: A Little Help needed moving to and from a bed to a chair (including a wheelchair)?: A Little Help needed standing up from a chair using your arms (e.g., wheelchair or bedside chair)?: A Lot Help needed to walk in hospital room?: A Little Help needed climbing 3-5 steps with a railing? : A Lot 6 Click Score: 16    End of Session Equipment Utilized During Treatment: Gait belt Activity Tolerance: Patient limited by pain;Patient tolerated treatment well Patient left: in chair;with call bell/phone within reach;with chair alarm set Nurse Communication: Mobility status PT Visit Diagnosis: Other abnormalities of gait and mobility (R26.89);Muscle weakness (generalized) (M62.81);Pain;Unsteadiness on feet (R26.81);Difficulty in walking, not elsewhere classified (R26.2) Pain - part of body:  (back)    Time: [id number] PT Time Calculation (min) (ACUTE ONLY): 31 min   Charges:   PT Evaluation $PT Eval Moderate Complexity: 1 Mod          Moishe Spice, PT, DPT Acute Rehabilitation Services  Office: [phone]   Orvan Falconer 10/05/2022, 12:03 PM

## 2022-10-05 NOTE — Progress Notes (Signed)
   Providing Compassionate, Quality Care - Together   Subjective: Patient reports anticipated postoperative soreness at her low back.  Objective: Vital signs in last 24 hours: Temp:  [97.7 F (36.5 C)-98.7 F (37.1 C)] 98.3 F (36.8 C) (03/08 0832) Pulse Rate:  [70-90] 77 (03/08 0832) Resp:  [13-20] 18 (03/08 0832) BP: (98-134)/(43-69) 98/60 (03/08 0832) SpO2:  [86 %-100 %] 99 % (03/08 0832) Weight:  [96.2 kg] 96.2 kg (03/07 1405)  Intake/Output from previous day: 03/07 0701 - 03/08 0700 In: 1209.4 [I.V.:1009.4; IV Piggyback:200] Out: 135 [Drains:35; Blood:100] Intake/Output this shift: Total I/O In: -  Out: 800 [Urine:800]  Alert and oriented x 4 PERRLA CN II-XII grossly intact MAE, Strength and sensation intact Incision is covered with gauze; Dressing is clean, dry, and intact Hemovac with 35 mL output overnight  Lab Results: Recent Labs    10/04/22 1450 10/04/22 1912  WBC 9.2 8.7  HGB 10.7* 9.8*  HCT 32.9* 30.0*  PLT 490* 508*   BMET Recent Labs    10/04/22 1450 10/04/22 1912  NA 136  --   K 4.1  --   CL 99  --   CO2 28  --   GLUCOSE 112*  --   BUN 15  --   CREATININE 0.75 0.77  CALCIUM 8.7*  --     Studies/Results: No results found.  Assessment/Plan: Patient underwent wound washout with Dr. Ellene Route on 10/05/2022. Blood and wound cultures with no growth so far.   LOS: 1 day   -Encourage patient to mobilize. She needs to be out of bed. -Hemovac removed. -Dressing changed. Wound coated with betadine and new gauze dressing applied. This should be done daily.   Viona Gilmore, DNP, AGNP-C Nurse Practitioner  [facility] [place] [address], [place], [place], [place] [address] P: [phone]    F: [phone]  10/05/2022, 10:42 AM

## 2022-10-05 NOTE — Progress Notes (Signed)
Patient ID: Gabriella [name], female   DOB: August 05, 1955, 67 y.o.   MRN: [id number] Vital signs are stable Incision remains clean and dry Drain removed per Arnetha Massy Having some substantial pain Will add Toradol to pain regimen Continue IV antibiotics per recommendations of infectious disease

## 2022-10-05 NOTE — TOC Initial Note (Signed)
Transition of Care [facility]) - Initial/Assessment Note    Patient Details  Name: Gabriella [name] MRN: [id number] Date of Birth: 03-Oct-1955  Transition of Care [facility]) CM/SW Contact:    Pollie Friar, RN Phone Number: 10/05/2022, 3:19 PM  Clinical Narrative:                 Pt admitted with surgical incision infection and is s/p I&D. She is from home with her spouse. He or her cousin can provide needed assistance at home. Pt has wheelchair/ walker/ shower seat at home. Pt was already active with [facility] for [facility] services. CM has updated Amy with [facility] of her admission. Resumption orders added.  Pt manages her own medications at home and denies any issues. [facility] for [facility] pharmacy needs.  Pts spouse provides needed transportation. CM has asked the bedside RN to educate the pts spouse on either dressing changes or betadine application to her incision. TOC following.  Expected Discharge Plan: [place]     Patient Goals and CMS Choice            Expected Discharge Plan and Services                                   HH Arranged: RN, PT, OT [facility] Agency: [place] Date [facility] Agency Contacted: 10/05/22   Representative spoke with at [place]: Amy  Prior Living Arrangements/Services                       Activities of Daily Living   ADL Screening (condition at time of admission) Patient's cognitive ability adequate to safely complete daily activities?: Yes Is the patient deaf or have difficulty hearing?: No Does the patient have difficulty seeing, even when wearing glasses/contacts?: No Does the patient have difficulty concentrating, remembering, or making decisions?: No Patient able to express need for assistance with ADLs?: Yes Does the patient have difficulty dressing or bathing?: No Independently performs ADLs?: Yes (appropriate for developmental age) Does the patient have difficulty walking or climbing  stairs?: Yes Weakness of Legs: Both Weakness of Arms/Hands: None  Permission Sought/Granted                  Emotional Assessment              Admission diagnosis:  Wound infection after surgery [T81.49XA] Patient Active Problem List   Diagnosis Date Noted   Wound infection after surgery 10/04/2022   Spondylolisthesis 09/20/2022   TIA (transient ischemic attack) 09/05/2020   Gait abnormality 09/05/2020   Diabetes mellitus without complication ([place]) 0000000   S/P reverse total shoulder arthroplasty, 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   Chronic back pain 10/05/2015   Musculoskeletal pain 02/23/2014   Proteinuria [id number]   Metabolic syndrome [id number]   Foot pain 08/31/2012   Achilles tendinitis 08/31/2012   Glucose intolerance (impaired glucose tolerance) 05/27/2011   Hyperlipidemia 05/27/2011   Hypertension 05/27/2011   Obesity 05/27/2011   History of peptic ulcer disease 05/27/2011   PCP:  Elby Showers, MD Pharmacy[id number] [id number]: [phone]: [phone]    Social Determinants of Health (SDOH) Social History: SDOH Screenings   Depression (PHQ2-9): Low Risk  (09/17/2022)  Tobacco Use: High Risk (10/05/2022)   SDOH Interventions:  Readmission Risk Interventions    09/26/2022   12:23 PM  Readmission Risk Prevention Plan  Post Dischage Appt Complete  Medication Screening Complete  Transportation Screening Complete

## 2022-10-05 NOTE — Care Management Important Message (Signed)
Important Message  Patient Details  Name: Gabriella [name] MRN: [id number] Date of Birth: 1956/04/12   Medicare Important Message Given:  Yes     Hannah Beat 10/05/2022, 12:28 PM

## 2022-10-05 NOTE — Progress Notes (Signed)
Morphine got dismantled and spilled out as soon as the plastic was peeled  from the  medicine.The medicine was wasted with Lanae Boast RN and new one removed and administered to the patient.

## 2022-10-06 NOTE — Progress Notes (Signed)
Neurosurgery Service Progress Note  Subjective: No acute events overnight. Continues to have back pain, but it is improving. No voiced complaints.   Objective: Vitals:   10/05/22 1631 10/05/22 1930 10/06/22 0318 10/06/22 0741  BP: (!) 113/58 (!) 100/55 (!) 107/49 97/65  Pulse: 88 78 67 72  Resp: '18 16 12 18  '$ Temp: 98.1 F (36.7 C) 98 F (36.7 C) 98.9 F (37.2 C) 98.1 F (36.7 C)  TempSrc: Oral   Oral  SpO2: 97% 93% 97% 98%  Weight:      Height:        Physical Exam: Strength 5/5 x4 and SILTx4, incision c/d/I without active drainage   Assessment & Plan: 67 y.o. female s/p I&D of lumbar wound, recovering well.  -continue mobilization and OOB -continue to change dressing daily with betadine coating and new gauze -wound cultures have grown morganella morganii   -continue antibiotic regimen per ID recommendations   Norm Parcel, PA-C 10/06/22 9:45 AM

## 2022-10-06 NOTE — Progress Notes (Addendum)
Physical Therapy Treatment Patient Details Name: Gabriella [name] MRN: [id number] DOB: [birth year] Today's Date: 10/06/2022   History of Present Illness Patient is a 67 yo female presenting to the hospital on 10/04/22 for debridement of wound infection status post surgical decompression L2-L5 on 09/20/2022.  PMH includes OA, cancer, DM, HTN, TIA.    PT Comments    Pt making steady progress towards her physical therapy goals, exhibiting improved activity tolerance and ambulation distance. Pt continues to report throbbing pain in back, but states it does not increase with activity. Pt ambulating 120 ft with a Rollator at a min guard assist level. D/c plan remains appropriate.     Recommendations for follow up therapy are one component of a multi-disciplinary discharge planning process, led by the attending physician.  Recommendations may be updated based on patient status, additional functional criteria and insurance authorization.  Follow Up Recommendations  Home health PT Can patient physically be transported by private vehicle: Yes   Assistance Recommended at Discharge Intermittent Supervision/Assistance  Patient can return home with the following A little help with walking and/or transfers;Assistance with cooking/housework;Direct supervision/assist for medications management;Direct supervision/assist for financial management;Assist for transportation;Help with stairs or ramp for entrance;A lot of help with bathing/dressing/bathroom   Equipment Recommendations  None recommended by PT (has all needed DME)    Recommendations for Other Services       Precautions / Restrictions Precautions Precautions: Fall;Back Precaution Booklet Issued: No Required Braces or Orthoses:  (no brace needed orders with PA verbally confirming this 3/8 due to pt's wound, pt was using lumbar corset initially after PLIF) Restrictions Weight Bearing Restrictions: No     Mobility  Bed Mobility Overal bed  mobility: Modified Independent             General bed mobility comments: + use of bed rail, good log roll technique    Transfers Overall transfer level: Needs assistance Equipment used: Rollator (4 wheels) Transfers: Sit to/from Stand Sit to Stand: Min guard           General transfer comment: Cues for hand placement    Ambulation/Gait Ambulation/Gait assistance: Min guard Gait Distance (Feet): 120 Feet Assistive device: Rollator (4 wheels) Gait Pattern/deviations: Step-through pattern, Decreased stride length, Trunk flexed, Shuffle Gait velocity: reduced     General Gait Details: Pt with slow, shuffling steps and slightly flexed posture, min guard for safety   Stairs             Wheelchair Mobility    Modified Rankin (Stroke Patients Only)       Balance Overall balance assessment: Needs assistance Sitting-balance support: Feet supported, Single extremity supported, Bilateral upper extremity supported Sitting balance-Leahy Scale: Good     Standing balance support: Bilateral upper extremity supported, During functional activity, Reliant on assistive device for balance Standing balance-Leahy Scale: Poor Standing balance comment: Reliant on UE support                            Cognition Arousal/Alertness: Awake/alert Behavior During Therapy: WFL for tasks assessed/performed Overall Cognitive Status: Within Functional Limits for tasks assessed                                 General Comments: WFL for basic mobility        Exercises General Exercises - Lower Extremity Long Arc Quad: Both, 5 reps, Seated Hip Flexion/Marching: Both,  5 reps, Seated    General Comments        Pertinent Vitals/Pain Pain Assessment Pain Assessment: Faces Faces Pain Scale: Hurts even more Pain Location: low back Pain Descriptors / Indicators: Operative site guarding, Throbbing Pain Intervention(s): Limited activity within patient's  tolerance, Monitored during session, Premedicated before session    Home Living                          Prior Function            PT Goals (current goals can now be found in the care plan section) Acute Rehab PT Goals Patient Stated Goal: to improve PT Goal Formulation: With patient Time For Goal Achievement: 10/19/22 Potential to Achieve Goals: Good Progress towards PT goals: Progressing toward goals    Frequency    Min 3X/week      PT Plan Current plan remains appropriate    Co-evaluation              AM-PAC PT "6 Clicks" Mobility   Outcome Measure  Help needed turning from your back to your side while in a flat bed without using bedrails?: None Help needed moving from lying on your back to sitting on the side of a flat bed without using bedrails?: None Help needed moving to and from a bed to a chair (including a wheelchair)?: A Little Help needed standing up from a chair using your arms (e.g., wheelchair or bedside chair)?: A Little Help needed to walk in hospital room?: A Little Help needed climbing 3-5 steps with a railing? : A Lot 6 Click Score: 19    End of Session Equipment Utilized During Treatment: Gait belt Activity Tolerance: Patient tolerated treatment well Patient left: in chair;with call bell/phone within reach;with chair alarm set Nurse Communication: Mobility status PT Visit Diagnosis: Other abnormalities of gait and mobility (R26.89);Muscle weakness (generalized) (M62.81);Pain;Unsteadiness on feet (R26.81);Difficulty in walking, not elsewhere classified (R26.2) Pain - part of body:  (back)     Time: [id number] PT Time Calculation (min) (ACUTE ONLY): 22 min  Charges:  $Therapeutic Activity: 8-22 mins                     Wyona Almas, PT, DPT Acute Rehabilitation Services Office [place] 10/06/2022, 1:13 PM

## 2022-10-07 DIAGNOSIS — T8149XA Infection following a procedure, other surgical site, initial encounter: Secondary | ICD-10-CM | POA: Diagnosis not present

## 2022-10-07 NOTE — Progress Notes (Signed)
[place] for Infectious Disease  Date of Admission:  10/04/2022   Total days of inpatient antibiotics 3  Principal Problem:   Wound infection after surgery          Assessment:  19 YF admitted wit post-op wound:   #Post-op wound SP L2-L5 decompression and stabilization with fusion with Cx+ Morgenella morganaii and PsA -Pt underwent L2-L5 decompression, fusion on 09/20/22 with Dr. Arnoldo Morale -Pt states that in the past week wound was tender, looked red. Started on keflex on Wednesday by [facility] provider(per pt). Subsequently had purulence from wound -No fever, leukocytosis on admission -Taken to the OR on 3/8 for debridement.  Per OR note wound was superficial.  Noted that no pus was extracted from below the fascia.  Noted that fascia itself was closed.   Recommendations:  -Continue cefepime. Hopefully PsA sens cipro/levaquin, Plan on 3 weeks of antibiotics from OR for  post-op wound. Although the wound is superficial per OR note, would prefer a longer course after inspection of the wound today.  -Follow OR Cx and path -Follow blood Cx     #Tobacco abuse - Smoking cessation to support wound healing   #DM  -A1C 6.0   Microbiology:   Antibiotics: Ceftriaxone 3/07-3/8 Vancomycin 3/07 Cefepime 3/8- Cultures: Blood 3/07-NG     SUBJECTIVE: Resting in bed. Husband at bedside Interval: afebrile overnight  Review of Systems: Review of Systems  All other systems reviewed and are negative.    Scheduled Meds:  docusate sodium  100 mg Oral BID   enoxaparin (LOVENOX) injection  40 mg Subcutaneous Q24H   senna  1 tablet Oral BID   sodium chloride flush  3 mL Intravenous Q12H   Continuous Infusions:  sodium chloride Stopped (10/06/22 0943)   ceFEPime (MAXIPIME) IV Stopped (10/07/22 0715)   lactated ringers 10 mL/hr at 10/04/22 1416   methocarbamol (ROBAXIN) IV     PRN Meds:.acetaminophen **OR** acetaminophen, bisacodyl, menthol-cetylpyridinium **OR** phenol,  methocarbamol **OR** methocarbamol (ROBAXIN) IV, morphine injection, ondansetron **OR** ondansetron (ZOFRAN) IV, oxyCODONE-acetaminophen, polyethylene glycol, sodium chloride flush, sodium phosphate No Known Allergies  OBJECTIVE: Vitals:   10/07/22 0058 10/07/22 0349 10/07/22 0829 10/07/22 1141  BP: 129/60 (!) 144/62 (!) 167/91 125/71  Pulse: 85 85 89 94  Resp: '18 15 18 18  '$ Temp: 98.1 F (36.7 C) 98.4 F (36.9 C) 98 F (36.7 C) 98.2 F (36.8 C)  TempSrc: Oral Oral    SpO2: 97% 95% 93% 94%  Weight:      Height:       Body mass index is 35.28 kg/m.  Physical Exam Constitutional:      Appearance: Normal appearance.  HENT:     Head: Normocephalic and atraumatic.     Right Ear: Tympanic membrane normal.     Left Ear: Tympanic membrane normal.     Nose: Nose normal.     Mouth/Throat:     Mouth: Mucous membranes are moist.  Eyes:     Extraocular Movements: Extraocular movements intact.     Conjunctiva/sclera: Conjunctivae normal.     Pupils: Pupils are equal, round, and reactive to light.  Cardiovascular:     Rate and Rhythm: Normal rate and regular rhythm.     Heart sounds: No murmur heard.    No friction rub. No gallop.  Pulmonary:     Effort: Pulmonary effort is normal.     Breath sounds: Normal breath sounds.  Abdominal:     General: Abdomen is  flat.     Palpations: Abdomen is soft.  Musculoskeletal:        General: Normal range of motion.  Skin:    General: Skin is warm and dry.  Neurological:     General: No focal deficit present.     Mental Status: She is alert and oriented to person, place, and time.  Psychiatric:        Mood and Affect: Mood normal.       Lab Results Lab Results  Component Value Date   WBC 8.7 10/04/2022   HGB 9.8 (L) 10/04/2022   HCT 30.0 (L) 10/04/2022   MCV 101.7 (H) 10/04/2022   PLT 508 (H) 10/04/2022    Lab Results  Component Value Date   CREATININE 0.77 10/04/2022   BUN 15 10/04/2022   NA 136 10/04/2022   K 4.1  10/04/2022   CL 99 10/04/2022   CO2 28 10/04/2022    Lab Results  Component Value Date   ALT 30 (H) 09/11/2022   AST 29 09/11/2022   ALKPHOS 84 11/06/2016   BILITOT 0.4 09/11/2022        Laurice Record, [place] for Infectious Disease [facility] 10/07/2022, 12:59 PM

## 2022-10-07 NOTE — Progress Notes (Signed)
Neurosurgery Service Progress Note  Subjective: No acute events overnight. States that she is feeling well. Back pain is improved. No voiced complaints.    Objective: Vitals:   10/06/22 1519 10/06/22 2013 10/07/22 0058 10/07/22 0349  BP: 134/69 122/60 129/60 (!) 144/62  Pulse: 88 76 85 85  Resp: '17 16 18 15  '$ Temp: 98.1 F (36.7 C) 98.2 F (36.8 C) 98.1 F (36.7 C) 98.4 F (36.9 C)  TempSrc: Oral Oral Oral Oral  SpO2: 99% 97% 97% 95%  Weight:      Height:        Physical Exam: Strength 5/5 x4 and SILTx4, incision c/d/I without active drainage  Assessment & Plan: 68 y.o. female s/p I&D of lumbar wound, recovering well.   -continue mobilization and OOB -continue to change dressing daily with betadine coating and new gauze -wound cultures have grown morganella morganii   -continue antibiotic regimen per ID recommendations   Norm Parcel, PA-C 10/07/22 8:14 AM

## 2022-10-08 DIAGNOSIS — T8149XA Infection following a procedure, other surgical site, initial encounter: Secondary | ICD-10-CM | POA: Diagnosis not present

## 2022-10-08 LAB — SURGICAL PATHOLOGY

## 2022-10-08 MED ORDER — CIPROFLOXACIN HCL 750 MG PO TABS
750.0000 mg | ORAL_TABLET | Freq: Two times a day (BID) | ORAL | Status: DC
  Administered 2022-10-10: 750 mg via ORAL
  Filled 2022-10-08: qty 1

## 2022-10-08 NOTE — Progress Notes (Signed)
[facility] for Infectious Disease  Date of Admission:  10/04/2022   Total days of inpatient antibiotics 3  Principal Problem:   Wound infection after surgery          Assessment:  46 YF admitted wit post-op wound:   #Post-op wound SP L2-L5 decompression and stabilization with fusion with Cx+ Morgenella morganaii and PsA -Pt underwent L2-L5 decompression, fusion on 09/20/22 with Dr. Arnoldo Morale -Pt states that in the past week wound was tender, looked red. Started on keflex on Wednesday by [facility] provider(per pt). Subsequently had purulence from wound -No fever, leukocytosis on admission -Taken to the OR on 3/8 for debridement.  Per OR note wound was superficial.  Noted that no pus was extracted from below the fascia.  Noted that fascia itself was closed.   -3/11 cultures result finalized and both PsA and Morgenella susceptible to cipro. The wound incision is without deshicence or sign of abscess or cellulitis. Reasonable to transition to oral abx on discharge to finish the 3 weeks treatment planned 3/09-3/30   Recommendations:  -can continue cefepime while inhouse for another 2 days, then could transition to ciprofloxacin to finish 3 week course by 10/27/22 -discussed with primary team       I spent more than 50 minute reviewing data/chart, and coordinating care and >50% direct face to face time providing counseling/discussing diagnostics/treatment plan with patient   Microbiology:   Antibiotics: Ceftriaxone 3/07-3/8 Vancomycin 3/07 Cefepime 3/8- Cultures: Blood 3/07-NG     SUBJECTIVE: Wound feels well minimal pain Afebrile Tolerating abx   Review of Systems: Review of Systems  All other systems reviewed and are negative.    Scheduled Meds:  docusate sodium  100 mg Oral BID   enoxaparin (LOVENOX) injection  40 mg Subcutaneous Q24H   senna  1 tablet Oral BID   sodium chloride flush  3 mL Intravenous Q12H   Continuous Infusions:  sodium chloride  Stopped (10/06/22 0943)   ceFEPime (MAXIPIME) IV 2 g (10/08/22 1340)   lactated ringers 10 mL/hr at 10/04/22 1416   methocarbamol (ROBAXIN) IV     PRN Meds:.acetaminophen **OR** acetaminophen, bisacodyl, menthol-cetylpyridinium **OR** phenol, methocarbamol **OR** methocarbamol (ROBAXIN) IV, morphine injection, ondansetron **OR** ondansetron (ZOFRAN) IV, oxyCODONE-acetaminophen, polyethylene glycol, sodium chloride flush, sodium phosphate No Known Allergies  OBJECTIVE: Vitals:   10/08/22 0012 10/08/22 0445 10/08/22 0904 10/08/22 1104  BP: 128/63 (!) 142/61 (!) 142/71 (!) 163/80  Pulse: 89 89 79 77  Resp: '16 16 16 18  '$ Temp: 98.5 F (36.9 C) 98.3 F (36.8 C) 98.6 F (37 C) 98.3 F (36.8 C)  TempSrc: Oral Oral Oral Oral  SpO2: 95% 96% 99% 96%  Weight:      Height:       Body mass index is 35.28 kg/m.  Physical Exam Constitutional:      Appearance: Normal appearance.  HENT:     Head: Normocephalic and atraumatic.     Right Ear: Tympanic membrane normal.     Left Ear: Tympanic membrane normal.     Nose: Nose normal.     Mouth/Throat:     Mouth: Mucous membranes are moist.  Eyes:     Extraocular Movements: Extraocular movements intact.     Conjunctiva/sclera: Conjunctivae normal.     Pupils: Pupils are equal, round, and reactive to light.  Cardiovascular:     Rate and Rhythm: Normal rate and regular rhythm.     Heart sounds: No murmur heard.  No friction rub. No gallop.  Pulmonary:     Effort: Pulmonary effort is normal.     Breath sounds: Normal breath sounds.  Abdominal:     General: Abdomen is flat.     Palpations: Abdomen is soft.  Musculoskeletal:        General: Normal range of motion.  Skin:    General: Skin is warm and dry.     Comments: Incision without dehiscence, discharge, fluctuance, tenderness, or erythema  Staples intact  Neurological:     General: No focal deficit present.     Mental Status: She is alert and oriented to person, place, and time.   Psychiatric:        Mood and Affect: Mood normal.       Lab Results Lab Results  Component Value Date   WBC 8.7 10/04/2022   HGB 9.8 (L) 10/04/2022   HCT 30.0 (L) 10/04/2022   MCV 101.7 (H) 10/04/2022   PLT 508 (H) 10/04/2022    Lab Results  Component Value Date   CREATININE 0.77 10/04/2022   BUN 15 10/04/2022   NA 136 10/04/2022   K 4.1 10/04/2022   CL 99 10/04/2022   CO2 28 10/04/2022    Lab Results  Component Value Date   ALT 30 (H) 09/11/2022   AST 29 09/11/2022   ALKPHOS 84 11/06/2016   BILITOT 0.4 09/11/2022        Jabier Mutton, [place] for Infectious Disease [facility] 10/08/2022, 3:44 PM

## 2022-10-08 NOTE — TOC Progression Note (Signed)
Transition of Care [facility]) - Progression Note    Patient Details  Name: Gabriella [name] MRN: [id number] Date of Birth: 1956/01/04  Transition of [facility]) CM/SW Contact  Pollie Friar, RN Phone Number: 10/08/2022, 11:54 AM  Clinical Narrative:    Plan is for home with IV antibiotics. CM met with the patient and she and spouse feel they are able to manage IV abx at home. She is already active with [facility]. CM has updated Amy with [facility] of the need for home IV abx. Pam with [facility] will provide education for administration to pt and spouse. [facility] will provide the antibiotics to the home. Pt will need PICC prior to d/c.  Pt states spouse already educated on dressing changes to her back.  TOC following.   Expected Discharge Plan: [place] Barriers to Discharge: Continued Medical Work up  Expected Discharge Plan and Services                                   HH Arranged: RN, PT, OT [facility] Agency: [place] Date [facility] Agency Contacted: 10/05/22   Representative spoke with at [place]: Amy   Social Determinants of Health ([place]) Interventions SDOH Screenings   Food Insecurity: No Food Insecurity (10/05/2022)  Housing: Low Risk  (10/05/2022)  Transportation Needs: No Transportation Needs (10/05/2022)  Utilities: Not At Risk (10/05/2022)  Depression (PHQ2-9): Low Risk  (09/17/2022)  Tobacco Use: High Risk (10/05/2022)    Readmission Risk Interventions    09/26/2022   12:23 PM  Readmission Risk Prevention Plan  Post Dischage Appt Complete  Medication Screening Complete  Transportation Screening Complete

## 2022-10-08 NOTE — Plan of Care (Signed)
  Problem: Activity: Goal: Ability to avoid complications of mobility impairment will improve Outcome: Progressing Goal: Ability to tolerate increased activity will improve Outcome: Progressing   Problem: Skin Integrity: Goal: Will show signs of wound healing Outcome: Progressing   Problem: Education: Goal: Knowledge of General Education information will improve Description: Including pain rating scale, medication(s)/side effects and non-pharmacologic comfort measures Outcome: Progressing   Problem: Health Behavior/Discharge Planning: Goal: Ability to manage health-related needs will improve Outcome: Progressing   Problem: Clinical Measurements: Goal: Respiratory complications will improve Outcome: Progressing   Problem: Skin Integrity: Goal: Risk for impaired skin integrity will decrease Outcome: Progressing

## 2022-10-08 NOTE — Progress Notes (Signed)
Subjective: The patient is alert and pleasant.  She is in no apparent distress.  Objective: Vital signs in last 24 hours: Temp:  [98.1 F (36.7 C)-98.6 F (37 C)] 98.3 F (36.8 C) (03/11 1104) Pulse Rate:  [77-91] 77 (03/11 1104) Resp:  [16-18] 18 (03/11 1104) BP: (128-163)/(61-91) 163/80 (03/11 1104) SpO2:  [95 %-99 %] 96 % (03/11 1104) Estimated body mass index is 35.28 kg/m as calculated from the following:   Height as of this encounter: '5\' 5"'$  (1.651 m).   Weight as of this encounter: 96.2 kg.   Intake/Output from previous day: 03/10 0701 - 03/11 0700 In: 988.8 [P.O.:240; I.V.:24.8; IV Piggyback:724] Out: 1900 [Urine:1900] Intake/Output this shift: Total I/O In: 240 [P.O.:240] Out: -   Physical exam the patient is alert and pleasant.  Her strength is normal.  Her dressing is clean and dry.  Lab Results: No results for input(s): "WBC", "HGB", "HCT", "PLT" in the last 72 hours. BMET No results for input(s): "NA", "K", "CL", "CO2", "GLUCOSE", "BUN", "CREATININE", "CALCIUM" in the last 72 hours.  Studies/Results: No results found.  Assessment/Plan: Status post incision and drainage of infected wound: The patient is doing well.  I appreciate IDs help.  We will arrange for home IV antibiotics and a PICC line.  LOS: 4 days     Gabriella [name] 10/08/2022, 11:44 AM     Patient ID: Gabriella [name], female   DOB: 1956/05/16, 67 y.o.   MRN: [id number]

## 2022-10-08 NOTE — Progress Notes (Signed)
Physical Therapy Treatment Patient Details Name: Gabriella [name] MRN: [id number] DOB: [birth year] Today's Date: 10/08/2022   History of Present Illness Patient is a 68 yo female presenting to the hospital on 10/04/22 for debridement of wound infection status post surgical decompression L2-L5 on 09/20/2022.  PMH includes OA, cancer, DM, HTN, TIA.    PT Comments    Pt making great progress towards her physical therapy goals, exhibiting improved activity tolerance and ambulation distance. Worked on TA contractions and bridging for core/glute involvement and activation. Pt ambulating 230 ft with a walker at a supervision level. Continues to report pain in low back, hips, glutes. D/c plan remains appropriate.    Recommendations for follow up therapy are one component of a multi-disciplinary discharge planning process, led by the attending physician.  Recommendations may be updated based on patient status, additional functional criteria and insurance authorization.  Follow Up Recommendations  Home health PT Can patient physically be transported by private vehicle: Yes   Assistance Recommended at Discharge Intermittent Supervision/Assistance  Patient can return home with the following A little help with walking and/or transfers;Assistance with cooking/housework;Assist for transportation;Help with stairs or ramp for entrance;A little help with bathing/dressing/bathroom   Equipment Recommendations  None recommended by PT    Recommendations for Other Services       Precautions / Restrictions Precautions Precautions: Fall;Back Restrictions Weight Bearing Restrictions: No     Mobility  Bed Mobility Overal bed mobility: Needs Assistance Bed Mobility: Rolling, Sidelying to Sit Rolling: Supervision Sidelying to sit: Supervision       General bed mobility comments: + use of bed rail, good log roll technique, increased time to elevate trunk    Transfers Overall transfer level: Needs  assistance Equipment used: Rolling walker (2 wheels) Transfers: Sit to/from Stand Sit to Stand: Supervision           General transfer comment: Increased time to rise    Ambulation/Gait Ambulation/Gait assistance: Supervision Gait Distance (Feet): 230 Feet Assistive device: Rolling walker (2 wheels) Gait Pattern/deviations: Step-through pattern, Decreased stride length, Trunk flexed Gait velocity: decreased     General Gait Details: Min cues for walker proximity, improved gait speed   Stairs             Wheelchair Mobility    Modified Rankin (Stroke Patients Only)       Balance Overall balance assessment: Needs assistance Sitting-balance support: Feet supported Sitting balance-Leahy Scale: Good     Standing balance support: Bilateral upper extremity supported, During functional activity, Reliant on assistive device for balance Standing balance-Leahy Scale: Poor Standing balance comment: Reliant on UE support                            Cognition Arousal/Alertness: Awake/alert Behavior During Therapy: WFL for tasks assessed/performed Overall Cognitive Status: Within Functional Limits for tasks assessed                                          Exercises General Exercises - Lower Extremity Long Arc Quad: Both, 5 reps, Seated Hip Flexion/Marching: Both, 5 reps, Seated Other Exercises Other Exercises: Supine: TA contractions 3 s hold x 5, bridges x 5    General Comments        Pertinent Vitals/Pain Pain Assessment Pain Assessment: Faces Faces Pain Scale: Hurts little more Pain Location: low back, hips, buttocks  Pain Descriptors / Indicators: Operative site guarding, Throbbing Pain Intervention(s): Monitored during session    Home Living                          Prior Function            PT Goals (current goals can now be found in the care plan section) Acute Rehab PT Goals Patient Stated Goal: to  improve Potential to Achieve Goals: Good Progress towards PT goals: Progressing toward goals    Frequency    Min 3X/week      PT Plan Current plan remains appropriate    Co-evaluation              AM-PAC PT "6 Clicks" Mobility   Outcome Measure  Help needed turning from your back to your side while in a flat bed without using bedrails?: A Little Help needed moving from lying on your back to sitting on the side of a flat bed without using bedrails?: A Little Help needed moving to and from a bed to a chair (including a wheelchair)?: A Little Help needed standing up from a chair using your arms (e.g., wheelchair or bedside chair)?: A Little Help needed to walk in hospital room?: A Little Help needed climbing 3-5 steps with a railing? : A Little 6 Click Score: 18    End of Session Equipment Utilized During Treatment: Gait belt Activity Tolerance: Patient tolerated treatment well Patient left: in chair;with call bell/phone within reach;with chair alarm set Nurse Communication: Mobility status PT Visit Diagnosis: Other abnormalities of gait and mobility (R26.89);Muscle weakness (generalized) (M62.81);Pain;Unsteadiness on feet (R26.81);Difficulty in walking, not elsewhere classified (R26.2) Pain - part of body:  (back, hips)     Time: [id number] PT Time Calculation (min) (ACUTE ONLY): 27 min  Charges:  $Gait Training: 8-22 mins $Therapeutic Activity: 8-22 mins                     Wyona Almas, PT, DPT [place] Office [phone]    Deno Etienne 10/08/2022, 10:52 AM

## 2022-10-08 NOTE — Progress Notes (Signed)
Occupational Therapy Treatment Patient Details Name: Gabriella [name] MRN: [id number] DOB: 1956/04/11 Today's Date: 10/08/2022   History of present illness Patient is a 67 yo female presenting to the hospital on 10/04/22 for debridement of wound infection status post surgical decompression L2-L5 on 09/20/2022.  PMH includes OA, cancer, DM, HTN, TIA.   OT comments  Patient continues to make steady progress towards goals in skilled OT session. Patient's session encompassed increasing activity tolerance and assessing overall cognition. Patient supervision for bed mobility and for transfers. Patient with much improved cognition and participation in ADLs, and able to complete ADLs at sink with set up and no need for seated rest breaks. OT recommendation remains appropriate, OT will continue to follow acutely.     Recommendations for follow up therapy are one component of a multi-disciplinary discharge planning process, led by the attending physician.  Recommendations may be updated based on patient status, additional functional criteria and insurance authorization.    Follow Up Recommendations  Home health OT     Assistance Recommended at Discharge Intermittent Supervision/Assistance  Patient can return home with the following  A little help with walking and/or transfers;A lot of help with bathing/dressing/bathroom;Assistance with cooking/housework;Direct supervision/assist for financial management;Direct supervision/assist for medications management;Assist for transportation;Help with stairs or ramp for entrance   Equipment Recommendations  None recommended by OT (Patient has DME needed)    Recommendations for Other Services      Precautions / Restrictions Precautions Precautions: Fall;Back Restrictions Weight Bearing Restrictions: No       Mobility Bed Mobility Overal bed mobility: Needs Assistance Bed Mobility: Rolling, Sidelying to Sit Rolling: Supervision Sidelying to sit:  Supervision       General bed mobility comments: + use of bed rail, good log roll technique, increased time to elevate trunk    Transfers Overall transfer level: Needs assistance Equipment used: Rolling walker (2 wheels) Transfers: Sit to/from Stand Sit to Stand: Supervision           General transfer comment: Increased time to rise     Balance Overall balance assessment: Needs assistance Sitting-balance support: Feet supported Sitting balance-Leahy Scale: Good     Standing balance support: Bilateral upper extremity supported, During functional activity, Reliant on assistive device for balance Standing balance-Leahy Scale: Poor Standing balance comment: Reliant on UE support                           ADL either performed or assessed with clinical judgement   ADL Overall ADL's : Needs assistance/impaired Eating/Feeding: Set up;Sitting   Grooming: Wash/dry hands;Wash/dry face;Oral care;Standing;Set up                   Toilet Transfer: Min guard;Rolling walker (2 wheels)           Functional mobility during ADLs: Minimal assistance;Cueing for sequencing;Cueing for safety;Rolling walker (2 wheels) General ADL Comments: Session focus on increasing activity tolerance and assessing overall cognition.    Extremity/Trunk Assessment              Vision       Perception     Praxis      Cognition Arousal/Alertness: Awake/alert Behavior During Therapy: WFL for tasks assessed/performed Overall Cognitive Status: Within Functional Limits for tasks assessed                                 General  Comments: much more alert and participatory, improved recall throughout        Exercises      Shoulder Instructions       General Comments      Pertinent Vitals/ Pain       Pain Assessment Pain Assessment: Faces Faces Pain Scale: Hurts little more Pain Location: low back, hips, buttocks Pain Descriptors / Indicators:  Operative site guarding, Throbbing Pain Intervention(s): Limited activity within patient's tolerance, Monitored during session, Premedicated before session, Repositioned  Home Living                                          Prior Functioning/Environment              Frequency  Min 2X/week        Progress Toward Goals  OT Goals(current goals can now be found in the care plan section)  Progress towards OT goals: Progressing toward goals  Acute Rehab OT Goals Patient Stated Goal: to go home OT Goal Formulation: With patient Time For Goal Achievement: 10/19/22 Potential to Achieve Goals: Good  Plan Discharge plan remains appropriate    Co-evaluation                 AM-PAC OT "6 Clicks" Daily Activity     Outcome Measure   Help from another person eating meals?: A Little Help from another person taking care of personal grooming?: A Little Help from another person toileting, which includes using toliet, bedpan, or urinal?: A Little Help from another person bathing (including washing, rinsing, drying)?: A Little Help from another person to put on and taking off regular upper body clothing?: A Little Help from another person to put on and taking off regular lower body clothing?: A Lot 6 Click Score: 17    End of Session Equipment Utilized During Treatment: Gait belt;Rolling walker (2 wheels)  OT Visit Diagnosis: Unsteadiness on feet (R26.81);Other abnormalities of gait and mobility (R26.89);Muscle weakness (generalized) (M62.81);Pain Pain - part of body:  (Back)   Activity Tolerance Patient tolerated treatment well   Patient Left in bed;with call bell/phone within reach;with nursing/sitter in room   Nurse Communication Mobility status        Time: [id number] OT Time Calculation (min): 17 min  Charges: OT General Charges $OT Visit: 1 Visit OT Treatments $Self Care/Home Management : 8-22 mins  Gabriella [name], OTR/L Acute  Rehabilitation Services [phone]   Gabriella [name] 10/08/2022, 12:22 PM

## 2022-10-08 NOTE — Progress Notes (Signed)
OT Cancellation Note  Patient Details Name: Gabriella [name] MRN: [id number] DOB: 16-Sep-1955   Cancelled Treatment:    Reason Eval/Treat Not Completed: Other (comment) Patient requesting pain medication prior to working with OT. OT will follow back as time permits.   Corinne Ports E. Kendric Sindelar, OTR/L Acute Rehabilitation Services [phone]   [name] 10/08/2022, 10:44 AM

## 2022-10-09 LAB — CULTURE, BLOOD (ROUTINE X 2)
Culture: NO GROWTH
Culture: NO GROWTH

## 2022-10-09 LAB — AEROBIC/ANAEROBIC CULTURE W GRAM STAIN (SURGICAL/DEEP WOUND)

## 2022-10-09 MED ORDER — ORAL CARE MOUTH RINSE: 15.0000 mL | OROMUCOSAL | Status: DC | PRN

## 2022-10-09 NOTE — Plan of Care (Signed)
  Problem: Education: Goal: Knowledge of General Education information will improve Description: Including pain rating scale, medication(s)/side effects and non-pharmacologic comfort measures Outcome: Progressing   Problem: Health Behavior/Discharge Planning: Goal: Ability to manage health-related needs will improve Outcome: Progressing   Problem: Clinical Measurements: Goal: Respiratory complications will improve Outcome: Progressing   Problem: Activity: Goal: Risk for activity intolerance will decrease Outcome: Progressing   Problem: Nutrition: Goal: Adequate nutrition will be maintained Outcome: Progressing   Problem: Coping: Goal: Level of anxiety will decrease Outcome: Progressing   Problem: Elimination: Goal: Will not experience complications related to bowel motility Outcome: Progressing   

## 2022-10-09 NOTE — Progress Notes (Signed)
Hand off report given patient ready to transfer

## 2022-10-09 NOTE — Progress Notes (Signed)
Physical Therapy Treatment Patient Details Name: Gabriella [name] MRN: [id number] DOB: 10-05-1955 Today's Date: 10/09/2022   History of Present Illness Patient is a 67 yo female presenting to the hospital on 10/04/22 for debridement of wound infection status post surgical decompression L2-L5 on 09/20/2022.  PMH includes OA, cancer, DM, HTN, TIA.    PT Comments    Pt received sitting EOB in twisted position. She is able to verbalize back precautions but does not always keep functionally. Pt ambulated 250' with RW and supervision. Practiced bkwd walking and increasing pace. Pt tends to leave RW behind to walk short distances but gait is more antalgic when she does this. Discussed the benefits of using RW in acute phase of healing and trying to improve gait pattern before she stops using it. PT will continue to follow.    Recommendations for follow up therapy are one component of a multi-disciplinary discharge planning process, led by the attending physician.  Recommendations may be updated based on patient status, additional functional criteria and insurance authorization.  Follow Up Recommendations  Home health PT Can patient physically be transported by private vehicle: Yes   Assistance Recommended at Discharge Intermittent Supervision/Assistance  Patient can return home with the following A little help with walking and/or transfers;Assistance with cooking/housework;Assist for transportation;Help with stairs or ramp for entrance;A little help with bathing/dressing/bathroom   Equipment Recommendations  None recommended by PT    Recommendations for Other Services       Precautions / Restrictions Precautions Precautions: Fall;Back Precaution Booklet Issued: No Precaution Comments: reviewed back precautions Required Braces or Orthoses:  (no brace needed orders with PA verbally confirming this 3/8 due to pt's wound, pt was using lumbar corset initially after PLIF) Restrictions Weight  Bearing Restrictions: No     Mobility  Bed Mobility               General bed mobility comments: pt received sitting EOB, had come to that position independently    Transfers Overall transfer level: Needs assistance Equipment used: Rolling walker (2 wheels) Transfers: Sit to/from Stand Sit to Stand: Supervision           General transfer comment: from bed, chair without arms, and toilet    Ambulation/Gait Ambulation/Gait assistance: Supervision Gait Distance (Feet): 250 Feet Assistive device: Rolling walker (2 wheels) Gait Pattern/deviations: Step-through pattern, Decreased stride length, Trunk flexed Gait velocity: decreased Gait velocity interpretation: 1.31 - 2.62 ft/sec, indicative of limited community ambulator   General Gait Details: Min cues for walker proximity and posture. Practiced bkwd walking. Tends to leave RW and walk without when going short distances. She relays that she does not use her RW in her home. Discussed safety issues with furniture walking as well as her gait being more symmetrical with use of RW   Stairs             Wheelchair Mobility    Modified Rankin (Stroke Patients Only)       Balance Overall balance assessment: Needs assistance Sitting-balance support: Feet supported Sitting balance-Leahy Scale: Good Sitting balance - Comments: pt able to don socks and shoes independently sitting in chair   Standing balance support: Bilateral upper extremity supported, During functional activity, Reliant on assistive device for balance Standing balance-Leahy Scale: Poor Standing balance comment: Reliant on UE support                            Cognition Arousal/Alertness: Awake/alert Behavior During  Therapy: WFL for tasks assessed/performed Overall Cognitive Status: Within Functional Limits for tasks assessed                                 General Comments: needs cueing for safety and precautions         Exercises      General Comments General comments (skin integrity, edema, etc.): discussed safety issues for home. Walked 100' without shoes and then went back to room and donned shoes for 150' more. Pt much more confortable with shoes on      Pertinent Vitals/Pain Pain Assessment Pain Assessment: Faces Faces Pain Scale: Hurts little more Pain Location: low back, hips, buttocks Pain Descriptors / Indicators: Operative site guarding, Throbbing Pain Intervention(s): Limited activity within patient's tolerance, Monitored during session    Home Living                          Prior Function            PT Goals (current goals can now be found in the care plan section) Acute Rehab PT Goals Patient Stated Goal: to improve PT Goal Formulation: With patient Time For Goal Achievement: 10/19/22 Potential to Achieve Goals: Good Progress towards PT goals: Progressing toward goals    Frequency    Min 3X/week      PT Plan Current plan remains appropriate    Co-evaluation              AM-PAC PT "6 Clicks" Mobility   Outcome Measure  Help needed turning from your back to your side while in a flat bed without using bedrails?: A Little Help needed moving from lying on your back to sitting on the side of a flat bed without using bedrails?: A Little Help needed moving to and from a bed to a chair (including a wheelchair)?: A Little Help needed standing up from a chair using your arms (e.g., wheelchair or bedside chair)?: A Little Help needed to walk in hospital room?: A Little Help needed climbing 3-5 steps with a railing? : A Little 6 Click Score: 18    End of Session Equipment Utilized During Treatment: Gait belt Activity Tolerance: Patient tolerated treatment well Patient left: in chair;with call bell/phone within reach;with chair alarm set Nurse Communication: Mobility status PT Visit Diagnosis: Other abnormalities of gait and mobility (R26.89);Muscle  weakness (generalized) (M62.81);Pain;Unsteadiness on feet (R26.81);Difficulty in walking, not elsewhere classified (R26.2) Pain - Right/Left: Right (both) Pain - part of body:  (back, hips)     Time: [id number] PT Time Calculation (min) (ACUTE ONLY): 33 min  Charges:  $Gait Training: 23-37 mins                     [place] chat preferred Office [place] 10/09/2022, 10:59 AM

## 2022-10-09 NOTE — Progress Notes (Signed)
Subjective: The patient is alert and pleasant.  She has no complaints.  Objective: Vital signs in last 24 hours: Temp:  [98.3 F (36.8 C)-98.8 F (37.1 C)] 98.8 F (37.1 C) (03/12 0718) Pulse Rate:  [67-87] 67 (03/12 0718) Resp:  [16-20] 17 (03/12 0340) BP: (131-168)/(65-80) 131/69 (03/12 0718) SpO2:  [94 %-99 %] 94 % (03/12 0718) Estimated body mass index is 35.28 kg/m as calculated from the following:   Height as of this encounter: '5\' 5"'$  (1.651 m).   Weight as of this encounter: 96.2 kg.   Intake/Output from previous day: 03/11 0701 - 03/12 0700 In: 390 [P.O.:390] Out: -  Intake/Output this shift: No intake/output data recorded.  Physical exam patient is alert and pleasant.  Her strength is normal.  Lab Results: No results for input(s): "WBC", "HGB", "HCT", "PLT" in the last 72 hours. BMET No results for input(s): "NA", "K", "CL", "CO2", "GLUCOSE", "BUN", "CREATININE", "CALCIUM" in the last 72 hours.  Studies/Results: No results found.  Assessment/Plan: Wound infection: The plan is to complete her IV antibiotics tomorrow morning and send her home on IV Cipro.  I appreciate Dr. Yetta Flock al's help.  LOS: 5 days     [name] 10/09/2022, 7:33 AM     Patient ID: Gabriella [name], female   DOB: 12/24/1955, 67 y.o.   MRN: [id number]

## 2022-10-09 NOTE — Progress Notes (Signed)
Wound care done, site cleansed with betadine, patient tolerated well.

## 2022-10-09 NOTE — Progress Notes (Signed)
Patient transferred to [facility] 3[facility] in wheelchair

## 2022-10-10 ENCOUNTER — Other Ambulatory Visit (HOSPITAL_COMMUNITY): Payer: Self-pay

## 2022-10-10 MED ORDER — CIPROFLOXACIN HCL 750 MG PO TABS
750.0000 mg | ORAL_TABLET | Freq: Two times a day (BID) | ORAL | 0 refills | Status: DC
  Filled 2022-10-10: qty 60, 30d supply, fill #0

## 2022-10-10 NOTE — Progress Notes (Signed)
Patient alert and oriented, voiding adequately, skin clean, dry and intact without evidence of skin break down, or symptoms of complications - no redness or edema noted, only slight tenderness at site.  Patient states pain is manageable at time of discharge. Patient has an appointment with MD in 2 weeks 

## 2022-10-10 NOTE — Discharge Summary (Signed)
Physician Discharge Summary  Patient ID: Gabriella [name] MRN: [id number] DOB/AGE: Dec 18, 1955 67 y.o.  Admit date: 10/04/2022 Discharge date: 10/10/2022  Admission Diagnoses: Lumbar wound infection  Discharge Diagnoses: The same Principal Problem:   Wound infection after surgery   Discharged Condition: good  Hospital Course: The patient was admitted on 10/04/2022 with a diagnosis of a lumbar wound infection.  She underwent an incision and drainage by Dr. Ellene Route.  Cultures grew Morganella.  The patient was given IV cefepime.  Infectious disease was consulted and recommended that he be treated with p.o. Cipro.  The remainder of the patient's hospital course was unremarkable.  She requested discharge to home.  The patient was given verbal and written discharge instructions.  She was instructed to follow-up with me next week for staple removal.  All her questions were answered.  Consults: PT, OT, care management, infectious disease Significant Diagnostic Studies: None Treatments: Incision and drainage of lumbar wound Discharge Exam: Blood pressure (!) 158/96, pulse 100, temperature 98.9 F (37.2 C), temperature source Oral, resp. rate 16, height '5\' 5"'$  (1.651 m), weight 96.2 kg, SpO2 99 %. The patient is alert and pleasant.  Her strength is normal.  She looks well.  Her wound is healing well without drainage.  Disposition: Home  Discharge Instructions     Call MD for:  difficulty breathing, headache or visual disturbances   Complete by: As directed    Call MD for:  extreme fatigue   Complete by: As directed    Call MD for:  hives   Complete by: As directed    Call MD for:  persistant dizziness or light-headedness   Complete by: As directed    Call MD for:  persistant nausea and vomiting   Complete by: As directed    Call MD for:  redness, tenderness, or signs of infection (pain, swelling, redness, odor or green/yellow discharge around incision site)   Complete by: As directed     Call MD for:  severe uncontrolled pain   Complete by: As directed    Call MD for:  temperature >100.4   Complete by: As directed    Diet - low sodium heart healthy   Complete by: As directed    Discharge instructions   Complete by: As directed    Call [phone] for a followup appointment. Take a stool softener while you are using pain medications.   Driving Restrictions   Complete by: As directed    Do not drive for 2 weeks.   Incentive spirometry RT   Complete by: As directed    Increase activity slowly   Complete by: As directed    Lifting restrictions   Complete by: As directed    Do not lift more than 5 pounds. No excessive bending or twisting.   May shower / Bathe   Complete by: As directed    Remove the dressing for 3 days after surgery.  You may shower, but leave the incision alone.   No dressing needed   Complete by: As directed       Allergies as of 10/10/2022   No Known Allergies      Medication List     STOP taking these medications    diazepam 5 MG tablet Commonly known as: VALIUM   HYDROcodone-acetaminophen 10-325 MG tablet Commonly known as: NORCO       TAKE these medications    allopurinol 300 MG tablet Commonly known as: ZYLOPRIM TAKE 1 TABLET BY MOUTH EVERY DAY  amLODipine 10 MG tablet Commonly known as: NORVASC TAKE 1 TABLET BY MOUTH EVERY DAY   aspirin EC 81 MG tablet Take 81 mg by mouth daily.   Cholecalciferol 100 MCG (4000 UT) Caps Take 4,000 Units by mouth daily.   ciprofloxacin 750 MG tablet Commonly known as: CIPRO Take 1 tablet (750 mg total) by mouth 2 (two) times daily.   cyclobenzaprine 5 MG tablet Commonly known as: FLEXERIL Take 5 mg by mouth 3 (three) times daily as needed for muscle spasms.   diclofenac Sodium 1 % Gel Commonly known as: VOLTAREN Apply 1 Application topically 4 (four) times daily as needed (pain).   Fish Oil 1000 MG Caps Take 2,000 mg by mouth daily.   furosemide 40 MG tablet Commonly  known as: LASIX TAKE 1 TABLET BY MOUTH EVERY DAY   gabapentin 400 MG capsule Commonly known as: NEURONTIN Take 800 mg by mouth See admin instructions. Take 800 mg daily, may take a second 800 mg dose as needed for pain   HYDROmorphone 4 MG tablet Commonly known as: DILAUDID Take 1/2 tablet (2 mg) by mouth every 4 hours as needed for post op pain control. What changed: Another medication with the same name was removed. Continue taking this medication, and follow the directions you see here.   Klor-Con M20 20 MEQ tablet Generic drug: potassium chloride SA TAKE 1 TABLET BY MOUTH 3 TIMES DAILY.   metoprolol succinate 50 MG 24 hr tablet Commonly known as: TOPROL-XL TAKE 1 TABLET BY MOUTH EVERY DAY WITH OR IMMEDIATELY FOLLOWING A MEAL   olmesartan 40 MG tablet Commonly known as: BENICAR TAKE 1 TABLET BY MOUTH EVERY DAY   ONE TOUCH ULTRA TEST test strip Generic drug: glucose blood TEST 2 TIMES A DAY   Contour Next Test test strip Generic drug: glucose blood USE AS INSTRUCTED, TO TEST TWICE A DAY   pantoprazole 40 MG tablet Commonly known as: PROTONIX TAKE 1 TABLET BY MOUTH EVERY DAY   simvastatin 40 MG tablet Commonly known as: ZOCOR TAKE 1 TABLET BY MOUTH EVERYDAY AT BEDTIME   SYSTANE OP Place 1 drop into both eyes daily as needed (dry eyes).   vitamin A 10000 UNIT capsule Take 10,000 Units by mouth daily.               Discharge Care Instructions  (From admission, onward)           Start     Ordered   10/10/22 0000  No dressing needed        10/10/22 1015            Follow-up Information     [facility] home [facility] Follow up.   Why: The home health agency will contact you for the next home visit. Contact information: [phone]                Signed: [name] 10/10/2022, 10:15 AM

## 2022-10-11 ENCOUNTER — Encounter: Payer: Self-pay | Admitting: Internal Medicine

## 2022-10-11 ENCOUNTER — Telehealth: Payer: Self-pay | Admitting: Internal Medicine

## 2022-10-11 ENCOUNTER — Telehealth (INDEPENDENT_AMBULATORY_CARE_PROVIDER_SITE_OTHER): Payer: Medicare Other | Admitting: Internal Medicine

## 2022-10-11 DIAGNOSIS — I10 Essential (primary) hypertension: Secondary | ICD-10-CM | POA: Diagnosis not present

## 2022-10-11 NOTE — Telephone Encounter (Signed)
[facility] [phone]  Marzetta Board called to say patient did not want to take her regular medications unless Dr Renold Genta said she should because she did not think she was taking them in the hospital. I ask what was on the discharge summary and it said to take regular medications.  I let Stacy know I would get with Dr Renold Genta and call patient back, however I was sure patient should continue medications ask prescribed.

## 2022-10-11 NOTE — Telephone Encounter (Addendum)
I connected with Gabriella [name].  Plato by telephone today regarding postoperative blood pressure management.  She is identified using 2 identifiers.  A home health individual is with her today and also has questions about her medications.  Patient is at her home and I am at my office.  She is agreeable to speaking with me in this format today.  Patient recently was discharged from [facility] after having lumbar spine surgery by Dr. Arnoldo Morale and subsequently developing a lumbar wound infection that required surgical intervention by Dr. Ellene Route.  She says her pain level is much better.  Since being home her blood pressure has been low.  She is not ambulating all that much and will be having home physical therapy.  She has been prescribed in the past, amlodipine, Lasix, metoprolol and olmesartan for hypertension.  At this point I think she can stay off of these medications.  She is denying lower extremity edema.  However I do think at some point her blood pressure will start to elevate again and when she becomes more mobile and active and we will likely have to restart at least some of these medications.  She is agreeable to monitoring her blood pressure at home and to contact me with questions or concerns and let me know within a week how her blood pressure is doing.  She may call the office with blood pressure readings.  Time spent with this call including chart review speaking on the phone with patient and medical decision making about her antihypertensive meds is 20 minutes.  MJB, MD

## 2022-11-18 ENCOUNTER — Other Ambulatory Visit: Payer: Self-pay | Admitting: Internal Medicine

## 2023-01-02 LAB — HM DIABETES EYE EXAM

## 2023-01-11 ENCOUNTER — Encounter: Payer: Self-pay | Admitting: Internal Medicine

## 2023-02-23 ENCOUNTER — Other Ambulatory Visit: Payer: Self-pay | Admitting: Internal Medicine

## 2023-03-06 ENCOUNTER — Other Ambulatory Visit: Payer: Self-pay | Admitting: Internal Medicine

## 2023-03-14 NOTE — Progress Notes (Signed)
Patient Care Team: Margaree Mackintosh, MD as PCP - General  Visit Date: 03/21/23  Subjective:    Patient ID: Gabriella [name] , Female   DOB: [birth year], 67 y.o.    MRN: [id number]   67 y.o. Female presents today for a 6 month follow-up. History of dependent edema, Type 2 diabetes mellitus, hypertension, hyperlipidemia, hypokalemia, metrorrhagia, obesity.  History of hypertension treated with amlodipine 10 mg daily, metoprolol succinate 50 mg daily with or immediately following a meal, olmesartan 40 mg daily. Blood pressure at 130/88.  History of GERD treated with pantoprazole 40 mg daily.  History of hyperlipidemia treated with simvastatin 40 mg daily at bedtime. HDL low at 47 on 03/19/23, up from 37 on 09/11/22. LDL slightly elevated at 107.  History of dependent edema treated with furosemide 40 mg daily.  History of gout treated with allopurinol 300 mg daily.  Status post lumbar wound debridement on 10/04/22 with Dr. Danielle Dess. She has had constipation since. She is using Miralax 1 rounded teaspoon daily.  History of nerve pain treated with Neurontin 800 mg twice daily as needed.  History of muscle spasms treated with Flexeril 5 mg three times daily as needed.  History of Type 2 diabetes mellitus. Not currently taking medication. HGBA1c at 6.1% on 03/19/23, up from 6.0% on 09/11/22.  ALT elevated at 36. TSH at 1.34.  She smoked one pack of cigarettes a day for 50 years.  Past Medical History:  Diagnosis Date   Arthritis    Cancer (HCC)    FACIAL CANCER    Dependent edema    Diabetes mellitus    pt reports she was pre-DM in the past but lost weight and is no longer DM   Headache    Hypertension    Hypertriglyceridemia    Hypokalemia    Metrorrhagia    Obesity    Peptic ulcer disease    PONV (postoperative nausea and vomiting)    SUI (stress urinary incontinence, female)    TIA (transient ischemic attack)    "I might've had a TIA in summer 2022"   Tobacco abuse       Family History  Problem Relation Age of Onset   Hypertension Mother    Dementia Mother    Lung cancer Father    Breast cancer Neg Hx     Social History   Social History Narrative   Social history: This is her second marriage.  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   Continues to smoke and has smoked for well over 30 years.  No alcohol consumption. Right-handed.   2 cups caffeine daily.       Family history: Mother died with complications of dementia with history of severe hypertension.  Father deceased with lung cancer.  Time of 5 brothers.  2 brothers have heart murmurs.          Review of Systems  Constitutional:  Negative for fever and malaise/fatigue.  HENT:  Negative for congestion.   Eyes:  Negative for blurred vision.  Respiratory:  Negative for cough and shortness of breath.   Cardiovascular:  Negative for chest pain, palpitations and leg swelling.  Gastrointestinal:  Positive for constipation. Negative for vomiting.  Musculoskeletal:  Negative for back pain.  Skin:  Negative for rash.  Neurological:  Negative for loss of consciousness and headaches.        Objective:   Vitals: BP 130/88   Pulse 89  Ht 5\' 3"  (1.6 m)   Wt 219 lb (99.3 kg)   SpO2 96%   BMI 38.79 kg/m    Physical Exam Vitals and nursing note reviewed.  Constitutional:      General: She is not in acute distress.    Appearance: Normal appearance. She is not toxic-appearing.  HENT:     Head: Normocephalic and atraumatic.  Neck:     Thyroid: No thyroid mass, thyromegaly or thyroid tenderness.     Vascular: No carotid bruit.  Cardiovascular:     Rate and Rhythm: Normal rate and regular rhythm. No extrasystoles are present.    Pulses:          Dorsalis pedis pulses are 1+ on the right side and 1+ on the left side.       Posterior tibial pulses are 0 on the right side and 0 on the left side.     Heart sounds: Murmur heard.     Systolic  murmur is present with a grade of 1/6.     No friction rub. No gallop.  Pulmonary:     Effort: Pulmonary effort is normal. No respiratory distress.     Breath sounds: Normal breath sounds. No wheezing or rales.  Musculoskeletal:     Right lower leg: 1+ Edema present.     Left lower leg: 1+ Edema present.  Lymphadenopathy:     Cervical: No cervical adenopathy.  Skin:    General: Skin is warm and dry.  Neurological:     Mental Status: She is alert and oriented to person, place, and time. Mental status is at baseline.  Psychiatric:        Mood and Affect: Mood normal.        Behavior: Behavior normal.        Thought Content: Thought content normal.        Judgment: Judgment normal.      Results:   Studies obtained and personally reviewed by me:   Labs:       Component Value Date/Time   NA 136 10/04/2022 1450   K 4.1 10/04/2022 1450   CL 99 10/04/2022 1450   CO2 28 10/04/2022 1450   GLUCOSE 112 (H) 10/04/2022 1450   BUN 15 10/04/2022 1450   CREATININE 0.77 10/04/2022 1912   CREATININE 0.59 09/11/2022 1007   CALCIUM 8.7 (L) 10/04/2022 1450   PROT 6.6 03/19/2023 0947   ALBUMIN 4.5 11/06/2016 0942   AST 28 03/19/2023 0947   ALT 36 (H) 03/19/2023 0947   ALKPHOS 84 11/06/2016 0942   BILITOT 0.3 03/19/2023 0947   GFRNONAA >60 10/04/2022 1912   GFRNONAA 100 08/30/2020 1235   GFRAA 116 08/30/2020 1235     Lab Results  Component Value Date   WBC 8.7 10/04/2022   HGB 9.8 (L) 10/04/2022   HCT 30.0 (L) 10/04/2022   MCV 101.7 (H) 10/04/2022   PLT 508 (H) 10/04/2022    Lab Results  Component Value Date   CHOL 175 03/19/2023   HDL 47 (L) 03/19/2023   LDLCALC 107 (H) 03/19/2023   TRIG 117 03/19/2023   CHOLHDL 3.7 03/19/2023    Lab Results  Component Value Date   HGBA1C 6.1 (H) 03/19/2023     Lab Results  Component Value Date   TSH 1.34 03/19/2023      Assessment & Plan:   Constipation: start Miralax two teaspoons daily. Seems to be associated with pain  medication she started after surgery.  Hypertension: treated with amlodipine  10 mg daily, metoprolol succinate 50 mg daily with or immediately following a meal, olmesartan 40 mg daily. Blood pressure at 130/88.  GERD: treated with pantoprazole 40 mg daily.  Hyperlipidemia: treated with simvastatin 40 mg daily at bedtime. HDL low at 47 on 03/19/23, up from 37 on 09/11/22. LDL slightly elevated at 107.  Dependent Edema: treated with furosemide 40 mg daily.  Gout: treated with allopurinol 300 mg daily.  Nerve pain: treated with Neurontin 800 mg twice daily as needed.  Muscle spasms: treated with Flexeril 5 mg three times daily as needed.  Type 2 diabetes mellitus: HGBA1c at 6.1% on 03/19/23, up from 6.0% on 09/11/22. She will work on controlling with healthy diet and exercise.  Hx of smoking- order CT lung cancer screening  Ordered CT lung cancer screening, bone density scan.  Return in 6 months or as needed.    I,Alexander Ruley,acting as a Neurosurgeon for Margaree Mackintosh, MD.,have documented all relevant documentation on the behalf of Margaree Mackintosh, MD,as directed by  Margaree Mackintosh, MD while in the presence of Margaree Mackintosh, MD.   I, Margaree Mackintosh, MD, have reviewed all documentation for this visit. The documentation on 03/31/23 for the exam, diagnosis, procedures, and orders are all accurate and complete.

## 2023-03-19 ENCOUNTER — Other Ambulatory Visit: Payer: Medicare Other

## 2023-03-19 DIAGNOSIS — E8881 Metabolic syndrome: Secondary | ICD-10-CM

## 2023-03-19 DIAGNOSIS — E781 Pure hyperglyceridemia: Secondary | ICD-10-CM

## 2023-03-19 DIAGNOSIS — E785 Hyperlipidemia, unspecified: Secondary | ICD-10-CM

## 2023-03-19 DIAGNOSIS — R7302 Impaired glucose tolerance (oral): Secondary | ICD-10-CM

## 2023-03-19 DIAGNOSIS — I10 Essential (primary) hypertension: Secondary | ICD-10-CM

## 2023-03-19 DIAGNOSIS — E786 Lipoprotein deficiency: Secondary | ICD-10-CM

## 2023-03-20 LAB — TSH: TSH: 1.34 m[IU]/L (ref 0.40–4.50)

## 2023-03-20 LAB — HEPATIC FUNCTION PANEL
AG Ratio: 1.5 (calc) (ref 1.0–2.5)
ALT: 36 U/L — ABNORMAL HIGH (ref 6–29)
AST: 28 U/L (ref 10–35)
Albumin: 4 g/dL (ref 3.6–5.1)
Alkaline phosphatase (APISO): 90 U/L (ref 37–153)
Bilirubin, Direct: 0.1 mg/dL (ref 0.0–0.2)
Globulin: 2.6 g/dL (ref 1.9–3.7)
Indirect Bilirubin: 0.2 mg/dL (ref 0.2–1.2)
Total Bilirubin: 0.3 mg/dL (ref 0.2–1.2)
Total Protein: 6.6 g/dL (ref 6.1–8.1)

## 2023-03-20 LAB — LIPID PANEL
Cholesterol: 175 mg/dL (ref <200)
HDL: 47 mg/dL — ABNORMAL LOW (ref >=50)
LDL Cholesterol (Calc): 107 mg/dL — ABNORMAL HIGH
Non-HDL Cholesterol (Calc): 128 mg/dL (calc) (ref <130)
Total CHOL/HDL Ratio: 3.7 (calc) (ref <5.0)
Triglycerides: 117 mg/dL (ref <150)

## 2023-03-20 LAB — HEMOGLOBIN A1C
Hgb A1c MFr Bld: 6.1 %{Hb} — ABNORMAL HIGH (ref <5.7)
Mean Plasma Glucose: 128 mg/dL
eAG (mmol/L): 7.1 mmol/L

## 2023-03-21 ENCOUNTER — Ambulatory Visit: Payer: Medicare Other | Admitting: Internal Medicine

## 2023-03-21 ENCOUNTER — Encounter: Payer: Self-pay | Admitting: Internal Medicine

## 2023-03-21 VITALS — BP 130/88 | HR 89 | Ht 63.0 in | Wt 219.0 lb

## 2023-03-21 DIAGNOSIS — E119 Type 2 diabetes mellitus without complications: Secondary | ICD-10-CM

## 2023-03-21 DIAGNOSIS — Z0000 Encounter for general adult medical examination without abnormal findings: Secondary | ICD-10-CM

## 2023-03-21 DIAGNOSIS — Z8711 Personal history of peptic ulcer disease: Secondary | ICD-10-CM

## 2023-03-21 DIAGNOSIS — Z8659 Personal history of other mental and behavioral disorders: Secondary | ICD-10-CM

## 2023-03-21 DIAGNOSIS — E781 Pure hyperglyceridemia: Secondary | ICD-10-CM

## 2023-03-21 DIAGNOSIS — E786 Lipoprotein deficiency: Secondary | ICD-10-CM

## 2023-03-21 DIAGNOSIS — F17200 Nicotine dependence, unspecified, uncomplicated: Secondary | ICD-10-CM

## 2023-03-21 DIAGNOSIS — E8881 Metabolic syndrome: Secondary | ICD-10-CM

## 2023-03-21 DIAGNOSIS — Z8739 Personal history of other diseases of the musculoskeletal system and connective tissue: Secondary | ICD-10-CM

## 2023-03-21 DIAGNOSIS — K219 Gastro-esophageal reflux disease without esophagitis: Secondary | ICD-10-CM | POA: Diagnosis not present

## 2023-03-21 DIAGNOSIS — I10 Essential (primary) hypertension: Secondary | ICD-10-CM

## 2023-03-21 DIAGNOSIS — N951 Menopausal and female climacteric states: Secondary | ICD-10-CM

## 2023-03-21 DIAGNOSIS — Z6838 Body mass index (BMI) 38.0-38.9, adult: Secondary | ICD-10-CM

## 2023-03-21 DIAGNOSIS — M7918 Myalgia, other site: Secondary | ICD-10-CM

## 2023-03-21 DIAGNOSIS — F411 Generalized anxiety disorder: Secondary | ICD-10-CM

## 2023-03-21 DIAGNOSIS — Z87891 Personal history of nicotine dependence: Secondary | ICD-10-CM

## 2023-03-21 DIAGNOSIS — G8929 Other chronic pain: Secondary | ICD-10-CM

## 2023-03-28 ENCOUNTER — Other Ambulatory Visit: Payer: Self-pay | Admitting: Internal Medicine

## 2023-03-31 NOTE — Patient Instructions (Addendum)
It was a pleasure to see you today. Have ordered bone density study and lung cancer screening CT with prior hx of smoking. Return in 6 months or as needed. Watch diet a bit. Try to walk for exercise. Monitor BP at home.

## 2023-04-02 ENCOUNTER — Other Ambulatory Visit: Payer: Self-pay | Admitting: Internal Medicine

## 2023-04-02 MED ORDER — ACCU-CHEK GUIDE VI STRP: ORAL_STRIP | 12 refills | Status: DC

## 2023-04-02 MED ORDER — CONTOUR TEST VI STRP: ORAL_STRIP | 25 refills | Status: DC

## 2023-04-02 NOTE — Addendum Note (Signed)
Addended by: Gregery Na on: 04/02/2023 03:16 PM   Modules accepted: Orders

## 2023-04-02 NOTE — Addendum Note (Signed)
Addended by: Gregery Na on: 04/02/2023 05:05 PM   Modules accepted: Orders

## 2023-04-17 LAB — FECAL OCCULT BLOOD, GUAIAC: Fecal Occult Blood: NEGATIVE

## 2023-04-23 ENCOUNTER — Encounter: Payer: Self-pay | Admitting: Internal Medicine

## 2023-05-05 ENCOUNTER — Other Ambulatory Visit: Payer: Self-pay | Admitting: Internal Medicine

## 2023-05-06 NOTE — Telephone Encounter (Signed)
She said she takes one a day sometimes 2, she said after her surgery she is having numbness in her back going down to her right leg.

## 2023-06-28 ENCOUNTER — Ambulatory Visit (HOSPITAL_COMMUNITY)
Admission: RE | Admit: 2023-06-28 | Discharge: 2023-06-28 | Disposition: A | Discharge status: Home / Self Care | Payer: Medicare Other | Source: Ambulatory Visit | Attending: Internal Medicine | Admitting: Internal Medicine

## 2023-06-28 DIAGNOSIS — Z87891 Personal history of nicotine dependence: Secondary | ICD-10-CM | POA: Insufficient documentation

## 2023-07-05 ENCOUNTER — Other Ambulatory Visit: Payer: Self-pay | Admitting: Internal Medicine

## 2023-08-23 ENCOUNTER — Other Ambulatory Visit: Payer: Self-pay | Admitting: Internal Medicine

## 2023-08-28 ENCOUNTER — Other Ambulatory Visit: Payer: Self-pay | Admitting: Internal Medicine

## 2023-09-12 ENCOUNTER — Other Ambulatory Visit: Payer: Medicare Other

## 2023-09-12 DIAGNOSIS — F17200 Nicotine dependence, unspecified, uncomplicated: Secondary | ICD-10-CM

## 2023-09-12 DIAGNOSIS — G8929 Other chronic pain: Secondary | ICD-10-CM

## 2023-09-12 DIAGNOSIS — Z0000 Encounter for general adult medical examination without abnormal findings: Secondary | ICD-10-CM

## 2023-09-12 DIAGNOSIS — I10 Essential (primary) hypertension: Secondary | ICD-10-CM

## 2023-09-12 DIAGNOSIS — K580 Irritable bowel syndrome with diarrhea: Secondary | ICD-10-CM

## 2023-09-12 DIAGNOSIS — E8881 Metabolic syndrome: Secondary | ICD-10-CM

## 2023-09-12 DIAGNOSIS — E781 Pure hyperglyceridemia: Secondary | ICD-10-CM

## 2023-09-12 DIAGNOSIS — E119 Type 2 diabetes mellitus without complications: Secondary | ICD-10-CM

## 2023-09-12 DIAGNOSIS — E786 Lipoprotein deficiency: Secondary | ICD-10-CM

## 2023-09-12 DIAGNOSIS — E785 Hyperlipidemia, unspecified: Secondary | ICD-10-CM

## 2023-09-13 ENCOUNTER — Encounter: Payer: Self-pay | Admitting: Internal Medicine

## 2023-09-13 LAB — MICROALBUMIN / CREATININE URINE RATIO
Creatinine, Urine: 35 mg/dL (ref 20–275)
Microalb Creat Ratio: 229 mg/g{creat} — ABNORMAL HIGH (ref <30)
Microalb, Ur: 8 mg/dL

## 2023-09-13 LAB — CBC WITH DIFFERENTIAL/PLATELET
Absolute Lymphocytes: 1733 {cells}/uL (ref 850–3900)
Absolute Monocytes: 491 {cells}/uL (ref 200–950)
Basophils Absolute: 38 {cells}/uL (ref 0–200)
Basophils Relative: 0.7 %
Eosinophils Absolute: 103 {cells}/uL (ref 15–500)
Eosinophils Relative: 1.9 %
HCT: 43.5 % (ref 35.0–45.0)
Hemoglobin: 14.7 g/dL (ref 11.7–15.5)
MCH: 33.1 pg — ABNORMAL HIGH (ref 27.0–33.0)
MCHC: 33.8 g/dL (ref 32.0–36.0)
MCV: 98 fL (ref 80.0–100.0)
MPV: 9.6 fL (ref 7.5–12.5)
Monocytes Relative: 9.1 %
Neutro Abs: 3035 {cells}/uL (ref 1500–7800)
Neutrophils Relative %: 56.2 %
Platelets: 287 10*3/uL (ref 140–400)
RBC: 4.44 10*6/uL (ref 3.80–5.10)
RDW: 12.3 % (ref 11.0–15.0)
Total Lymphocyte: 32.1 %
WBC: 5.4 10*3/uL (ref 3.8–10.8)

## 2023-09-13 LAB — COMPLETE METABOLIC PANEL WITH GFR
AG Ratio: 1.5 (calc) (ref 1.0–2.5)
ALT: 35 U/L — ABNORMAL HIGH (ref 6–29)
AST: 28 U/L (ref 10–35)
Albumin: 4.2 g/dL (ref 3.6–5.1)
Alkaline phosphatase (APISO): 74 U/L (ref 37–153)
BUN: 20 mg/dL (ref 7–25)
CO2: 21 mmol/L (ref 20–32)
Calcium: 9.3 mg/dL (ref 8.6–10.4)
Chloride: 104 mmol/L (ref 98–110)
Creat: 0.61 mg/dL (ref 0.50–1.05)
Globulin: 2.8 g/dL (ref 1.9–3.7)
Glucose, Bld: 112 mg/dL — ABNORMAL HIGH (ref 65–99)
Potassium: 4.7 mmol/L (ref 3.5–5.3)
Sodium: 142 mmol/L (ref 135–146)
Total Bilirubin: 0.4 mg/dL (ref 0.2–1.2)
Total Protein: 7 g/dL (ref 6.1–8.1)
eGFR: 99 mL/min/{1.73_m2} (ref >=60)

## 2023-09-13 LAB — LIPID PANEL
Cholesterol: 151 mg/dL (ref <200)
HDL: 47 mg/dL — ABNORMAL LOW (ref >=50)
LDL Cholesterol (Calc): 78 mg/dL
Non-HDL Cholesterol (Calc): 104 mg/dL (ref <130)
Total CHOL/HDL Ratio: 3.2 (calc) (ref <5.0)
Triglycerides: 160 mg/dL — ABNORMAL HIGH (ref <150)

## 2023-09-13 LAB — HEMOGLOBIN A1C
Hgb A1c MFr Bld: 6.3 %{Hb} — ABNORMAL HIGH (ref <5.7)
Mean Plasma Glucose: 134 mg/dL
eAG (mmol/L): 7.4 mmol/L

## 2023-09-13 LAB — TSH: TSH: 1.46 m[IU]/L (ref 0.40–4.50)

## 2023-09-13 NOTE — Progress Notes (Signed)
 Annual Medicare Wellness Visit   Patient Care Team: Margaree Mackintosh, MD as PCP - General  Visit Date: 09/16/23   Chief Complaint  Patient presents with   Medicare Wellness   Annual Exam   Subjective:  Patient: Gabriella [name], Female DOB: 1956/04/02, 68 y.o. MRN: [id number] Gabriella [name] is a 68 y.o. Female who presents today for her Annual Medicare Wellness Visit. Patient has history of Dependent Edema, type 2 Diabetes Mellitus, Hypertension, Hyperlipidemia, Hypokalemia, Metrorrhagia, Obesity.  She reports that her back down right leg numbness has improved, onset following her surgical decompression of her lumbar spine on 09/20/2022, which did become infected and required hospitalization on 10/04/2022 for surgical debridement.  Notes she does have some issues with constipation, especially now that she is s/p PILF L2-3, L3-4, L4-5. States she does use Miralax daily, but does still have issues with straining.   History of Smoking 1 PPD x50 years with Fhx of Lung Cancer (Father) quit smoking last year following her spinal procedure. Lung Cancer Screening 06/28/2023 with aortic atherosclerosis; coronary artery calcification, emphysema, and negative result for cancer. Repeat screening recommended for 05/2024.   States she had a hearing test 2 weeks ago, which she reports results noting a slight decrease in hearing in her (?)left ear.   History of Hypertension treated with Amlodipine 10 mg daily, Metoprolol succinate 50 mg daily with or immediately following a meal, and Olmesartan 40 mg daily.Blood Pressure: normotensive today at 110/80. Associated Dependent Edema treated with Lasix 40 mg daily. Associated Hypokalemia treated with KLOR-CON M20 20 MEQ 3x daily.   History of GERD treated with Pantoprazole 40 mg daily.  History of Hyperlipidemia treated with Simvastatin 40 mg daily at bedtime. 09/12/2023 Lipid Panel, compared to 03/19/23: HDL 47, no change; TRIG 160, elevated from 117;  otherwise WNL.   History of Diabetes Mellitus, type II currently no managed with medication. 09/12/2023 HgbA1c, compared to 03/19/23: 6.3, elevated from 6.1; Blood Glucose 112, no change from 09/2022.   History of Gout treated with Allopurinol 300 mg daily.  History of Nerve Pain treated with Neurontin 800 mg twice daily as needed.  History of Muscle Spasms treated with 5 mg Flexeril as needed; Back Pain treated with 5 mg Valium as needed - also has been used to treat her anxiety.   Addressed Elevated Liver Function 09/12/2023 ALT 35, decreased from 36 on 03/19/23 but still elevated. She questioned whether elevation could be attributed some of her medications, endorses daily Hydrocodone-Acetaminophen usage to manage her chronic musculoskeletal pain. Denies alcohol consumption.  Could be attributed to some of her medications. Denies alcohol consumption.   Labs 09/12/2023 CBC, compared to 10/04/22: MCH 33.1, decreased from 33.2 but elevated above current normal limits.  CMP, compared to 3/7 & 03/19/2023: Blood Glucose 112, no change from 09/2022; ALT 35, decreased from 36 but still elevated; otherwise WNL.  Microalbumin/Creatinine: Microalb/Creat Ratio 229; otherwise WNL TSH: 1.46  Overdue PAP Smear since 2024. Last completed 06/09/20 with results negative for intraepithelial lesion or malignancy.   Mammogram 08/01/2022 normal with repeat recommendation of 2025.   Colonoscopy never done, has been discussed previously. Reports having done a colo-guard, which was negative according to her though there is no record in EPIC of this being ordered or any results.   Bone Density 11/16/2009 T-score Left Femur Neck -0.4, normal. Repeat ordered 02/2023 has not been completed yet.   Vaccine Counseling: Due for Flu and Shingles 1/2 - updated Flu today; UTD on PNA and  Tdap. Past Medical History:  Diagnosis Date   Arthritis    Cancer (HCC)    FACIAL CANCER    Dependent edema    Diabetes mellitus    pt reports  she was pre-DM in the past but lost weight and is no longer DM   Headache    Hypertension    Hypertriglyceridemia    Hypokalemia    Metrorrhagia    Obesity    Peptic ulcer disease    PONV (postoperative nausea and vomiting)    SUI (stress urinary incontinence, female)    TIA (transient ischemic attack)    "I might've had a TIA in summer 2022"  Medical/Surgical History Narrative:  Saw Dr. Tressie Stalker at [facility] in November 2022 regarding spondylolisthesis of lumbar spine and cervical spondylosis with myelopathy and radiculopathy. She underwent C4-C5 and C5-C6 anterior cervical discectomy, fusion and plating July 05, 2021.    In December 2017 had right reverse shoulder arthroplasty by Dr. Rennis Chris.  She saw Dr. Zachery Dakins at [facility] regarding chronic left ankle pain and foot a number of years ago.  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 Surgery was not advised.  She was hospitalized for perforated duodenal ulcer requiring surgery in 2008.  History of debridement of anterior cruciate ligament right knee at [facility]. Family History  Problem Relation Age of Onset   Hypertension Mother    Dementia Mother    Lung cancer Father    Breast cancer Neg Hx    Social History   Social History Narrative   Social history: This is her second marriage.  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   Hx of Smoking 1 PPD x50 yrs.  No alcohol consumption. Right-handed.   2 cups caffeine daily.       Family history: Mother died with complications of dementia with history of severe hypertension.  Father deceased with lung cancer.  Time of 5 brothers.  2 brothers have heart murmurs.      Walking dog daily Review of Systems  HENT:  Positive for hearing loss (left(?) ear).    Gastrointestinal:  Positive for constipation.  Neurological:        (+) Numbness - Back & Right Leg    Objective:  Vitals: BP 110/80   Pulse 68   Ht 5\' 3"  (1.6 m)   Wt 225 lb (102.1 kg)   SpO2 96%   BMI 39.86 kg/m  Physical Exam Vitals and nursing note reviewed.  Constitutional:      General: She is not in acute distress.    Appearance: Normal appearance. She is not ill-appearing or toxic-appearing.  HENT:     Head: Normocephalic and atraumatic.     Right Ear: Hearing, tympanic membrane, ear canal and external ear normal.     Left Ear: Hearing, tympanic membrane, ear canal and external ear normal.     Mouth/Throat:     Pharynx: Oropharynx is clear.  Eyes:     Extraocular Movements: Extraocular movements intact.     Pupils: Pupils are equal, round, and reactive to light.  Neck:     Thyroid: No thyroid mass, thyromegaly or thyroid tenderness.     Vascular: No carotid bruit.  Cardiovascular:     Rate and Rhythm: Normal rate and regular rhythm. No extrasystoles are present.  Pulses:          Dorsalis pedis pulses are 1+ on the right side and 1+ on the left side.       Posterior tibial pulses are 0 on the right side and 0 on the left side.     Heart sounds: Normal heart sounds. No murmur heard.    No friction rub. No gallop.  Pulmonary:     Effort: Pulmonary effort is normal.     Breath sounds: Normal breath sounds. No decreased breath sounds, wheezing, rhonchi or rales.  Chest:     Chest wall: No mass.  Abdominal:     Palpations: Abdomen is soft. There is no hepatomegaly, splenomegaly or mass.     Tenderness: There is no abdominal tenderness.     Hernia: No hernia is present.  Genitourinary:    Adnexa: Right adnexa normal and left adnexa normal.     Rectum: Guaiac result negative.     Comments: Bimanual normal Musculoskeletal:     Cervical back: Normal range of motion.     Right lower leg: No edema.     Left lower leg: No edema.  Lymphadenopathy:     Cervical: No  cervical adenopathy.     Upper Body:     Right upper body: No supraclavicular adenopathy.     Left upper body: No supraclavicular adenopathy.  Skin:    General: Skin is warm and dry.  Neurological:     General: No focal deficit present.     Mental Status: She is alert and oriented to person, place, and time. Mental status is at baseline.     Sensory: Sensation is intact.     Motor: Motor function is intact. No weakness.     Deep Tendon Reflexes: Reflexes are normal and symmetric.  Psychiatric:        Attention and Perception: Attention normal.        Mood and Affect: Mood normal.        Speech: Speech normal.        Behavior: Behavior normal.        Thought Content: Thought content normal.        Cognition and Memory: Cognition normal.        Judgment: Judgment normal.   Most Recent Functional Status Assessment:    09/16/2023   11:18 AM  In your present state of health, do you have any difficulty performing the following activities:  Hearing? 0  Vision? 0  Difficulty concentrating or making decisions? 0  Walking or climbing stairs? 1  Dressing or bathing? 0  Doing errands, shopping? 0  Preparing Food and eating ? N  Using the Toilet? N  In the past six months, have you accidently leaked urine? N  Do you have problems with loss of bowel control? N  Managing your Medications? N  Managing your Finances? N  Housekeeping or managing your Housekeeping? N   Most Recent Fall Risk Assessment:    09/16/2023   11:19 AM  Fall Risk   Falls in the past year? 0  Number falls in past yr: 0  Injury with Fall? 0  Risk for fall due to : No Fall Risks  Follow up Falls prevention discussed;Education provided;Falls evaluation completed   Most Recent Depression Screenings:    09/16/2023   11:21 AM 09/17/2022    1:06 PM  PHQ 2/9 Scores  PHQ - 2 Score 0 0   Most Recent Cognitive Screening:    09/16/2023  11:21 AM  6CIT Screen  What Year? 0 points  What month? 0 points  What time?  0 points  Count back from 20 0 points  Months in reverse 0 points  Repeat phrase 0 points  Total Score 0 points   Results:  Studies Obtained And Personally Reviewed By Me:  PAP Smear 06/09/20 with results negative for intraepithelial lesion or malignancy.   Mammogram 08/01/2022 normal.   Colo-guard negative according to her though there is no record in EPIC of this being ordered or any results.   Bone Density 11/16/2009 T-score Left Femur Neck -0.4, normal.   CT CHEST WITHOUT CONTRAST LOW-DOSE FOR LUNG CANCER SCREENING 06/28/2023  FINDINGS: Cardiovascular: Atherosclerotic calcification of the aorta, aortic valve and coronary arteries. Heart is at the upper limits of normal in size to mildly enlarged. No pericardial effusion.   Mediastinum/Nodes: No pathologically enlarged mediastinal or axillary lymph nodes. Hilar regions are difficult to definitively evaluate without IV contrast. Esophagus is grossly unremarkable.   Lungs/Pleura: Centrilobular emphysema. Smoking related respiratory bronchiolitis. No suspicious pulmonary nodules. No pleural fluid. Airway is unremarkable.   Upper Abdomen: Visualized portions of the liver, gallbladder, adrenal glands, kidneys, spleen, pancreas, stomach and bowel are grossly unremarkable. No upper abdominal adenopathy.   Musculoskeletal: Degenerative changes in the spine.   IMPRESSION: 1. Lung-RADS 1, negative. Continue annual screening with low-dose chest CT without contrast in 12 months. 2. Aortic atherosclerosis. Coronary artery calcification. 3.  Emphysema.   Labs:     Component Value Date/Time   NA 142 09/12/2023 1045   K 4.7 09/12/2023 1045   CL 104 09/12/2023 1045   CO2 21 09/12/2023 1045   GLUCOSE 112 (H) 09/12/2023 1045   BUN 20 09/12/2023 1045   CREATININE 0.61 09/12/2023 1045   CALCIUM 9.3 09/12/2023 1045   PROT 7.0 09/12/2023 1045   ALBUMIN 4.5 11/06/2016 0942   AST 28 09/12/2023 1045   ALT 35 (H) 09/12/2023 1045   ALKPHOS 84  11/06/2016 0942   BILITOT 0.4 09/12/2023 1045   GFRNONAA >60 10/04/2022 1912   GFRNONAA 100 08/30/2020 1235   GFRAA 116 08/30/2020 1235    Lab Results  Component Value Date   WBC 5.4 09/12/2023   HGB 14.7 09/12/2023   HCT 43.5 09/12/2023   MCV 98.0 09/12/2023   PLT 287 09/12/2023   Lab Results  Component Value Date   CHOL 151 09/12/2023   HDL 47 (L) 09/12/2023   LDLCALC 78 09/12/2023   TRIG 160 (H) 09/12/2023   CHOLHDL 3.2 09/12/2023   Lab Results  Component Value Date   HGBA1C 6.3 (H) 09/12/2023    Lab Results  Component Value Date   TSH 1.46 09/12/2023    Assessment & Plan:  Other Labs Reviewed today: CBC, compared to 10/04/22: MCH 33.1, decreased from 33.2 but elevated above current normal limits.  CMP, compared to 3/7 & 03/19/2023: Blood Glucose 112, no change from 09/2022; ALT 35, decreased from 36 but still elevated; otherwise WNL.  Microalbumin/Creatinine: Microalb/Creat Ratio 229; otherwise WNL TSH: 1.46  Right Leg Numbness has improved, onset following her surgical decompression of her lumbar spine on 09/20/2022, which did become infected and required hospitalization on 10/04/2022 for surgical debridement.  Constipation, does use Miralax daily, but does still have issues with straining. Recommended to increase Miralax until having regular, soft BMs or can use Magnesium Citrate if constipation is severe.  Former Smoker 1 PPD x50 years with Fhx of Lung Cancer (Father) quit smoking last year following her  spinal procedure. Lung Cancer Screening 06/28/2023 with aortic atherosclerosis; coronary artery calcification, emphysema, and negative result for cancer. Repeat screening recommended for 05/2024.   States she had a hearing test 2 weeks ago, which she reports results noting a slight Decreased Hearing in her one ear.   Hypertension treated with Amlodipine 10 mg daily, Metoprolol succinate 50 mg daily with or immediately following a meal, and Olmesartan 40 mg daily.Blood  Pressure: normotensive today at 110/80.   Dependent Edema treated with Lasix 40 mg daily.   Hypokalemia treated with KLOR-CON M20 20 MEQ 3x daily.   GERD treated with Pantoprazole 40 mg daily.  Hyperlipidemia treated with Simvastatin 40 mg daily at bedtime. 09/12/2023 Lipid Panel, compared to 03/19/23: HDL 47, no change; TRIG 160, elevated from 117; otherwise WNL.   Diabetes Mellitus, type II currently no managed with medication. 09/12/2023 HgbA1c, compared to 03/19/23: 6.3, elevated from 6.1; Blood Glucose 112, no change from 09/2022.   Gout treated with Allopurinol 300 mg daily.  Nerve Pain treated with Neurontin 800 mg twice daily as needed.  Muscle Spasms treated with 5 mg Flexeril as needed.  Back Pain treated with 5 mg Valium as needed.  Elevated Liver Function 09/12/2023 ALT 35, decreased from 36 on 03/19/23 but still elevated. She questioned whether elevation could be attributed some of her medications, endorses daily Hydrocodone-Acetaminophen usage to manage her chronic musculoskeletal pain. Denies alcohol consumption.   Overdue PAP Smear since 2024. Last completed 06/09/20 with results negative for intraepithelial lesion or malignancy.   Mammogram 08/01/2022 normal with repeat recommendation of 2025.   Colonoscopy never done, has been discussed previously. Reports having done a colo-guard, which was negative according to her though there is no record in EPIC of this being ordered or any results.   Bone Density 11/16/2009 T-score Left Femur Neck -0.4, normal. Repeat ordered 02/2023 has not been completed yet.   Vaccine Counseling: Due for Flu and Shingles 1/2 - updated Flu today; UTD on PNA and Tdap.  Return Thursday August 14th for labs and then Tuesday August 19th at 2:30 PM for 6 month follow-up, or as needed!   Annual wellness visit done today including the all of the following: Reviewed patient's Family Medical History Reviewed and updated list of patient's medical  providers Assessment of cognitive impairment was done Assessed patient's functional ability Established a written schedule for health screening services Health Risk Assessent Completed and Reviewed  Discussed health benefits of physical activity, and encouraged her to engage in regular exercise appropriate for her age and condition.    I,Emily Lagle,acting as a Neurosurgeon for Margaree Mackintosh, MD.,have documented all relevant documentation on the behalf of Margaree Mackintosh, MD,as directed by  Margaree Mackintosh, MD while in the presence of Margaree Mackintosh, MD.   I, Margaree Mackintosh, MD, have reviewed all documentation for this visit. The documentation on 09/26/23 for the exam, diagnosis, procedures, and orders are all accurate and complete.

## 2023-09-16 ENCOUNTER — Ambulatory Visit: Payer: Medicare Other | Admitting: Internal Medicine

## 2023-09-16 ENCOUNTER — Encounter: Payer: Self-pay | Admitting: Internal Medicine

## 2023-09-16 VITALS — BP 110/80 | HR 68 | Ht 63.0 in | Wt 225.0 lb

## 2023-09-16 DIAGNOSIS — K219 Gastro-esophageal reflux disease without esophagitis: Secondary | ICD-10-CM | POA: Diagnosis not present

## 2023-09-16 DIAGNOSIS — Z8659 Personal history of other mental and behavioral disorders: Secondary | ICD-10-CM

## 2023-09-16 DIAGNOSIS — Z6839 Body mass index (BMI) 39.0-39.9, adult: Secondary | ICD-10-CM

## 2023-09-16 DIAGNOSIS — Z87891 Personal history of nicotine dependence: Secondary | ICD-10-CM | POA: Diagnosis not present

## 2023-09-16 DIAGNOSIS — I10 Essential (primary) hypertension: Secondary | ICD-10-CM | POA: Diagnosis not present

## 2023-09-16 DIAGNOSIS — J432 Centrilobular emphysema: Secondary | ICD-10-CM

## 2023-09-16 DIAGNOSIS — Z0000 Encounter for general adult medical examination without abnormal findings: Secondary | ICD-10-CM

## 2023-09-16 DIAGNOSIS — E119 Type 2 diabetes mellitus without complications: Secondary | ICD-10-CM

## 2023-09-16 DIAGNOSIS — Z8711 Personal history of peptic ulcer disease: Secondary | ICD-10-CM

## 2023-09-16 DIAGNOSIS — E781 Pure hyperglyceridemia: Secondary | ICD-10-CM

## 2023-09-16 DIAGNOSIS — K5901 Slow transit constipation: Secondary | ICD-10-CM

## 2023-09-16 DIAGNOSIS — Z23 Encounter for immunization: Secondary | ICD-10-CM | POA: Diagnosis not present

## 2023-09-16 DIAGNOSIS — R609 Edema, unspecified: Secondary | ICD-10-CM

## 2023-09-16 DIAGNOSIS — Z981 Arthrodesis status: Secondary | ICD-10-CM

## 2023-09-16 DIAGNOSIS — Z8739 Personal history of other diseases of the musculoskeletal system and connective tissue: Secondary | ICD-10-CM

## 2023-09-16 LAB — POCT URINALYSIS DIP (CLINITEK)
Bilirubin, UA: NEGATIVE
Blood, UA: NEGATIVE
Glucose, UA: NEGATIVE mg/dL
Ketones, POC UA: NEGATIVE mg/dL
Leukocytes, UA: NEGATIVE
Nitrite, UA: NEGATIVE
POC PROTEIN,UA: NEGATIVE
Spec Grav, UA: 1.015 (ref 1.010–1.025)
Urobilinogen, UA: 0.2 U/dL
pH, UA: 6 (ref 5.0–8.0)

## 2023-09-26 ENCOUNTER — Encounter: Payer: Self-pay | Admitting: Internal Medicine

## 2023-09-26 NOTE — Patient Instructions (Addendum)
 It was a pleasure to see you today. Labs are stable. Continue to work on diet and exercise regimen. No change in medications.Flu vaccine given. RTC in August for 6 month follow up and fasting labs.

## 2023-09-30 ENCOUNTER — Other Ambulatory Visit: Payer: Self-pay | Admitting: Internal Medicine

## 2023-10-25 ENCOUNTER — Other Ambulatory Visit: Payer: Self-pay | Admitting: Internal Medicine

## 2023-10-25 DIAGNOSIS — Z0000 Encounter for general adult medical examination without abnormal findings: Secondary | ICD-10-CM

## 2023-10-28 ENCOUNTER — Ambulatory Visit
Admission: RE | Admit: 2023-10-28 | Discharge: 2023-10-28 | Disposition: A | Discharge status: Home / Self Care | Source: Ambulatory Visit | Attending: Internal Medicine | Admitting: Internal Medicine

## 2023-10-28 DIAGNOSIS — Z0000 Encounter for general adult medical examination without abnormal findings: Secondary | ICD-10-CM

## 2023-12-30 ENCOUNTER — Other Ambulatory Visit: Payer: Self-pay | Admitting: Internal Medicine

## 2024-01-09 ENCOUNTER — Telehealth: Payer: Self-pay | Admitting: Internal Medicine

## 2024-01-09 NOTE — Progress Notes (Addendum)
 Patient Care Team: Sylvan Evener, MD as PCP - General  Visit Date: 01/09/24  Subjective:  No chief complaint on file.  There were no vitals filed for this visit. Patient ZO:XWRUEA Gabriella, [name] DOB:1955/08/12,67 y.o. VWU:[id number]   68 y.o.Female presents today for acute visit due to onset a couple of days ago with blood in stool associated with constipation. Never had hemorrhoids to her knowledge. Never had screening colonoscopy although has been discussed with her previously.  Had annual Medicare wellness visit  in August 2024. Patient has a past medical history of type 2 diabetes mellitus, hypertension, hyperlipidemia, hypokalemia secondary to diuretic therapy, obesity.  History of GE reflux treated with pantoprazole  40 mg daily.  History of dependent edema treated with diuretic.  History of gout treated with allopurinol .  Past medical history: Had lumbar wound debridement March 2024 with Dr. Ellery Guthrie.  Takes MiraLAX  for constipation.  History of nerve pain treated with Neurontin  as needed.  Has musculoskeletal pain/spasm treated with Flexeril  3 times daily as needed.  Diabetes is diet controlled.  Has not wanted to take medication for it.  Last hemoglobin A1c in February 2025 was 6.3%.  Had CBC in February 2025 that was normal with normal hemoglobin.  Has 102-month recheck appointment scheduled for August.      Past Medical History:  Diagnosis Date   Arthritis    Cancer [facility])    FACIAL CANCER    Dependent edema    Diabetes mellitus    pt reports she was pre-DM in the past but lost weight and is no longer DM   Headache    Hypertension    Hypertriglyceridemia    Hypokalemia    Metrorrhagia    Obesity    Peptic ulcer disease    PONV (postoperative nausea and vomiting)    SUI (stress urinary incontinence, female)    TIA (transient ischemic attack)    I might've had a TIA in summer 2022    No Known Allergies  Family History  Problem Relation Age of Onset    Hypertension Mother    Dementia Mother    Lung cancer Father    Breast cancer Neg Hx    Social History   Social History Narrative   Social history: This is her second marriage.  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   Hx of Smoking 1 PPD x50 yrs.  No alcohol consumption. Right-handed.   2 cups caffeine daily.       Family history: Mother died with complications of dementia with history of severe hypertension.  Father deceased with lung cancer.  Time of 5 brothers.  2 brothers have heart murmurs.       ROS   Objective:    Physical Exam  Results:  Studies Obtained And Personally Reviewed By Me:    Labs:     Component Value Date/Time   NA 142 09/12/2023 1045   K 4.7 09/12/2023 1045   CL 104 09/12/2023 1045   CO2 21 09/12/2023 1045   GLUCOSE 112 (H) 09/12/2023 1045   BUN 20 09/12/2023 1045   CREATININE 0.61 09/12/2023 1045   CALCIUM 9.3 09/12/2023 1045   PROT 7.0 09/12/2023 1045   ALBUMIN  4.5 11/06/2016 0942   AST 28 09/12/2023 1045   ALT 35 (H) 09/12/2023 1045   ALKPHOS 84 11/06/2016 0942   BILITOT 0.4 09/12/2023 1045   GFRNONAA >60 10/04/2022 1912   GFRNONAA 100 08/30/2020 1235   GFRAA  116 08/30/2020 1235    Lab Results  Component Value Date   WBC 5.4 09/12/2023   HGB 14.7 09/12/2023   HCT 43.5 09/12/2023   MCV 98.0 09/12/2023   PLT 287 09/12/2023   Lab Results  Component Value Date   CHOL 151 09/12/2023   HDL 47 (L) 09/12/2023   LDLCALC 78 09/12/2023   TRIG 160 (H) 09/12/2023   CHOLHDL 3.2 09/12/2023   Lab Results  Component Value Date   HGBA1C 6.3 (H) 09/12/2023    Lab Results  Component Value Date   TSH 1.46 09/12/2023     No results found for any visits on 01/10/24. Assessment & Plan:   New onset acute rectal bleeding associated with constipation.  Has never had screening colonoscopy.  Anoscopy was performed today and showed small internal varicosities that were not actively bleeding and stool  was guaiac negative.  Plan: Patient says rectal bleeding has resolved.  Make referral to [facility] for screening colonoscopy.  Avoid NSAIDs and aspirin for now if possible.  May take Tylenol  for pain.  May take MiraLAX  daily for constipation.        I,Emily Lagle,acting as a Neurosurgeon for Sylvan Evener, MD.,have documented all relevant documentation on the behalf of Sylvan Evener, MD,as directed by  Sylvan Evener, MD while in the presence of Sylvan Evener, MD.   I, Sylvan Evener, MD, have reviewed all documentation for this visit. The documentation on 01/10/24 for the exam, diagnosis, procedures, and orders are all accurate and complete.

## 2024-01-09 NOTE — Telephone Encounter (Signed)
Patient is scheduled for tomorrow at 2:30 pm.

## 2024-01-10 ENCOUNTER — Encounter: Payer: Self-pay | Admitting: Internal Medicine

## 2024-01-10 ENCOUNTER — Ambulatory Visit: Admitting: Internal Medicine

## 2024-01-10 VITALS — BP 118/64 | HR 64 | Temp 98.6°F | Ht 62.5 in | Wt 229.8 lb

## 2024-01-10 DIAGNOSIS — Z981 Arthrodesis status: Secondary | ICD-10-CM

## 2024-01-10 DIAGNOSIS — E119 Type 2 diabetes mellitus without complications: Secondary | ICD-10-CM

## 2024-01-10 DIAGNOSIS — I10 Essential (primary) hypertension: Secondary | ICD-10-CM

## 2024-01-10 DIAGNOSIS — K219 Gastro-esophageal reflux disease without esophagitis: Secondary | ICD-10-CM

## 2024-01-10 DIAGNOSIS — K625 Hemorrhage of anus and rectum: Secondary | ICD-10-CM | POA: Diagnosis not present

## 2024-01-10 DIAGNOSIS — Z1211 Encounter for screening for malignant neoplasm of colon: Secondary | ICD-10-CM

## 2024-01-10 DIAGNOSIS — Z8659 Personal history of other mental and behavioral disorders: Secondary | ICD-10-CM

## 2024-01-10 DIAGNOSIS — K5901 Slow transit constipation: Secondary | ICD-10-CM

## 2024-01-10 NOTE — Patient Instructions (Addendum)
 You have some mild internal hemorrhoids. No active bleeding on anoscopy today. Hx of constipation Advise taking daily Miralax  to prevent constipation. Have made referral to [facility] for screening colonoscopy. Has never had one. Medicare wellness visit due in August.

## 2024-01-28 LAB — COLOGUARD: COLOGUARD: NEGATIVE

## 2024-01-29 LAB — COLOGUARD: Cologuard: NEGATIVE

## 2024-02-10 ENCOUNTER — Telehealth: Payer: Self-pay | Admitting: Internal Medicine

## 2024-02-11 ENCOUNTER — Other Ambulatory Visit: Payer: Medicare Other

## 2024-02-21 ENCOUNTER — Other Ambulatory Visit: Payer: Self-pay | Admitting: Internal Medicine

## 2024-03-12 ENCOUNTER — Other Ambulatory Visit: Payer: Medicare Other

## 2024-03-12 DIAGNOSIS — I10 Essential (primary) hypertension: Secondary | ICD-10-CM

## 2024-03-12 DIAGNOSIS — E119 Type 2 diabetes mellitus without complications: Secondary | ICD-10-CM

## 2024-03-12 DIAGNOSIS — E781 Pure hyperglyceridemia: Secondary | ICD-10-CM

## 2024-03-13 ENCOUNTER — Ambulatory Visit: Payer: Self-pay | Admitting: Internal Medicine

## 2024-03-13 LAB — HEPATIC FUNCTION PANEL
AG Ratio: 1.4 (calc) (ref 1.0–2.5)
ALT: 41 U/L — ABNORMAL HIGH (ref 6–29)
AST: 32 U/L (ref 10–35)
Albumin: 4.2 g/dL (ref 3.6–5.1)
Alkaline phosphatase (APISO): 73 U/L (ref 37–153)
Bilirubin, Direct: 0.1 mg/dL (ref 0.0–0.2)
Globulin: 2.9 g/dL (ref 1.9–3.7)
Indirect Bilirubin: 0.4 mg/dL (ref 0.2–1.2)
Total Bilirubin: 0.5 mg/dL (ref 0.2–1.2)
Total Protein: 7.1 g/dL (ref 6.1–8.1)

## 2024-03-13 LAB — LIPID PANEL
Cholesterol: 148 mg/dL (ref <200)
HDL: 39 mg/dL — ABNORMAL LOW (ref >=50)
LDL Cholesterol (Calc): 78 mg/dL
Non-HDL Cholesterol (Calc): 109 mg/dL (ref <130)
Total CHOL/HDL Ratio: 3.8 (calc) (ref <5.0)
Triglycerides: 225 mg/dL — ABNORMAL HIGH (ref <150)

## 2024-03-13 LAB — MICROALBUMIN / CREATININE URINE RATIO
Creatinine, Urine: 53 mg/dL (ref 20–275)
Microalb Creat Ratio: 94 mg/g{creat} — ABNORMAL HIGH (ref <30)
Microalb, Ur: 5 mg/dL

## 2024-03-13 LAB — HEMOGLOBIN A1C
Hgb A1c MFr Bld: 6.4 % — ABNORMAL HIGH (ref <5.7)
Mean Plasma Glucose: 137 mg/dL
eAG (mmol/L): 7.6 mmol/L

## 2024-03-17 ENCOUNTER — Ambulatory Visit: Payer: Medicare Other | Admitting: Internal Medicine

## 2024-03-19 ENCOUNTER — Ambulatory Visit (INDEPENDENT_AMBULATORY_CARE_PROVIDER_SITE_OTHER): Admitting: Internal Medicine

## 2024-03-19 ENCOUNTER — Encounter: Payer: Self-pay | Admitting: Internal Medicine

## 2024-03-19 VITALS — BP 130/80 | HR 74 | Ht 62.5 in

## 2024-03-19 DIAGNOSIS — Z8711 Personal history of peptic ulcer disease: Secondary | ICD-10-CM

## 2024-03-19 DIAGNOSIS — K219 Gastro-esophageal reflux disease without esophagitis: Secondary | ICD-10-CM

## 2024-03-19 DIAGNOSIS — E786 Lipoprotein deficiency: Secondary | ICD-10-CM

## 2024-03-19 DIAGNOSIS — E119 Type 2 diabetes mellitus without complications: Secondary | ICD-10-CM

## 2024-03-19 DIAGNOSIS — Z87891 Personal history of nicotine dependence: Secondary | ICD-10-CM | POA: Diagnosis not present

## 2024-03-19 DIAGNOSIS — Z8659 Personal history of other mental and behavioral disorders: Secondary | ICD-10-CM

## 2024-03-19 DIAGNOSIS — G8929 Other chronic pain: Secondary | ICD-10-CM

## 2024-03-19 DIAGNOSIS — I10 Essential (primary) hypertension: Secondary | ICD-10-CM | POA: Diagnosis not present

## 2024-03-19 DIAGNOSIS — Z6841 Body Mass Index (BMI) 40.0 and over, adult: Secondary | ICD-10-CM

## 2024-03-19 DIAGNOSIS — Z780 Asymptomatic menopausal state: Secondary | ICD-10-CM

## 2024-03-19 DIAGNOSIS — J432 Centrilobular emphysema: Secondary | ICD-10-CM

## 2024-03-19 DIAGNOSIS — M7918 Myalgia, other site: Secondary | ICD-10-CM

## 2024-03-19 DIAGNOSIS — R609 Edema, unspecified: Secondary | ICD-10-CM

## 2024-03-19 NOTE — Progress Notes (Addendum)
 Patient Care Team: Perri Ronal PARAS, MD as PCP - General  Visit Date: 03/19/24  Subjective:   Chief Complaint  Patient presents with   Hypertension   Diabetes   Patient PI:Gabriella [name], Evelyn DOB:03/06/1956,68 y.o. FMW:[id number]   68 y.o.Female presents today for 6 months follow-up for Hypertension; Diabetes. Patient has a past medical history of Hyperlipidemia; Hypothyroidism; Former Smoker. Seen for annual visit on 09/16/2023, in the interim has had her Colo-guard 01/29/2024, which was negative, and has been seen at [facility].  History of Hypertension treated with Metoprolol -XL 50 mg daily, Olmesartan  40 mg daily, Amlodipine  10 mg daily; Dependent Edema treated with Lasix  40 mg daily. Blood Pressure: normotensive today at 130/80.   History of Hyperlipidemia treated with Simvastatin  40 mg daily. 03/12/2024 Lipid Panel, compared to 02/2023: HDL 39, decreased from 47; Triglycerides 225, elevated from 117; otherwise WNL.   History of Diabetes Mellitus with 03/12/2024 HgbA1c 6.4 and Albumin  WNL at 5.0. Eye exam 01/02/2023 did not detect diabetic retinopathy. Since 02/2023, when she weighed 291 lbs, she has gained 10 pounds and today weighs 229 lbsBMI 41.36.  History of Smoking, quit 08/2022 following her PLIF procedure. ~14 pack years. Lung Cancer Screening 05/2023 negative.  Bone Density last completed 11/16/2009 T-score Left Femur Neck -0.4, normal. Repeat discussed, which she is agreeable to doing, so placing order. Past Medical History:  Diagnosis Date   Arthritis    Cancer (HCC)    FACIAL CANCER    Dependent edema    Diabetes mellitus    pt reports she was pre-DM in the past but lost weight and is no longer DM   Headache    Hypertension    Hypertriglyceridemia    Hypokalemia    Metrorrhagia    Obesity    Peptic ulcer disease    PONV (postoperative nausea and vomiting)    SUI (stress urinary incontinence, female)    TIA (transient ischemic attack)     I might've had a TIA in summer 2022    No Known Allergies Immunization History  Administered Date(s) Administered   Influenza Split 07/14/2012   Influenza, Seasonal, Injecte, Preservative Fre 09/16/2023   Influenza,inj,Quad PF,6+ Mos 06/09/2019, 06/09/2020, 09/13/2022   PNEUMOCOCCAL CONJUGATE-20 09/17/2022   Pneumococcal Conjugate-13 07/20/2015   Pneumococcal Polysaccharide-23 11/27/2009   Tdap 10/14/2001, 01/07/2012, 09/21/2021   Past Surgical History:  Procedure Laterality Date   ANTERIOR CRUCIATE LIGAMENT REPAIR     BILATERAL  92+95   arthroscopic knee surgery  05/2003   right knee   CYST EXCISION     OVARY      LUMBAR WOUND DEBRIDEMENT N/A 10/04/2022   Procedure: LUMBAR WOUND DEBRIDEMENT;  Surgeon: Colon Shove, MD;  Location: [facility] OR;  Service: Neurosurgery;  Laterality: N/A;   NECK SURGERY  07/05/2021   perforated duodenal ulcer  10/2006   RADIOACTIVE SEED IMPLANT     fluid drained from right breast - pt denies having radioactive seed placed   REVERSE SHOULDER ARTHROPLASTY Right 07/05/2016   Procedure: REVERSE SHOULDER ARTHROPLASTY;  Surgeon: Franky Pointer, MD;  Location: [facility] OR;  Service: Orthopedics;  Laterality: Right;   TUBAL LIGATION     68 yo    Family History  Problem Relation Age of Onset   Hypertension Mother    Dementia Mother    Lung cancer Father    Breast cancer Neg Hx    Social History   Social History Narrative   Social history: This is her second marriage.  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  They have chickens and seasonal vegetables.   Hx of Smoking 1 PPD x50 yrs.  No alcohol consumption. Right-handed.   2 cups caffeine daily.       Family history: Mother died with complications of dementia with history of severe hypertension.  Father deceased with lung cancer.  Time of 5 brothers.  2 brothers have heart murmurs.       Review of Systems  Constitutional:  Negative for fever and malaise/fatigue.  HENT:  Negative for congestion.   Eyes:   Negative for blurred vision.  Respiratory:  Negative for cough and shortness of breath.   Cardiovascular:  Negative for chest pain, palpitations and leg swelling.  Gastrointestinal:  Negative for vomiting.  Musculoskeletal:  Negative for back pain.  Skin:  Negative for rash.  Neurological:  Negative for loss of consciousness and headaches.     Objective:  Vitals: BP 130/80   Pulse 74   Ht 5' 2.5 (1.588 m)   SpO2 97%   BMI 41.36 kg/m   Physical Exam Vitals and nursing note reviewed.  Constitutional:      General: She is not in acute distress.    Appearance: Normal appearance. She is not toxic-appearing.  HENT:     Head: Normocephalic and atraumatic.  Cardiovascular:     Heart sounds: Murmur heard.     Systolic murmur is present with a grade of 1/6.  Pulmonary:     Effort: Pulmonary effort is normal.  Skin:    General: Skin is warm and dry.  Neurological:     Mental Status: She is alert and oriented to person, place, and time. Mental status is at baseline.  Psychiatric:        Mood and Affect: Mood normal.        Behavior: Behavior normal.        Thought Content: Thought content normal.        Judgment: Judgment normal.     Results:  Studies Obtained And Personally Reviewed By Me:  Diabetic Foot Exam - Simple   Simple Foot Form Diabetic Foot exam was performed with the following findings: Yes 03/19/2024  3:05 PM  Visual Inspection No deformities, no ulcerations, no other skin breakdown bilaterally: Yes Sensation Testing Intact to touch and monofilament testing bilaterally: Yes Pulse Check Posterior Tibialis and Dorsalis pulse intact bilaterally: Yes Comments    CT CHEST WITHOUT CONTRAST LOW-DOSE FOR LUNG CANCER SCREENING 06/28/2023  FINDINGS: Cardiovascular: Atherosclerotic calcification of the aorta, aortic valve and coronary arteries. Heart is at the upper limits of normal in size to mildly enlarged. No pericardial effusion.   Mediastinum/Nodes: No  pathologically enlarged mediastinal or axillary lymph nodes. Hilar regions are difficult to definitively evaluate without IV contrast. Esophagus is grossly unremarkable.   Lungs/Pleura: Centrilobular emphysema. Smoking related respiratory bronchiolitis. No suspicious pulmonary nodules. No pleural fluid. Airway is unremarkable.   Upper Abdomen: Visualized portions of the liver, gallbladder, adrenal glands, kidneys, spleen, pancreas, stomach and bowel are grossly unremarkable. No upper abdominal adenopathy.   Musculoskeletal: Degenerative changes in the spine.   IMPRESSION: 1. Lung-RADS 1, negative. Continue annual screening with low-dose chest CT without contrast in 12 months. 2. Aortic atherosclerosis (ICD10-I70.0). Coronary artery calcification. 3.  Emphysema (ICD10-J43.9).   Labs:  CBC w/ Differential Lab Results  Component Value Date   WBC 5.4 09/12/2023   RBC 4.44 09/12/2023   HGB 14.7 09/12/2023   HCT 43.5 09/12/2023   PLT 287 09/12/2023   MCV  98.0 09/12/2023   MCH 33.1 (H) 09/12/2023   MCHC 33.8 09/12/2023   RDW 12.3 09/12/2023   MPV 9.6 09/12/2023   LYMPHSABS 2.3 10/04/2022   MONOABS 0.9 10/04/2022   BASOSABS 38 09/12/2023    Comprehensive Metabolic Panel Lab Results  Component Value Date   NA 142 09/12/2023   K 4.7 09/12/2023   CL 104 09/12/2023   CO2 21 09/12/2023   GLUCOSE 112 (H) 09/12/2023   BUN 20 09/12/2023   CREATININE 0.61 09/12/2023   CALCIUM 9.3 09/12/2023   PROT 7.1 03/12/2024   ALBUMIN  4.5 11/06/2016   AST 32 03/12/2024   ALT 41 (H) 03/12/2024   ALKPHOS 84 11/06/2016   BILITOT 0.5 03/12/2024   EGFR 99 09/12/2023   GFRNONAA >60 10/04/2022   Lipid Panel  Lab Results  Component Value Date   CHOL 148 03/12/2024   HDL 39 (L) 03/12/2024   LDLCALC 78 03/12/2024   TRIG 225 (H) 03/12/2024   A1c Lab Results  Component Value Date   HGBA1C 6.4 (H) 03/12/2024    TSH Lab Results  Component Value Date   TSH 1.46 09/12/2023   Assessment  & Plan:   Orders Placed This Encounter  Procedures   DG Bone Density    Please call patient to schedule    Reason for Exam (SYMPTOM  OR DIAGNOSIS REQUIRED):   bone density    Preferred imaging location?:   [facility]    Call Results- Best Contact Number?:   [phone] Patient   Ambulatory referral to Endocrinology    Referral Priority:   Routine    Referral Type:   Consultation    Referral Reason:   Specialty Services Required    Number of Visits Requested:   1   Hypertension treated with Metoprolol -XL 50 mg daily, Olmesartan  40 mg daily, Amlodipine  10 mg daily. Blood Pressure: normotensive today at 130/80.   Dependent Edema treated with Lasix  40 mg daily. Also helps with HTN control.  GERD treated with PPI  Hx of gout treated with Allopurinol   BMI 41- discussion regarding diet and exercise. At least try to walk some daily.  Musculoskeletal pain- encourage daily walking. Takes gabapentin .   Hyperlipidemia treated with Simvastatin  40 mg daily. 03/12/2024 Lipid Panel, compared to 02/2023: HDL 39, decreased from 47; Triglycerides 225, elevated from 117; otherwise WNL.   Diabetes Mellitus with 03/12/2024 HgbA1c 6.4 and Albumin  WNL at 5.0. Eye exam 01/02/2023 did not detect diabetic retinopathy. Since 02/2023, when she weighed 291 lbs, she has gained 10 pounds and today weighs 229 lbsBMI 41.36.  Referring to Endocrinology. She is interested in GLP-1 meds.  History of Smoking, quit 08/2022 following her PLIF procedure. ~14 pack years. Lung Cancer Screening 05/2023 negative.  Bone Density last completed 11/16/2009 T-score Left Femur Neck -0.4, normal.  Repeat discussed, which she is agreeable to doing, so placing order.  Return on 09/17/2024 for annual labs, and then on 09/21/2024 for annual visit, or as needed.   I,Emily Lagle,acting as a Neurosurgeon for Ronal JINNY Hailstone, MD.,have documented all relevant documentation on the behalf of Ronal JINNY Hailstone, MD,as directed by  Ronal JINNY Hailstone, MD  while in the presence of Ronal JINNY Hailstone, MD.  I, Ronal JINNY Hailstone, MD, have reviewed all documentation for this visit. The documentation on 03/19/2024 for the exam, diagnosis, procedures, and orders are all accurate and complete.

## 2024-03-27 NOTE — Patient Instructions (Addendum)
 It was a pleasure to see you today. Continue to work on diet and exercise. Continue Allopurinol  to prevent attacks of gout. Blood pressure is under good control on current multidrug regimen. Glad you quit smoking in 2024.Suggest regular screening for lung cancer. Next scan due November 2025.Bone density study ordered.  Referring to Endocrinology as she is interested in GLP-1 meds

## 2024-04-22 ENCOUNTER — Other Ambulatory Visit: Payer: Self-pay | Admitting: Internal Medicine

## 2024-05-26 ENCOUNTER — Other Ambulatory Visit: Payer: Self-pay | Admitting: Internal Medicine

## 2024-06-18 ENCOUNTER — Ambulatory Visit: Admitting: Internal Medicine

## 2024-06-18 ENCOUNTER — Encounter: Payer: Self-pay | Admitting: Internal Medicine

## 2024-06-18 VITALS — BP 130/80 | HR 75 | Temp 98.0°F | Ht 62.5 in | Wt 232.8 lb

## 2024-06-18 DIAGNOSIS — R1032 Left lower quadrant pain: Secondary | ICD-10-CM | POA: Diagnosis not present

## 2024-06-18 DIAGNOSIS — M7918 Myalgia, other site: Secondary | ICD-10-CM

## 2024-06-18 DIAGNOSIS — Z981 Arthrodesis status: Secondary | ICD-10-CM | POA: Diagnosis not present

## 2024-06-18 DIAGNOSIS — I10 Essential (primary) hypertension: Secondary | ICD-10-CM

## 2024-06-18 DIAGNOSIS — E119 Type 2 diabetes mellitus without complications: Secondary | ICD-10-CM

## 2024-06-18 MED ORDER — METHYLPREDNISOLONE 4 MG PO TABS: ORAL_TABLET | ORAL | 0 refills | Status: AC

## 2024-06-18 NOTE — Progress Notes (Addendum)
 Patient Care Team: Perri Ronal PARAS, MD as PCP - General  Visit Date: 06/18/24  Subjective:    Patient ID: Gabriella [name] , Female   DOB: 08/05/1955, 68 y.o.    MRN: [id number]   68 y.o. Female presents today for Left leg pain. Patient has a past medical history of Hypertension, Hyperlipidemia, Diabetes Mellitus, Type II.  She has been experiencing left groin pain. She said it started two weeks ago and it makes it difficult for her to walk. She denies falling or doing any strenuous activity that would create pain in that area. She has not been taking OTC pain medications. She already takes Hydrocodone -Acetaminophen  10/325 sparingly for back pain.  She is open to trying physical therapy.   Hx of  possible TIA 2022, hx cervical spine surgery 2022, HTN, obesity, hx PUD, HTN,hypertriglyceridemia. 3 level lumbar surgery 2024.  Past Medical History:  Diagnosis Date   Arthritis    Cancer (HCC)    FACIAL CANCER    Dependent edema    Diabetes mellitus    pt reports she was pre-DM in the past but lost weight and is no longer DM   Headache    Hypertension    Hypertriglyceridemia    Hypokalemia    Metrorrhagia    Obesity    Peptic ulcer disease    PONV (postoperative nausea and vomiting)    SUI (stress urinary incontinence, female)    TIA (transient ischemic attack)    I might've had a TIA in summer 2022     Family History  Problem Relation Age of Onset   Hypertension Mother    Dementia Mother    Lung cancer Father    Breast cancer Neg Hx     Social Hx: married. 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 Former smoker.     Review of Systems  Musculoskeletal:        Leg pain    Clearly uncomfortable on exam table today with left groin pain wincing with movement of left leg     Objective:   Vitals: BP 124/86   Pulse 75   Temp 98 F (36.7 C) (Tympanic)   Ht 5' 2.5 (1.588 m)   Wt 232 lb 12.8 oz  (105.6 kg)   SpO2 98%   BMI 41.90 kg/m    Physical Exam Musculoskeletal:     Left upper leg: Tenderness present.     Comments: Tenderness upon palpation.        Results:   Labs:       Component Value Date/Time   NA 142 09/12/2023 1045   K 4.7 09/12/2023 1045   CL 104 09/12/2023 1045   CO2 21 09/12/2023 1045   GLUCOSE 112 (H) 09/12/2023 1045   BUN 20 09/12/2023 1045   CREATININE 0.61 09/12/2023 1045   CALCIUM 9.3 09/12/2023 1045   PROT 7.1 03/12/2024 0957   ALBUMIN  4.5 11/06/2016 0942   AST 32 03/12/2024 0957   ALT 41 (H) 03/12/2024 0957   ALKPHOS 84 11/06/2016 0942   BILITOT 0.5 03/12/2024 0957   GFRNONAA >60 10/04/2022 1912   GFRNONAA 100 08/30/2020 1235   GFRAA 116 08/30/2020 1235     Lab Results  Component Value Date   WBC 5.4 09/12/2023   HGB 14.7 09/12/2023   HCT 43.5 09/12/2023   MCV 98.0 09/12/2023   PLT 287 09/12/2023    Lab Results  Component Value  Date   CHOL 148 03/12/2024   HDL 39 (L) 03/12/2024   LDLCALC 78 03/12/2024   TRIG 225 (H) 03/12/2024   CHOLHDL 3.8 03/12/2024    Lab Results  Component Value Date   HGBA1C 6.4 (H) 03/12/2024     Lab Results  Component Value Date   TSH 1.46 09/12/2023        Assessment & Plan:   Orders Placed This Encounter  Procedures   DG Hip Unilat W OR W/O Pelvis 2-3 Views Left    Standing Status:   Future    Expiration Date:   06/18/2025    Reason for Exam (SYMPTOM  OR DIAGNOSIS REQUIRED):   left groin pain    Preferred imaging location?:   [facility]   Ambulatory referral to Orthopedic Surgery    Referral Priority:   Routine    Referral Type:   Surgical    Referral Reason:   Specialty Services Required    Referred to Provider:   Melodi Lerner, MD    Requested Specialty:   Orthopedic Surgery    Number of Visits Requested:   1   Meds ordered this encounter  Medications   methylPREDNISolone  (MEDROL ) 4 MG tablet    Sig: Take in tapering course as directed 6-5-4-3-2-1    Dispense:  21  tablet    Refill:  0     Left Groin Pain: She has been experiencing left groin pain.Suspect acute injury or osteoarthritis of left hip.  She said it started two weeks ago and it makes it difficult for her to walk. She denies falling or doing any strenuous activity that would cause her pain in that area. She has not been taking OTC med-she already takes Hydrocodone -Acetaminophen  for her back pain . She is open to  trying physical therapy.  Plan:  Medrol  4 mg  tablets to take in tapering course as directed 6-5-4-3-2-1 prescribed.   Referred to Orthopedic Surgery. May have left hip osteoarthritis  Left Hip X-ray ordered.              Consider physical therapy  I,Makayla C Reid,acting as a scribe for Ronal JINNY Hailstone, MD.,have documented all relevant documentation on the behalf of Ronal JINNY Hailstone, MD,as directed by  Ronal JINNY Hailstone, MD while in the presence of Ronal JINNY Hailstone, MD.    I, Ronal JINNY Hailstone, MD, have reviewed all documentation for this visit. The documentation on 06/18/2024 for the exam, diagnosis, procedures, and orders are all accurate and complete.

## 2024-06-18 NOTE — Patient Instructions (Signed)
 Referral to [facility] for evaluation of left groin/hip pain. Xary of left hip. Consider PT. Take Medrol  dose pack in tapering course and continue hydrocodone  for pain control.

## 2024-06-22 ENCOUNTER — Ambulatory Visit
Admission: RE | Admit: 2024-06-22 | Discharge: 2024-06-22 | Disposition: A | Source: Ambulatory Visit | Attending: Internal Medicine | Admitting: Internal Medicine

## 2024-06-22 DIAGNOSIS — R1032 Left lower quadrant pain: Secondary | ICD-10-CM

## 2024-06-26 ENCOUNTER — Telehealth: Payer: Self-pay | Admitting: Internal Medicine

## 2024-06-26 ENCOUNTER — Ambulatory Visit: Payer: Self-pay | Admitting: Internal Medicine

## 2024-06-26 ENCOUNTER — Encounter: Payer: Self-pay | Admitting: Internal Medicine

## 2024-06-26 NOTE — Telephone Encounter (Signed)
 Patient has called requesting results of Xray ordered November 24th. Will ask  Imaging to read as soon as possible. MJB, MD

## 2024-07-10 NOTE — Telephone Encounter (Signed)
 done

## 2024-07-13 ENCOUNTER — Ambulatory Visit: Admitting: Internal Medicine

## 2024-07-14 ENCOUNTER — Other Ambulatory Visit

## 2024-08-07 ENCOUNTER — Telehealth: Payer: Self-pay | Admitting: Internal Medicine

## 2024-08-10 NOTE — Telephone Encounter (Signed)
 Done

## 2024-08-11 ENCOUNTER — Encounter: Payer: Self-pay | Admitting: Internal Medicine

## 2024-08-11 ENCOUNTER — Ambulatory Visit (INDEPENDENT_AMBULATORY_CARE_PROVIDER_SITE_OTHER): Admitting: Internal Medicine

## 2024-08-11 VITALS — BP 100/80 | HR 95 | Ht 62.5 in | Wt 233.0 lb

## 2024-08-11 DIAGNOSIS — Z981 Arthrodesis status: Secondary | ICD-10-CM

## 2024-08-11 DIAGNOSIS — G8929 Other chronic pain: Secondary | ICD-10-CM

## 2024-08-11 DIAGNOSIS — M549 Dorsalgia, unspecified: Secondary | ICD-10-CM

## 2024-08-11 DIAGNOSIS — R1032 Left lower quadrant pain: Secondary | ICD-10-CM | POA: Diagnosis not present

## 2024-08-11 LAB — HM DIABETES EYE EXAM

## 2024-08-11 NOTE — Progress Notes (Signed)
 "   Patient Care Team: Perri Ronal PARAS, MD as PCP - General  Visit Date: 08/11/2024  Subjective:    Patient ID: Gabriella [name] , Female   DOB: Oct 15, 1955, 69 y.o.    MRN: [id number]   69 y.o. Female presents today for Pain management. Patient has a past medical history of Hypertension, Diabetes Mellitus, Type II, Hyperlipidemia.   History of back pain treated with Hydrocodone - Acetaminophen  10-325 and is seeking for a referral for pain management. Currently seen as by Dr. Orlando at [facility] pain management. Back pain started 20 years ago. Dr. Renie in [place], Who preformed her ACL surgery in 1992 and 1995, noted difficulties with  her back. She has had epidural steroid injections and TENS unit which were only partially helpful. Because of peptic ulcers and chronic hypertension she is unable to take NSAIDS. She says that she takes Hydrocodone - Acetaminophen  10-325 every 6 hours. She also has numbness right leg and right toes. Her strength is good on dorsiflexion of her feet. She requires a cane to walk around.   She was last seen in this office on 06/18/2024 for left groin pain. She had been experiencing left groin pain that makes it difficult for her to walk. She denies falling or doing any strenuous activity that would create pain in that area. She has not been taking OTC pain medications. She already takes Hydrocodone -Acetaminophen  10/325 sparingly for back pain.  She is starting physical therapy on the 20th of January.    Past Medical History:  Diagnosis Date   Arthritis    Cancer (HCC)    FACIAL CANCER    Dependent edema    Diabetes mellitus    pt reports she was pre-DM in the past but lost weight and is no longer DM   Headache    Hypertension    Hypertriglyceridemia    Hypokalemia    Metrorrhagia    Obesity    Peptic ulcer disease    PONV (postoperative nausea and vomiting)    SUI (stress urinary incontinence, female)    TIA (transient ischemic attack)    I might've  had a TIA in summer 2022     Family History  Problem Relation Age of Onset   Hypertension Mother    Dementia Mother    Lung cancer Father    Breast cancer Neg Hx     Social History   Social History Narrative   Social history: This is her second marriage.  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   Hx of Smoking 1 PPD x50 yrs.  No alcohol consumption. Right-handed.   2 cups caffeine daily.       Family history: Mother died with complications of dementia with history of severe hypertension.  Father deceased with lung cancer.  Time of 5 brothers.  2 brothers have heart murmurs.          Review of Systems  Musculoskeletal:  Positive for back pain and joint pain.        Objective:   Vitals: BP 100/80   Pulse 95   Ht 5' 2.5 (1.588 m)   Wt 233 lb (105.7 kg)   SpO2 96%   BMI 41.94 kg/m    Physical Exam Musculoskeletal:     Comments: Strength good on dorsiflexion of feet.        Results:    Labs:       Component Value Date/Time   NA 142 09/12/2023  1045   K 4.7 09/12/2023 1045   CL 104 09/12/2023 1045   CO2 21 09/12/2023 1045   GLUCOSE 112 (H) 09/12/2023 1045   BUN 20 09/12/2023 1045   CREATININE 0.61 09/12/2023 1045   CALCIUM 9.3 09/12/2023 1045   PROT 7.1 03/12/2024 0957   ALBUMIN  4.5 11/06/2016 0942   AST 32 03/12/2024 0957   ALT 41 (H) 03/12/2024 0957   ALKPHOS 84 11/06/2016 0942   BILITOT 0.5 03/12/2024 0957   GFRNONAA >60 10/04/2022 1912   GFRNONAA 100 08/30/2020 1235   GFRAA 116 08/30/2020 1235     Lab Results  Component Value Date   WBC 5.4 09/12/2023   HGB 14.7 09/12/2023   HCT 43.5 09/12/2023   MCV 98.0 09/12/2023   PLT 287 09/12/2023    Lab Results  Component Value Date   CHOL 148 03/12/2024   HDL 39 (L) 03/12/2024   LDLCALC 78 03/12/2024   TRIG 225 (H) 03/12/2024   CHOLHDL 3.8 03/12/2024    Lab Results  Component Value Date   HGBA1C 6.4 (H) 03/12/2024     Lab Results   Component Value Date   TSH 1.46 09/12/2023        Assessment & Plan:  No orders of the defined types were placed in this encounter.   Back pain: treated with Hydrocodone - Acetaminophen  10-325 and is seeking for a referral for pain management. Currently seen by Dr. Orlando at [facility] pain management. Back pain started 20 years ago. Dr. Renie in [place], Who preformed her ACL surgery in 1992 and 1995, noted difficulties with her back. She has had epidural steroid injections and TENS unit which were only partially helpful. Because of peptic ulcers and chronic hypertension she is unable to take NSAIDS. She says that she takes Hydrocodone - Acetaminophen  10-325 every 6 hours. She also has numbness right leg and right toes. Her strength is good on dorsiflexion of her feet. She requires a cane to walk around.    Referred to pain clinic.   Left groin pain: She was last seen in this office on 06/18/2024 for left groin pain. She had been experiencing left groin pain that makes it difficult for her to walk. She denies falling or doing any strenuous activity that would create pain in that area. She has not been taking OTC pain medications. She already takes Hydrocodone -Acetaminophen  10/325 sparingly for back pain.  She is starting physical therapy on the 20th of January.     I,Makayla C Reid,acting as a scribe for Ronal JINNY Hailstone, MD.,have documented all relevant documentation on the behalf of Ronal JINNY Hailstone, MD,as directed by  Ronal JINNY Hailstone, MD while in the presence of Ronal JINNY Hailstone, MD.      "

## 2024-08-13 ENCOUNTER — Encounter: Payer: Self-pay | Admitting: Internal Medicine

## 2024-08-13 NOTE — Patient Instructions (Signed)
 Patient scheduled for medicare wellness visit here in February. She is requesting new referral to [facility] pain management. This has been sent.

## 2024-08-15 ENCOUNTER — Other Ambulatory Visit: Payer: Self-pay | Admitting: Internal Medicine

## 2024-09-03 ENCOUNTER — Other Ambulatory Visit: Payer: Self-pay | Admitting: Internal Medicine

## 2024-09-17 ENCOUNTER — Other Ambulatory Visit: Payer: Self-pay

## 2024-09-21 ENCOUNTER — Ambulatory Visit: Payer: Self-pay | Admitting: Internal Medicine

## 2024-10-07 ENCOUNTER — Ambulatory Visit: Admitting: Endocrinology
